# Patient Record
Sex: Male | Born: 1950
Health system: Southern US, Community
[De-identification: ages and names within clinical notes are randomized; demographics above are authoritative.]

## PROBLEM LIST (undated history)

## (undated) DIAGNOSIS — N429 Disorder of prostate, unspecified: Secondary | ICD-10-CM

## (undated) DIAGNOSIS — G9341 Metabolic encephalopathy: Secondary | ICD-10-CM

## (undated) DIAGNOSIS — R32 Unspecified urinary incontinence: Secondary | ICD-10-CM

## (undated) DIAGNOSIS — J45909 Unspecified asthma, uncomplicated: Secondary | ICD-10-CM

## (undated) DIAGNOSIS — R1310 Dysphagia, unspecified: Secondary | ICD-10-CM

## (undated) DIAGNOSIS — Z86718 Personal history of other venous thrombosis and embolism: Secondary | ICD-10-CM

## (undated) DIAGNOSIS — E785 Hyperlipidemia, unspecified: Secondary | ICD-10-CM

## (undated) DIAGNOSIS — I10 Essential (primary) hypertension: Secondary | ICD-10-CM

## (undated) DIAGNOSIS — E119 Type 2 diabetes mellitus without complications: Secondary | ICD-10-CM

## (undated) DIAGNOSIS — T7840XA Allergy, unspecified, initial encounter: Secondary | ICD-10-CM

## (undated) DIAGNOSIS — D509 Iron deficiency anemia, unspecified: Secondary | ICD-10-CM

## (undated) DIAGNOSIS — K219 Gastro-esophageal reflux disease without esophagitis: Secondary | ICD-10-CM

## (undated) DIAGNOSIS — I639 Cerebral infarction, unspecified: Secondary | ICD-10-CM

## (undated) HISTORY — PX: GASTROSTOMY TUBE PLACEMENT: SHX655

## (undated) HISTORY — DX: Disorder of prostate, unspecified: N42.9

## (undated) HISTORY — DX: Allergy, unspecified, initial encounter: T78.40XA

## (undated) HISTORY — DX: Cerebral infarction, unspecified: I63.9

## (undated) HISTORY — DX: Unspecified asthma, uncomplicated: J45.909

## (undated) HISTORY — DX: Unspecified urinary incontinence: R32

## (undated) HISTORY — DX: Type 2 diabetes mellitus without complications: E11.9

## (undated) HISTORY — DX: Personal history of other venous thrombosis and embolism: Z86.718

## (undated) HISTORY — DX: Hyperlipidemia, unspecified: E78.5

## (undated) HISTORY — DX: Essential (primary) hypertension: I10

---

## 2013-05-31 DIAGNOSIS — Z8673 Personal history of transient ischemic attack (TIA), and cerebral infarction without residual deficits: Secondary | ICD-10-CM | POA: Insufficient documentation

## 2013-05-31 DIAGNOSIS — I639 Cerebral infarction, unspecified: Secondary | ICD-10-CM

## 2013-05-31 HISTORY — DX: Cerebral infarction, unspecified: I63.9

## 2016-09-25 ENCOUNTER — Ambulatory Visit (INDEPENDENT_AMBULATORY_CARE_PROVIDER_SITE_OTHER): Payer: Medicare Other | Admitting: Nurse Practitioner

## 2016-09-25 ENCOUNTER — Encounter: Payer: Self-pay | Admitting: Nurse Practitioner

## 2016-09-25 VITALS — BP 133/82 | HR 77 | Temp 98.3°F | Resp 16 | Ht 66.0 in | Wt 158.0 lb

## 2016-09-25 DIAGNOSIS — E118 Type 2 diabetes mellitus with unspecified complications: Secondary | ICD-10-CM

## 2016-09-25 DIAGNOSIS — R351 Nocturia: Secondary | ICD-10-CM | POA: Diagnosis not present

## 2016-09-25 DIAGNOSIS — I872 Venous insufficiency (chronic) (peripheral): Secondary | ICD-10-CM | POA: Diagnosis not present

## 2016-09-25 DIAGNOSIS — Z7689 Persons encountering health services in other specified circumstances: Secondary | ICD-10-CM

## 2016-09-25 DIAGNOSIS — N401 Enlarged prostate with lower urinary tract symptoms: Secondary | ICD-10-CM | POA: Diagnosis not present

## 2016-09-25 DIAGNOSIS — I10 Essential (primary) hypertension: Secondary | ICD-10-CM | POA: Diagnosis not present

## 2016-09-25 LAB — POCT GLYCOSYLATED HEMOGLOBIN (HGB A1C): Hemoglobin A1C: 6.3

## 2016-09-25 MED ORDER — AMLODIPINE BESYLATE 10 MG PO TABS: 10.0000 mg | ORAL_TABLET | Freq: Every day | ORAL | 0 refills | Status: DC

## 2016-09-25 MED ORDER — TAMSULOSIN HCL 0.4 MG PO CAPS: 0.4000 mg | ORAL_CAPSULE | Freq: Every day | ORAL | 0 refills | Status: DC

## 2016-09-25 NOTE — Progress Notes (Deleted)
Subjective:    Patient ID: Shane Sloan, male    DOB: 05-12-1951, 66 y.o.   MRN: [id number]  Shane Sloan is a 66 y.o. male presenting on 09/25/2016 for Establish Care (Rx refill and arm and leg pain on right side onset few weeks)   HPI  Not noticed in fingers or left foot.   Diabetes   Coffee.  Only a small amount of honey.  Twice per day.  Fruit for breakfast and/or juice.    Hard boiled egg.  When working eats once per day:  Noon.    Soup in the morning  (Egg drop soup broth and eggs).  Blurred vision (esp. When driving even with eye glasses).  No polys. Fatigue.  No dizziness, diaphoresis, shaky.  Metformin is tolerated well without side effects.  No missed doses.    Past Medical History:  Diagnosis Date  . Allergy   . Asthma   . Diabetes mellitus without complication (HCC)   . H/O blood clots   . Hyperlipidemia   . Hypertension   . Prostate disease   . Stroke (HCC) 05/31/2013  . Urinary incontinence    History reviewed. No pertinent surgical history. Social History   Social History  . Marital status: Widowed    Spouse name: N/A  . Number of children: N/A  . Years of education: N/A   Occupational History  . Not on file.   Social History Main Topics  . Smoking status: Never Smoker  . Smokeless tobacco: Never Used  . Alcohol use No  . Drug use: No  . Sexual activity: Not on file   Other Topics Concern  . Not on file   Social History Narrative  . No narrative on file   History reviewed. No pertinent family history. No current outpatient prescriptions on file prior to visit.   No current facility-administered medications on file prior to visit.     Review of Systems Per HPI unless specifically indicated above  ***New Patient ***Level 3 - HPI 4, ROS 2, PFSH 1 - Low MDM - 30 min ***Level 4 - HPI 4, ROS 10***, PFSH 3, Moderate MDM - 45 min     Objective:    BP 133/82   Pulse 77   Temp 98.3 F (36.8 C) (Oral)   Resp 16   Ht 5\' 6"   (1.676 m)   Wt 158 lb (71.7 kg)   SpO2 100%   BMI 25.50 kg/m   Wt Readings from Last 3 Encounters:  09/25/16 158 lb (71.7 kg)    Physical Exam*** No results found for this or any previous visit.    Assessment & Plan:   Problem List Items Addressed This Visit    None      Meds ordered this encounter  Medications  . atorvastatin (LIPITOR) 20 MG tablet    Sig: Take 20 mg by mouth daily.  Marland Kitchen amLODipine (NORVASC) 10 MG tablet    Sig: Take 10 mg by mouth daily.  Marland Kitchen aspirin 325 MG tablet    Sig: Take 325 mg by mouth daily.  . tamsulosin (FLOMAX) 0.4 MG CAPS capsule    Sig: Take 0.4 mg by mouth daily.  . metFORMIN (GLUCOPHAGE) 500 MG tablet    Sig: Take 500 mg by mouth 2 (two) times daily with a meal.  . finasteride (PROSCAR) 5 MG tablet    Sig: Take 5 mg by mouth daily.  Marland Kitchen lubiprostone (AMITIZA) 24 MCG capsule    Sig: Take 24 mcg  by mouth 2 (two) times daily with a meal.      Follow up plan: No Follow-up on file.  Shane McardleLauren Jahari Billy, DNP, AGNP-BC Adult Gerontology Nurse Practitioner [facility] 09/25/2016, 11:07 AM

## 2016-09-25 NOTE — Patient Instructions (Signed)
[place], Thank you for coming in to clinic today.  1. For your leg pain and tingling.  I see some lower leg swelling and varicose veins and chronic venous insufficiency.  I think this is why your feet hurt and tingle.   - Please go to [facility] and take the prescription for compression stockings.  These are tight socks that help keep blood going back up to your heart and prevent swelling in your legs. - Wear these during the day.  You may take them off at night when you are in bed. 2. For your diabetes:  Your goal is a Hemoglobin A1C of less than 6.5.  Your hemoglobin A1C was 6.3 in clinic today and you are at goal. - Continue taking metformin 500 mg twice per day. - Follow up in 3 months  Please schedule a follow-up appointment with Wilhelmina Mcardle, AGNP in 1 month for blood pressure and high cholesterol and 3 months for diabetes, blood pressure, and high cholesterol.  If you have any other questions or concerns, please feel free to call the clinic or send a message through MyChart. You may also schedule an earlier appointment if necessary.  Wilhelmina Mcardle, DNP, AGNP-BC Adult Gerontology Nurse Practitioner [facility]  Hemoglobin A1c Test Some of the sugar (glucose) that circulates in your blood sticks or binds to blood proteins. Hemoglobin (Hb or Hgb) is one type of blood protein that glucose binds to. It also carries oxygen in the red blood cells (RBCs). When glucose binds to Hb, the glucose-coated Hb is called glycated Hb. Once Hb is glycated, it remains that way for the life of the RBC. This is about 120 days. Rather than testing your blood glucose level on one single day, the hemoglobin A1c (HbA1c) test measures the average amount of glycated hemoglobin and, therefore, the average amount of glucose in your blood during the 3-4 months just before the test is done. The HbA1c test is used to monitor long-term control of blood sugar in people who have diabetes mellitus.  The HbA1c test can also be used in addition to or in combination with fasting blood glucose level and oral glucose tolerance tests. What do the results mean? It is your responsibility to obtain your test results. Ask the lab or department performing the test when and how you will get your results. Contact your health care provider to discuss any questions you have about your results. Range of Normal Values  Ranges for normal values may vary among different labs and hospitals. You should always check with your health care provider after having lab work or other tests done to discuss the meaning of your test results and whether your values are considered within normal limits. The ranges for normal HbA1c test results are as follows:  Adult or child without diabetes: 4-5.9%.  Adult or child with diabetes and good blood glucose control: less than 6.5%. Several factors can affect HbA1c test results. These may include:  Diseases (hemoglobinopathies) that cause a change in the shape, size, or amount of Hb in your blood.  Longer than normal RBC life span.  Abnormally low levels of certain proteins in your blood.  Eating foods or taking supplements that are high in vitamin C (ascorbic acid). Meaning of Results Outside Normal Value Ranges  Abnormally high HbA1c values are most commonly an indication of prediabetes mellitus and diabetes mellitus:  An HbA1c result of 5.7-6.4% is considered diagnostic of prediabetes mellitus.  An HbA1c result of 6.5%  or higher on two separate occasions is considered diagnostic of diabetes mellitus. Abnormally low HbA1c values can be caused by several health conditions. These may include:  Pregnancy.  A large amount of blood loss.  Blood transfusions.  Low red blood cell count (anemia). This is caused by premature destruction of red blood cells.  Long-term kidney failure.  Some unusual forms of Hb (Hb variants), such as sickle cell trait. Discuss your test  results with your health care provider. He or she will use the results to make a diagnosis and determine a treatment plan that is right for you. Talk with your health care provider to discuss your results, treatment options, and if necessary, the need for more tests. Talk with your health care provider if you have any questions about your results. This information is not intended to replace advice given to you by your health care provider. Make sure you discuss any questions you have with your health care provider. Document Released: 07/14/2004 Document Revised: 03/18/2016 Document Reviewed: 11/06/2013 Elsevier Interactive Patient Education  2017 Elsevier Inc.   Chronic Venous Insufficiency Chronic venous insufficiency, also called venous stasis, is a condition that prevents blood from being pumped effectively through the veins in your legs. Blood may no longer be pumped effectively from the legs back to the heart. This condition can range from mild to severe. With proper treatment, you should be able to continue with an active life. What are the causes? Chronic venous insufficiency occurs when the vein walls become stretched, weakened, or damaged, or when valves within the vein are damaged. Some common causes of this include:  High blood pressure inside the veins (venous hypertension).  Increased blood pressure in the leg veins from long periods of sitting or standing.  A blood clot that blocks blood flow in a vein (deep vein thrombosis, DVT).  Inflammation of a vein (phlebitis) that causes a blood clot to form.  Tumors in the pelvis that cause blood to back up. What increases the risk? The following factors may make you more likely to develop this condition:  Having a family history of this condition.  Obesity.  Pregnancy.  Living without enough physical activity or exercise (sedentary lifestyle).  Smoking.  Having a job that requires long periods of standing or sitting in one  place.  Being a certain age. Women in their 51s and 42s and men in their 74s are more likely to develop this condition. What are the signs or symptoms? Symptoms of this condition include:  Veins that are enlarged, bulging, or twisted (varicose veins).  Skin breakdown or ulcers.  Reddened or discolored skin on the front of the leg.  Brown, smooth, tight, and painful skin just above the ankle, usually on the inside of the leg (lipodermatosclerosis).  Swelling. How is this diagnosed? This condition may be diagnosed based on:  Your medical history.  A physical exam.  Tests, such as:  A procedure that creates an image of a blood vessel and nearby organs and provides information about blood flow through the blood vessel (duplex ultrasound).  A procedure that tests blood flow (plethysmography).  A procedure to look at the veins using X-ray and dye (venogram). How is this treated? The goals of treatment are to help you return to an active life and to minimize pain or disability. Treatment depends on the severity of your condition, and it may include:  Wearing compression stockings. These can help relieve symptoms and help prevent your condition from getting worse. However,  they do not cure the condition.  Sclerotherapy. This is a procedure involving an injection of a material that "dissolves" damaged veins.  Surgery. This may involve:  Removing a diseased vein (vein stripping).  Cutting off blood flow through the vein (laser ablation surgery).  Repairing a valve. Follow these instructions at home:  Wear compression stockings as told by your health care provider. These stockings help to prevent blood clots and reduce swelling in your legs.  Take over-the-counter and prescription medicines only as told by your health care provider.  Stay active by exercising, walking, or doing different activities. Ask your health care provider what activities are safe for you and how much  exercise you need.  Drink enough fluid to keep your urine clear or pale yellow.  Do not use any products that contain nicotine or tobacco, such as cigarettes and e-cigarettes. If you need help quitting, ask your health care provider.  Keep all follow-up visits as told by your health care provider. This is important. Contact a health care provider if:  You have redness, swelling, or more pain in the affected area.  You see a red streak or line that extends up or down from the affected area.  You have skin breakdown or a loss of skin in the affected area, even if the breakdown is small.  You get an injury in the affected area. Get help right away if:  You get an injury and an open wound in the affected area.  You have severe pain that does not get better with medicine.  You have sudden numbness or weakness in the foot or ankle below the affected area, or you have trouble moving your foot or ankle.  You have a fever and you have worse or persistent symptoms.  You have chest pain.  You have shortness of breath. Summary  Chronic venous insufficiency, also called venous stasis, is a condition that prevents blood from being pumped effectively through the veins in your legs.  Chronic venous insufficiency occurs when the vein walls become stretched, weakened, or damaged, or when valves within the vein are damaged.  Treatment for this condition depends on how severe your condition is, and it may involve wearing compression stockings or having a procedure.  Make sure you stay active by exercising, walking, or doing different activities. Ask your health care provider what activities are safe for you and how much exercise you need. This information is not intended to replace advice given to you by your health care provider. Make sure you discuss any questions you have with your health care provider. Document Released: 2020/09/3006 Document Revised: 05/11/2016 Document Reviewed:  05/11/2016 Elsevier Interactive Patient Education  2017 Elsevier Inc.  Varicose Veins Varicose veins are veins that have become enlarged and twisted. They are usually seen in the legs but can occur in other parts of the body as well. What are the causes? This condition is the result of valves in the veins not working properly. Valves in the veins help to return blood from the leg to the heart. If these valves are damaged, blood flows backward and backs up into the veins in the leg near the skin. This causes the veins to become larger. What increases the risk? People who are on their feet a lot, who are pregnant, or who are overweight are more likely to develop varicose veins. What are the signs or symptoms?  Bulging, twisted-appearing, bluish veins, most commonly found on the legs.  Leg pain or a feeling  of heaviness. These symptoms may be worse at the end of the day.  Leg swelling.  Changes in skin color. How is this diagnosed? A health care provider can usually diagnose varicose veins by examining your legs. Your health care provider may also recommend an ultrasound of your leg veins. How is this treated? Most varicose veins can be treated at home.However, other treatments are available for people who have persistent symptoms or want to improve the cosmetic appearance of the varicose veins. These treatment options include:  Sclerotherapy. A solution is injected into the vein to close it off.  Laser treatment. A laser is used to heat the vein to close it off.  Radiofrequency vein ablation. An electrical current produced by radio waves is used to close off the vein.  Phlebectomy. The vein is surgically removed through small incisions made over the varicose vein.  Vein ligation and stripping. The vein is surgically removed through incisions made over the varicose vein after the vein has been tied (ligated). Follow these instructions at home:   Do not stand or sit in one position  for long periods of time. Do not sit with your legs crossed. Rest with your legs raised during the day.  Wear compression stockings as directed by your health care provider. These stockings help to prevent blood clots and reduce swelling in your legs.  Do not wear other tight, encircling garments around your legs, pelvis, or waist.  Walk as much as possible to increase blood flow.  Raise the foot of your bed at night with 2-inch blocks.  If you get a cut in the skin over the vein and the vein bleeds, lie down with your leg raised and press on it with a clean cloth until the bleeding stops. Then place a bandage (dressing) on the cut. See your health care provider if it continues to bleed. Contact a health care provider if:  The skin around your ankle starts to break down.  You have pain, redness, tenderness, or hard swelling in your leg over a vein.  You are uncomfortable because of leg pain. This information is not intended to replace advice given to you by your health care provider. Make sure you discuss any questions you have with your health care provider. Document Released: 04/01/2005 Document Revised: 11/28/2015 Document Reviewed: 12/24/2015 Elsevier Interactive Patient Education  2017 [employer].

## 2016-09-25 NOTE — Progress Notes (Signed)
Subjective:    Patient ID: Shane Sloan, male    DOB: 09/22/1950, 66 y.o.   MRN: [id number]  Shane Sloan is a 66 y.o. male presenting on 09/25/2016 for Establish Care (Rx refill and arm and leg pain on right side onset few weeks)   HPI   Right foot pain and tingling [name] notices right foot pain described as burning, tingling, and numbness that worsens through the day and worst in the afternoon and evening.  When he wakes up it is gone.  Burning/tingling, numbness.  Started in January and was at a pain level of 3/10.  Now the pain at its worst is a 4/10.  The pain does not radiate.  He has not noticed any in his fingers or left foot.  Diabetes Diet recall is difficult to ascertain, however, typical meals were reviewed.  He drinks two cups of coffee each day and uses only a small amount of honey to sweeten.  For breakfast he typically eats fruit for breakfast and/or juice and a hard boiled egg or egg drop soup.  He focused on describing that he eats lots of fruit and bananas.  He eats some vegetables and protein, but cannot tell me what eats for lunch or dinner other than lots of fruit.  He admits to some blurred vision, especially when driving even with eye glasses.  He has no polydipsia, polyphagia, or polyuria, fatigue, dizziness, diaphoresis, or shakiness.  Metformin is tolerated well without GI side effects of gas and diarrhea.  No admittance to missed doses.   Past Medical History:  Diagnosis Date  . Allergy   . Asthma   . Diabetes mellitus without complication (HCC)   . H/O blood clots   . Hyperlipidemia   . Hypertension   . Prostate disease   . Stroke (HCC) 05/31/2013  . Urinary incontinence    History reviewed. No pertinent surgical history. Social History   Social History  . Marital status: Widowed    Spouse name: N/A  . Number of children: N/A  . Years of education: N/A   Occupational History  . Not on file.   Social History Main Topics  . Smoking  status: Never Smoker  . Smokeless tobacco: Never Used  . Alcohol use No  . Drug use: No  . Sexual activity: Not on file   Other Topics Concern  . Not on file   Social History Narrative  . No narrative on file   History reviewed. No pertinent family history. No current outpatient prescriptions on file prior to visit.   No current facility-administered medications on file prior to visit.     Review of Systems Per HPI unless specifically indicated above    Objective:    BP 133/82   Pulse 77   Temp 98.3 F (36.8 C) (Oral)   Resp 16   Ht 5\' 6"  (1.676 m)   Wt 158 lb (71.7 kg)   SpO2 100%   BMI 25.50 kg/m   Wt Readings from Last 3 Encounters:  09/25/16 158 lb (71.7 kg)    Physical Exam  Constitutional: He is oriented to person, place, and time. He appears well-developed and well-nourished. No distress.  HENT:  Head: Normocephalic and atraumatic.  Right Ear: Tympanic membrane, external ear and ear canal normal.  Cerumen obstructs view in L ear canal.  Cerumen in R ear.  Eyes: Pupils are equal, round, and reactive to light.  Limited fundoscopic exam normal.  Neck: Normal range of motion. Neck supple.  No JVD present. No tracheal deviation present. No thyromegaly present.  Cardiovascular: Normal rate, regular rhythm, normal heart sounds and intact distal pulses.   Bilateral pedal edema.  Venous stasis ulcer (healing) Lateral LE 2 inches above lateral malleolus.  Numerous bilateral varicose veins. +2 nonpitting edema, prominent sock-line edema  Pulmonary/Chest: Effort normal and breath sounds normal.  Musculoskeletal: Normal range of motion.       Right ankle: He exhibits normal range of motion, no swelling, no ecchymosis, no deformity, no laceration and normal pulse. No tenderness. Achilles tendon normal.  Lymphadenopathy:    He has no cervical adenopathy.  Neurological: He is alert and oriented to person, place, and time. He has normal strength and normal reflexes. No  cranial nerve deficit or sensory deficit. He displays a negative Romberg sign.  Expressive and receptive aphasia  Skin: Skin is warm and dry.     Psychiatric: He has a normal mood and affect. His behavior is normal. Judgment and thought content normal. Cognition and memory are normal.   Results for orders placed or performed in visit on 09/25/16  POCT HgB A1C  Result Value Ref Range   Hemoglobin A1C 6.3%       Assessment & Plan:   Problem List Items Addressed This Visit    None    Visit Diagnoses    Encounter to establish care with new doctor    -  Primary   Type 2 diabetes mellitus with complication, without long-term current use of insulin (HCC)       1. Continue atorvastatin (LIPITOR) 20 MG tablet one tablet daily.   2. Continue aspirin 325 MG tablet one tablet daily - indication: s/p CVA   3. Reorder and continue metFORMIN (GLUCOPHAGE) 500 MG tablet one tablet BID   4. POCT HgB A1C (Completed) - HgB A1C at goal.  No medication changes at this time.   5. Ambulatory referral to diabetic education - Nutritional education for carbohydrates. Poor health literacy related to diet.    Venous insufficiency of both lower extremities       Relevant Medications   atorvastatin (LIPITOR) 20 MG tablet   aspirin 325 MG tablet   amLODipine (NORVASC) 10 MG tablet   Other Relevant Orders   1. Compression stockings - apply to bilateral lower extremities each morning and wear throughout the day for venous insufficiency and pain in feet.  May remove at night. 2. If pain persists after 1 month of consistent use, will evaluate treatment plan at next visit.    Essential hypertension       Relevant Medications   atorvastatin (LIPITOR) 20 MG tablet   aspirin 325 MG tablet   amLODipine (NORVASC) 10 MG tablet 1. BP stable today.  Reorder medications for continuity.   2. Follow-up for full BP evaluation in 1 month.   BPH associated with nocturia       Relevant Medications   finasteride  (PROSCAR) 5 MG tablet   tamsulosin (FLOMAX) 0.4 MG CAPS capsule - Refilled r/t patient out of supply      Meds ordered this encounter  Medications  . atorvastatin (LIPITOR) 20 MG tablet    Sig: Take 20 mg by mouth daily.  Marland Kitchen. DISCONTD: amLODipine (NORVASC) 10 MG tablet    Sig: Take 10 mg by mouth daily.  Marland Kitchen. aspirin 325 MG tablet    Sig: Take 325 mg by mouth daily.  Marland Kitchen. DISCONTD: tamsulosin (FLOMAX) 0.4 MG CAPS capsule    Sig: Take 0.4 mg by  mouth daily.  . metFORMIN (GLUCOPHAGE) 500 MG tablet    Sig: Take 500 mg by mouth 2 (two) times daily with a meal.  . finasteride (PROSCAR) 5 MG tablet    Sig: Take 5 mg by mouth daily.  Marland Kitchen lubiprostone (AMITIZA) 24 MCG capsule    Sig: Take 24 mcg by mouth 2 (two) times daily with a meal.  . amLODipine (NORVASC) 10 MG tablet    Sig: Take 1 tablet (10 mg total) by mouth daily.    Dispense:  90 tablet    Refill:  0    Order Specific Question:   Supervising Provider    Answer:   Smitty Cords [[id number]]  . tamsulosin (FLOMAX) 0.4 MG CAPS capsule    Sig: Take 1 capsule (0.4 mg total) by mouth daily. After the same meal each day.    Dispense:  90 capsule    Refill:  0    Order Specific Question:   Supervising Provider    Answer:   Smitty Cords [[id number]]      Follow up plan: Return in about 1 month (around 10/26/2016) for Blood pressure and high cholesterol.  Follow up in 3 months for DM, BP, and  HLD.  Wilhelmina Mcardle, DNP, AGNP-BC Adult Gerontology Nurse Practitioner [facility] 09/25/2016, 12:00 PM

## 2016-09-25 NOTE — Progress Notes (Signed)
I have reviewed this encounter including the documentation in this note and/or discussed this patient with the provider, Lauren Kennedy, AGPCNP-BC. I am certifying that I agree with the content of this note as supervising physician.  Alexander Karamalegos, DO [facility] 09/25/2016, 5:22 PM 

## 2016-10-16 ENCOUNTER — Other Ambulatory Visit: Payer: Self-pay | Admitting: Nurse Practitioner

## 2016-10-16 MED ORDER — METFORMIN HCL 500 MG PO TABS: 500.0000 mg | ORAL_TABLET | Freq: Two times a day (BID) | ORAL | 0 refills | Status: DC

## 2016-10-16 MED ORDER — ATORVASTATIN CALCIUM 20 MG PO TABS: 20.0000 mg | ORAL_TABLET | Freq: Every day | ORAL | 0 refills | Status: DC

## 2016-10-16 NOTE — Telephone Encounter (Signed)
Prescriptions sent to [facility].  Please verify pharmacy with patient.  Also, remind him of his appointment on 4/27.  I will not prescribe these again until he returns.  His last visit was only to establish care and we didn't go over all his medical conditions well.

## 2016-10-16 NOTE — Telephone Encounter (Signed)
Pt.  Is requesting a refill on Metformin 500 mg, Atorvastatin 20 mg   Pt. Call back # [phone].

## 2016-10-16 NOTE — Telephone Encounter (Signed)
Left detailed message.   

## 2016-10-30 ENCOUNTER — Ambulatory Visit: Payer: Medicare Other | Admitting: Nurse Practitioner

## 2016-11-04 ENCOUNTER — Encounter: Payer: Self-pay | Admitting: Nurse Practitioner

## 2016-11-04 ENCOUNTER — Ambulatory Visit (INDEPENDENT_AMBULATORY_CARE_PROVIDER_SITE_OTHER): Payer: Medicare HMO | Admitting: Nurse Practitioner

## 2016-11-04 VITALS — BP 118/73 | HR 80 | Temp 98.7°F | Resp 16 | Ht 66.0 in | Wt 159.0 lb

## 2016-11-04 DIAGNOSIS — N401 Enlarged prostate with lower urinary tract symptoms: Secondary | ICD-10-CM | POA: Diagnosis not present

## 2016-11-04 DIAGNOSIS — E1143 Type 2 diabetes mellitus with diabetic autonomic (poly)neuropathy: Secondary | ICD-10-CM | POA: Insufficient documentation

## 2016-11-04 DIAGNOSIS — J45909 Unspecified asthma, uncomplicated: Secondary | ICD-10-CM | POA: Insufficient documentation

## 2016-11-04 DIAGNOSIS — E785 Hyperlipidemia, unspecified: Secondary | ICD-10-CM | POA: Diagnosis not present

## 2016-11-04 DIAGNOSIS — E1149 Type 2 diabetes mellitus with other diabetic neurological complication: Secondary | ICD-10-CM | POA: Diagnosis not present

## 2016-11-04 DIAGNOSIS — R351 Nocturia: Secondary | ICD-10-CM | POA: Diagnosis not present

## 2016-11-04 DIAGNOSIS — I10 Essential (primary) hypertension: Secondary | ICD-10-CM

## 2016-11-04 DIAGNOSIS — Z86718 Personal history of other venous thrombosis and embolism: Secondary | ICD-10-CM | POA: Insufficient documentation

## 2016-11-04 LAB — COMPLETE METABOLIC PANEL WITH GFR
ALT: 21 U/L (ref 9–46)
AST: 21 U/L (ref 10–35)
Albumin: 4.2 g/dL (ref 3.6–5.1)
Alkaline Phosphatase: 46 U/L (ref 40–115)
BUN: 9 mg/dL (ref 7–25)
CO2: 24 mmol/L (ref 20–31)
Calcium: 9 mg/dL (ref 8.6–10.3)
Chloride: 103 mmol/L (ref 98–110)
Creat: 0.91 mg/dL (ref 0.70–1.25)
GFR, Est African American: >89 mL/min (ref >=60)
GFR, Est Non African American: 88 mL/min (ref >=60)
Glucose, Bld: 100 mg/dL — ABNORMAL HIGH (ref 65–99)
Potassium: 4.1 mmol/L (ref 3.5–5.3)
Sodium: 138 mmol/L (ref 135–146)
Total Bilirubin: 0.6 mg/dL (ref 0.2–1.2)
Total Protein: 7.1 g/dL (ref 6.1–8.1)

## 2016-11-04 LAB — LIPID PANEL
Cholesterol: 121 mg/dL (ref <200)
HDL: 57 mg/dL (ref >40)
LDL Cholesterol: 41 mg/dL (ref <100)
Total CHOL/HDL Ratio: 2.1 Ratio (ref <5.0)
Triglycerides: 115 mg/dL (ref <150)
VLDL: 23 mg/dL (ref <30)

## 2016-11-04 MED ORDER — ATORVASTATIN CALCIUM 20 MG PO TABS: 20.0000 mg | ORAL_TABLET | Freq: Every day | ORAL | 0 refills | Status: DC

## 2016-11-04 MED ORDER — BLOOD GLUCOSE MONITOR KIT: PACK | 0 refills | Status: DC

## 2016-11-04 MED ORDER — FINASTERIDE 5 MG PO TABS: 5.0000 mg | ORAL_TABLET | Freq: Every day | ORAL | 0 refills | Status: DC

## 2016-11-04 MED ORDER — AMLODIPINE BESYLATE 10 MG PO TABS: 10.0000 mg | ORAL_TABLET | Freq: Every day | ORAL | 1 refills | Status: DC

## 2016-11-04 MED ORDER — METFORMIN HCL 500 MG PO TABS: 500.0000 mg | ORAL_TABLET | Freq: Two times a day (BID) | ORAL | 0 refills | Status: DC

## 2016-11-04 NOTE — Progress Notes (Signed)
I have reviewed this encounter including the documentation in this note and/or discussed this patient with the provider, Wilhelmina Mcardle, AGPCNP-BC. I am certifying that I agree with the content of this note as supervising physician.  Saralyn Pilar, DO [facility] 11/04/2016, 6:30 PM

## 2016-11-04 NOTE — Patient Instructions (Signed)
Shane Sloan, Thank you for coming in to clinic today.  1. For your diabetes, - continue your medications with no changes. - Start checking your blood sugar at home.  Always check one in the morning before you eat anything. Then check it before any other meal of the day.  2. For your other medical conditions: we are not making any changes.  I have refilled your medicines for 90-day supplies.  Call us if you run out of anything before your next appointment.  Please schedule a follow-up appointment with Wilhelmina Mcardle, AGNP in 6 months.  If you have any other questions or concerns, please feel free to call the clinic or send a message through MyChart. You may also schedule an earlier appointment if necessary.  Wilhelmina Mcardle, DNP, AGNP-BC Adult Gerontology Nurse Practitioner [facility], [facility]     Blood Glucose Monitoring, Adult Monitoring your blood sugar (glucose) helps you manage your diabetes. It also helps you and your health care provider determine how well your diabetes management plan is working. Blood glucose monitoring involves checking your blood glucose as often as directed, and keeping a record (log) of your results over time. Why should I monitor my blood glucose? Checking your blood glucose regularly can:  Help you understand how food, exercise, illnesses, and medicines affect your blood glucose.  Let you know what your blood glucose is at any time. You can quickly tell if you are having low blood glucose (hypoglycemia) or high blood glucose (hyperglycemia).  Help you and your health care provider adjust your medicines as needed. When should I check my blood glucose? Follow instructions from your health care provider about how often to check your blood glucose. This may depend on:  The type of diabetes you have.  How well-controlled your diabetes is.  Medicines you are taking. If you have type 1 diabetes:   Check your blood glucose at least 2 times a  day.  Also check your blood glucose:  Before every insulin injection.  Before and after exercise.  Between meals.  2 hours after a meal.  Occasionally between 2:00 a.m. and 3:00 a.m., as directed.  Before potentially dangerous tasks, like driving or using heavy machinery.  At bedtime.  You may need to check your blood glucose more often, up to 6-10 times a day:  If you use an insulin pump.  If you need multiple daily injections (MDI).  If your diabetes is not well-controlled.  If you are ill.  If you have a history of severe hypoglycemia.  If you have a history of not knowing when your blood glucose is getting low (hypoglycemia unawareness). If you have type 2 diabetes:   If you take insulin or other diabetes medicines, check your blood glucose at least 2 times a day.  If you are on intensive insulin therapy, check your blood glucose at least 4 times a day. Occasionally, you may also need to check between 2:00 a.m. and 3:00 a.m., as directed.  Also check your blood glucose:  Before and after exercise.  Before potentially dangerous tasks, like driving or using heavy machinery.  You may need to check your blood glucose more often if:  Your medicine is being adjusted.  Your diabetes is not well-controlled.  You are ill. What is a blood glucose log?  A blood glucose log is a record of your blood glucose readings. It helps you and your health care provider:  Look for patterns in your blood glucose over time.  Adjust your diabetes management plan as needed.  Every time you check your blood glucose, write down your result and notes about things that may be affecting your blood glucose, such as your diet and exercise for the day.  Most glucose meters store a record of glucose readings in the meter. Some meters allow you to download your records to a computer. How do I check my blood glucose? Follow these steps to get accurate readings of your blood  glucose: Supplies needed    Blood glucose meter.  Test strips for your meter. Each meter has its own strips. You must use the strips that come with your meter.  A needle to prick your finger (lancet). Do not use lancets more than once.  A device that holds the lancet (lancing device).  A journal or log book to write down your results. Procedure   Wash your hands with soap and water.  Prick the side of your finger (not the tip) with the lancet. Use a different finger each time.  Gently rub the finger until a small drop of blood appears.  Follow instructions that come with your meter for inserting the test strip, applying blood to the strip, and using your blood glucose meter.  Write down your result and any notes. Alternative testing sites   Some meters allow you to use areas of your body other than your finger (alternative sites) to test your blood.  If you think you may have hypoglycemia, or if you have hypoglycemia unawareness, do not use alternative sites. Use your finger instead.  Alternative sites may not be as accurate as the fingers, because blood flow is slower in these areas. This means that the result you get may be delayed, and it may be different from the result that you would get from your finger.  The most common alternative sites are:  Forearm.  Thigh.  Palm of the hand. Additional tips   Always keep your supplies with you.  If you have questions or need help, all blood glucose meters have a 24-hour "hotline" number that you can call. You may also contact your health care provider.  After you use a few boxes of test strips, adjust (calibrate) your blood glucose meter by following instructions that came with your meter. This information is not intended to replace advice given to you by your health care provider. Make sure you discuss any questions you have with your health care provider. Document Released: 06/25/2003 Document Revised: 01/10/2016 Document  Reviewed: 12/02/2015 Elsevier Interactive Patient Education  2017 [employer].

## 2016-11-04 NOTE — Progress Notes (Signed)
Subjective:    Patient ID: Shane Sloan, male    DOB: 1951/02/23, 66 y.o.   MRN: [id number]  Shane Sloan is a 66 y.o. male presenting on 11/04/2016 for Hypertension   HPI He has eaten some fruit and vegetables this morning (apples and celery) so he could take his medicines.  He did not return for fasting labs after his last visit, so will continue with plans for labs nonfasting.  BLE edema He has not gotten compression socks, but states he has used a warm towel and that is helping his BLE edema.  He has also noted some improvement in foot pain and tingling.  Only a little tingling and numbness for short periods of time.  Diabetes He is not checking his blood glucose at home and requests the ability to do so.  He has been taking his metformin 500 mg one tablet twice daily.  He did run out of his metformin and atorvastatin this morning and needs a refill. He eats small amounts of food many times throughout the day, but does eat at least two larger meals with snacks.  He states he has not gone to the nutrition visit because he cannot pay for it.  Insurance does not cover it.  He has several books he has read about DM nutrition and feels comfortable with what he should eat.  Pt denies polydipsia, polyphagia, polyuria, headaches, diaphoresis, shakiness, chills, pain, numbness or tingling in extremities, and changes in vision.  He does have nocturia, but attributes that to drinking lots of water for his kidneys not r/t thirst.  Hypertension He is not taking his BP at home.  He notes that he is feeling much better now that he is on his medications and is taking them regularly. He does endorse some fatigue now that he has resumed his medications.  It is improving.  He does report occasional dizziness that occurs daily usually with moving from lying to standing.  He describes that the dizziness does resolve before he continues to move. Pt denies headache, lightheadedness, changes in vision, chest  tightness/pressure, palpitations, sudden loss of speech or loss of consiousness.   BPH He has been taking his medications regularly and has recently run out of his finasteride.  He does have nocturia at least once, sometimes twice per night.  He is not having any difficulty starting his stream of urine, urinary frequency, urinary urgency, or weak stream.  He does feel he is completely emptying his bladder after urination.   Physical activity: Mr. Cudd exercises at [employer] every week day for about 1 hour.  He states he really is trying hard to take care of himself because his siblings didn't and all died in their 10s.  Social History  Substance Use Topics  . Smoking status: Never Smoker  . Smokeless tobacco: Never Used  . Alcohol use No    Review of Systems Per HPI unless specifically indicated above     Objective:    BP 118/73 (BP Location: Left Arm, Patient Position: Sitting, Cuff Size: Large)   Pulse 80   Temp 98.7 F (37.1 C) (Oral)   Resp 16   Ht '5\' 6"'  (1.676 m)   Wt 159 lb (72.1 kg)   SpO2 100%   BMI 25.66 kg/m   Wt Readings from Last 3 Encounters:  11/04/16 159 lb (72.1 kg)  09/25/16 158 lb (71.7 kg)    Physical Exam  Constitutional: Vital signs are normal. He appears well-developed and well-nourished. No  distress.  HENT:  Head: Normocephalic and atraumatic.  Nose: Nose normal.  Mouth/Throat: Oropharynx is clear and moist. No oropharyngeal exudate.  Eyes: Conjunctivae and EOM are normal. Pupils are equal, round, and reactive to light.  Neck: Normal range of motion. Neck supple. No JVD present. No tracheal deviation present.  Negative carotid bruits  Cardiovascular: Normal rate, regular rhythm, normal heart sounds and intact distal pulses.   Pulmonary/Chest: Effort normal and breath sounds normal. No respiratory distress. He has no wheezes.  Abdominal: Soft. Bowel sounds are normal. He exhibits no distension. There is no tenderness.  Musculoskeletal:    Bilateral feet inspected.  Skin intact without lesions or callus.  DP/PT pulses intact. Trace non-pitting sock-line edema improved from last visit.  Varicose veins are less prominent.  Lymphadenopathy:    He has no cervical adenopathy.  Skin: Skin is warm and dry.  Psychiatric: He has a normal mood and affect. His behavior is normal. Judgment normal.  Delayed cognition (possible association to [place] [employer] Language)  Vitals reviewed.  Results for orders placed or performed in visit on 09/25/16  POCT HgB A1C  Result Value Ref Range   Hemoglobin A1C 6.3%         Assessment & Plan:   Problem List Items Addressed This Visit      Cardiovascular and Mediastinum   Hypertension Well controlled on medications.    Plan: 1. Continue amlodipine 10 mg tablet once daily. 2. Start checking home BP. 3. Check CMP and lipid panel.  Pt did not return for labs after last visit.  Do labs today despite non-fasting status.   Relevant Medications   atorvastatin (LIPITOR) 20 MG tablet   amLODipine (NORVASC) 10 MG tablet   Other Relevant Orders   COMPLETE METABOLIC PANEL WITH GFR   Lipid panel     Endocrine   Controlled type 2 diabetes mellitus with diabetic autonomic neuropathy, without long-term current use of insulin ([place]) - Primary Controlled at last visit with POCT HgbA1c.  No home CBG readings.  Pt was not able to attend nutrition visit ordered at last office visit.  Plan: 1. Dietary knowledge seems sufficient.  Reconsider nutrition if worsening DM control. 2. Continue metformin 500 mg one tablet twice daily. 3. Begin CBG check at home with fasting before breakfast and any other reading before lunch, dinner, or before bedtime. 4. Assess CMP and lipid panel for comorbidities.   Relevant Medications   metFORMIN (GLUCOPHAGE) 500 MG tablet   atorvastatin (LIPITOR) 20 MG tablet   blood glucose meter kit and supplies KIT   Other Relevant Orders   COMPLETE METABOLIC PANEL WITH GFR    Lipid panel     Genitourinary   BPH associated with nocturia Controlled to patient satisfaction at this time.  Plan: 1. Continue finasteride and tamulosin   Relevant Medications   finasteride (PROSCAR) 5 MG tablet     Other   Hyperlipidemia Unsure of current disease status.  Diet and HgbA1c are suggestive of probable control of lipids.  Plan: 1. Continue atorvastatin 20 mg tablet once daily. 2. Check lipid panel to assess for medication appropriateness.   Relevant Medications   atorvastatin (LIPITOR) 20 MG tablet   amLODipine (NORVASC) 10 MG tablet   Other Relevant Orders   COMPLETE METABOLIC PANEL WITH GFR   Lipid panel      Meds ordered this encounter  Medications  . metFORMIN (GLUCOPHAGE) 500 MG tablet    Sig: Take 1 tablet (500 mg total) by  mouth 2 (two) times daily with a meal.    Dispense:  180 tablet    Refill:  0    Order Specific Question:   Supervising Provider    Answer:   Olin Hauser [[id number]]  . finasteride (PROSCAR) 5 MG tablet    Sig: Take 1 tablet (5 mg total) by mouth daily.    Dispense:  90 tablet    Refill:  0    Order Specific Question:   Supervising Provider    Answer:   Olin Hauser [[id number]]  . atorvastatin (LIPITOR) 20 MG tablet    Sig: Take 1 tablet (20 mg total) by mouth daily.    Dispense:  90 tablet    Refill:  0    Order Specific Question:   Supervising Provider    Answer:   Olin Hauser [[id number]]  . amLODipine (NORVASC) 10 MG tablet    Sig: Take 1 tablet (10 mg total) by mouth daily.    Dispense:  90 tablet    Refill:  1    Order Specific Question:   Supervising Provider    Answer:   Olin Hauser [[id number]]  . blood glucose meter kit and supplies KIT    Sig: Dispense based on patient and insurance preference. Use up to four times daily as directed. (FOR ICD-9 250.00, 250.01).    Dispense:  1 each    Refill:  0    Order Specific Question:   Supervising Provider    Answer:   Olin Hauser [[id number]]    Order Specific Question:   Number of strips    Answer:   180    Order Specific Question:   Number of lancets    Answer:   200      Follow up plan: Return in about 6 months (around 05/07/2017) for DM, HTN, HLD, BPH.   Cassell Smiles, DNP, AGPCNP-BC Adult Gerontology Primary Care Nurse Practitioner [facility] 11/04/2016, 3:47 PM

## 2016-11-06 ENCOUNTER — Telehealth: Payer: Self-pay

## 2016-11-06 NOTE — Telephone Encounter (Signed)
Patient advised as below. Patient verbalizes understanding and is in agreement with treatment plan.  

## 2016-11-06 NOTE — Telephone Encounter (Signed)
-----   Message from Galen ManilaLauren Renee Kennedy, NP sent at 11/05/2016  4:58 PM EDT ----- Labs look great.   CMP is normal which means electrolytes, kidney and liver function are all normal. Lipid panel shows that his cholesterol is managed well by his medication.

## 2016-11-27 ENCOUNTER — Encounter: Payer: Self-pay | Admitting: Nurse Practitioner

## 2016-11-27 ENCOUNTER — Ambulatory Visit (INDEPENDENT_AMBULATORY_CARE_PROVIDER_SITE_OTHER): Payer: Medicare HMO | Admitting: Nurse Practitioner

## 2016-11-27 VITALS — BP 126/76 | HR 68 | Temp 98.6°F | Resp 16 | Wt 158.0 lb

## 2016-11-27 DIAGNOSIS — G589 Mononeuropathy, unspecified: Secondary | ICD-10-CM | POA: Diagnosis not present

## 2016-11-27 DIAGNOSIS — I69319 Unspecified symptoms and signs involving cognitive functions following cerebral infarction: Secondary | ICD-10-CM

## 2016-11-27 DIAGNOSIS — Z742 Need for assistance at home and no other household member able to render care: Secondary | ICD-10-CM | POA: Diagnosis not present

## 2016-11-27 DIAGNOSIS — R351 Nocturia: Secondary | ICD-10-CM

## 2016-11-27 DIAGNOSIS — N401 Enlarged prostate with lower urinary tract symptoms: Secondary | ICD-10-CM | POA: Diagnosis not present

## 2016-11-27 MED ORDER — FINASTERIDE 5 MG PO TABS: 5.0000 mg | ORAL_TABLET | Freq: Every day | ORAL | 1 refills | Status: DC

## 2016-11-27 MED ORDER — TAMSULOSIN HCL 0.4 MG PO CAPS: 0.4000 mg | ORAL_CAPSULE | Freq: Every day | ORAL | 3 refills | Status: DC

## 2016-11-27 MED ORDER — GABAPENTIN 100 MG PO CAPS: 100.0000 mg | ORAL_CAPSULE | Freq: Three times a day (TID) | ORAL | 1 refills | Status: DC

## 2016-11-27 NOTE — Patient Instructions (Addendum)
Shane Sloan, Thank you for coming in to clinic today.  1. You have nerve pain in your feet. - take gabapentin 100 mg up to three times daily for burning pain.  You can take 3 pills total during the day in separate doses or at one single dose at bedtime.   2. You are also having some possible cognitive deficits from your stroke.  I am recommending speech therapy for this.  Your neurological exam is normal, but it is obvious that you are having more trouble with remembering the right words at the right times.  Speech therapy can help you with this.   Please schedule a follow-up appointment with Wilhelmina McardleLauren Daphna Lafuente, AGNP to Return if symptoms worsen or fail to improve.  If you have any other questions or concerns, please feel free to call the clinic or send a message through MyChart. You may also schedule an earlier appointment if necessary.  Wilhelmina McardleLauren Ashanti Ratti, DNP, AGNP-BC Adult Gerontology Nurse Practitioner [facility], [facility]   Peripheral Neuropathy Peripheral neuropathy is a type of nerve damage. It affects nerves that carry signals between the spinal cord and other parts of the body. These are called peripheral nerves. With peripheral neuropathy, one nerve or a group of nerves may be damaged. What are the causes? Many things can damage peripheral nerves. For some people with peripheral neuropathy, the cause is unknown. Some causes include:  Diabetes. This is the most common cause of peripheral neuropathy.  Injury to a nerve.  Pressure or stress on a nerve that lasts a long time.  Too little vitamin B. Alcoholism can lead to this.  Infections.  Autoimmune diseases, such as multiple sclerosis and systemic lupus erythematosus.  Inherited nerve diseases.  Some medicines, such as cancer drugs.  Toxic substances, such as lead and mercury.  Too little blood flowing to the legs.  Kidney disease.  Thyroid disease. What are the signs or symptoms? Different people have  different symptoms. The symptoms you have will depend on which of your nerves is damaged. Common symptoms include:  Loss of feeling (numbness) in the feet and hands.  Tingling in the feet and hands.  Pain that burns.  Very sensitive skin.  Weakness.  Not being able to move a part of the body (paralysis).  Muscle twitching.  Clumsiness or poor coordination.  Loss of balance.  Not being able to control your bladder.  Feeling dizzy.  Sexual problems. How is this diagnosed? Peripheral neuropathy is a symptom, not a disease. Finding the cause of peripheral neuropathy can be hard. To figure that out, your health care provider will take a medical history and do a physical exam. A neurological exam will also be done. This involves checking things affected by your brain, spinal cord, and nerves (nervous system). For example, your health care provider will check your reflexes, how you move, and what you can feel. Other types of tests may also be ordered, such as:  Blood tests.  A test of the fluid in your spinal cord.  Imaging tests, such as CT scans or an MRI.  Electromyography (EMG). This test checks the nerves that control muscles.  Nerve conduction velocity tests. These tests check how fast messages pass through your nerves.  Nerve biopsy. A small piece of nerve is removed. It is then checked under a microscope. How is this treated?  Medicine is often used to treat peripheral neuropathy. Medicines may include:  Pain-relieving medicines. Prescription or over-the-counter medicine may be suggested.  Antiseizure medicine.  This may be used for pain.  Antidepressants. These also may help ease pain from neuropathy.  Lidocaine. This is a numbing medicine. You might wear a patch or be given a shot.  Mexiletine. This medicine is typically used to help control irregular heart rhythms.  Surgery. Surgery may be needed to relieve pressure on a nerve or to destroy a nerve that is  causing pain.  Physical therapy to help movement.  Assistive devices to help movement. Follow these instructions at home:  Only take over-the-counter or prescription medicines as directed by your health care provider. Follow the instructions carefully for any given medicines. Do not take any other medicines without first getting approval from your health care provider.  If you have diabetes, work closely with your health care provider to keep your blood sugar under control.  If you have numbness in your feet:  Check every day for signs of injury or infection. Watch for redness, warmth, and swelling.  Wear padded socks and comfortable shoes. These help protect your feet.  Do not do things that put pressure on your damaged nerve.  Do not smoke. Smoking keeps blood from getting to damaged nerves.  Avoid or limit alcohol. Too much alcohol can cause a lack of B vitamins. These vitamins are needed for healthy nerves.  Develop a good support system. Coping with peripheral neuropathy can be stressful. Talk to a mental health specialist or join a support group if you are struggling.  Follow up with your health care provider as directed. Contact a health care provider if:  You have new signs or symptoms of peripheral neuropathy.  You are struggling emotionally from dealing with peripheral neuropathy.  You have a fever. Get help right away if:  You have an injury or infection that is not healing.  You feel very dizzy or begin vomiting.  You have chest pain.  You have trouble breathing. This information is not intended to replace advice given to you by your health care provider. Make sure you discuss any questions you have with your health care provider. Document Released: 06/12/2002 Document Revised: 11/28/2015 Document Reviewed: 02/27/2013 Elsevier Interactive Patient Education  2017 [employer].
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
## 2016-11-30 DIAGNOSIS — G63 Polyneuropathy in diseases classified elsewhere: Secondary | ICD-10-CM | POA: Insufficient documentation

## 2016-11-30 DIAGNOSIS — I69319 Unspecified symptoms and signs involving cognitive functions following cerebral infarction: Secondary | ICD-10-CM | POA: Insufficient documentation
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
## 2016-11-30 NOTE — Assessment & Plan Note (Signed)
Pt requiring refills of medication for 90-day supply to assist in travel.

## 2016-11-30 NOTE — Assessment & Plan Note (Addendum)
Pt previously declined medication therapy.  Pt without prominent varicose veins or edema today.    Plan: 1. START gabapentin 100 mg tablet up to 3 times daily or at bedtime as single dose of 300 mg once. 2. Home Health referral for physical therapy r/t numbness in feet and slow timed up and go.

## 2016-12-01 ENCOUNTER — Telehealth: Payer: Self-pay | Admitting: Family Medicine

## 2016-12-01 NOTE — Progress Notes (Signed)
I have reviewed this encounter including the documentation in this note and/or discussed this patient with the provider, Wilhelmina McardleLauren Kennedy, AGPCNP-BC. I am certifying that I agree with the content of this note as supervising physician.  Saralyn PilarAlexander Karamalegos, DO [facility] 12/01/2016, 12:33 PM

## 2016-12-01 NOTE — Telephone Encounter (Signed)
Shane Sloan with [facility] said he tried to set up homehealth physical therapy services with pt but he was told he pt was too busy now and he would be out of the country until first week in August.  He will need a new referral when pt returns.  His call back number is [phone]

## 2016-12-01 NOTE — Telephone Encounter (Signed)
Spoke with Lem at [facility].  Confirmed that pt and daughter are aware of decision to delay PT until patient returns from the [place] in August.    Will place new referral at that time.

## 2017-02-18 ENCOUNTER — Other Ambulatory Visit: Payer: Self-pay

## 2017-02-18 ENCOUNTER — Telehealth: Payer: Self-pay | Admitting: Nurse Practitioner

## 2017-02-18 DIAGNOSIS — E1143 Type 2 diabetes mellitus with diabetic autonomic (poly)neuropathy: Secondary | ICD-10-CM

## 2017-02-18 DIAGNOSIS — I10 Essential (primary) hypertension: Secondary | ICD-10-CM

## 2017-02-18 DIAGNOSIS — E785 Hyperlipidemia, unspecified: Secondary | ICD-10-CM

## 2017-02-18 MED ORDER — ATORVASTATIN CALCIUM 20 MG PO TABS: 20.0000 mg | ORAL_TABLET | Freq: Every day | ORAL | 0 refills | Status: DC

## 2017-02-18 MED ORDER — METFORMIN HCL 500 MG PO TABS: 500.0000 mg | ORAL_TABLET | Freq: Two times a day (BID) | ORAL | 0 refills | Status: DC

## 2017-02-18 MED ORDER — GABAPENTIN 100 MG PO CAPS: 100.0000 mg | ORAL_CAPSULE | Freq: Three times a day (TID) | ORAL | 1 refills | Status: DC

## 2017-02-18 MED ORDER — AMLODIPINE BESYLATE 10 MG PO TABS: 10.0000 mg | ORAL_TABLET | Freq: Every day | ORAL | 1 refills | Status: DC

## 2017-02-18 MED ORDER — TAMSULOSIN HCL 0.4 MG PO CAPS: 0.4000 mg | ORAL_CAPSULE | Freq: Every day | ORAL | 3 refills | Status: DC

## 2017-02-18 NOTE — Telephone Encounter (Signed)
The prescriptions was sent to the pt pharmacy.

## 2017-02-18 NOTE — Telephone Encounter (Signed)
Pt called states that he need a refill on his metformin called in to [facility] in  [place]

## 2017-02-25 ENCOUNTER — Ambulatory Visit (INDEPENDENT_AMBULATORY_CARE_PROVIDER_SITE_OTHER): Payer: Medicare HMO | Admitting: Nurse Practitioner

## 2017-02-25 ENCOUNTER — Encounter: Payer: Self-pay | Admitting: Nurse Practitioner

## 2017-02-25 VITALS — BP 112/83 | HR 69 | Temp 97.8°F | Ht 66.0 in | Wt 158.6 lb

## 2017-02-25 DIAGNOSIS — E1143 Type 2 diabetes mellitus with diabetic autonomic (poly)neuropathy: Secondary | ICD-10-CM

## 2017-02-25 DIAGNOSIS — N401 Enlarged prostate with lower urinary tract symptoms: Secondary | ICD-10-CM

## 2017-02-25 DIAGNOSIS — R351 Nocturia: Secondary | ICD-10-CM

## 2017-02-25 DIAGNOSIS — G63 Polyneuropathy in diseases classified elsewhere: Secondary | ICD-10-CM | POA: Diagnosis not present

## 2017-02-25 DIAGNOSIS — I69319 Unspecified symptoms and signs involving cognitive functions following cerebral infarction: Secondary | ICD-10-CM | POA: Diagnosis not present

## 2017-02-25 MED ORDER — GABAPENTIN 300 MG PO CAPS: 300.0000 mg | ORAL_CAPSULE | Freq: Two times a day (BID) | ORAL | 1 refills | Status: DC

## 2017-02-25 MED ORDER — FINASTERIDE 5 MG PO TABS: 5.0000 mg | ORAL_TABLET | Freq: Every day | ORAL | 1 refills | Status: DC
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
## 2017-02-25 NOTE — Patient Instructions (Addendum)
Shane Sloan, Thank you for coming in to clinic today.  1. FOr your diabetes: - GREAT WORK!! Your a1c is 6.1% which means your blood sugar is averaging 140 with after dinner and fasting blood sugars.  This is at your goal for diabetes.    2. For your pain in your feet: Take gabapentin 300 mg twice per day.   - For your current bottle, take 3 tablets with each dose.   - When you get your new bottle of gabapentin, only take 1 tablet with each dose.  The new pills will be 300 mg tablets.   3. I will reorder your speech therapy and physical therapy evaluation.   Please schedule a follow-up appointment with Wilhelmina Mcardle, AGNP. Return in about 6 months (around 08/28/2017) for diabetes.  If you have any other questions or concerns, please feel free to call the clinic or send a message through MyChart. You may also schedule an earlier appointment if necessary.  You will receive a survey after today's visit either digitally by e-mail or paper by [employer]. Your experiences and feedback matter to [place].  Please respond so we know how we are doing as we provide care for you.   Wilhelmina Mcardle, DNP, AGNP-BC Adult Gerontology Nurse Practitioner [facility]

## 2017-02-26 ENCOUNTER — Ambulatory Visit: Payer: Medicare Other | Admitting: Nurse Practitioner

## 2017-02-26 DIAGNOSIS — I10 Essential (primary) hypertension: Secondary | ICD-10-CM | POA: Diagnosis not present

## 2017-02-26 DIAGNOSIS — J45909 Unspecified asthma, uncomplicated: Secondary | ICD-10-CM | POA: Diagnosis not present

## 2017-02-26 DIAGNOSIS — I69320 Aphasia following cerebral infarction: Secondary | ICD-10-CM | POA: Diagnosis not present

## 2017-02-26 DIAGNOSIS — E1142 Type 2 diabetes mellitus with diabetic polyneuropathy: Secondary | ICD-10-CM | POA: Diagnosis not present

## 2017-02-26 DIAGNOSIS — I69390 Apraxia following cerebral infarction: Secondary | ICD-10-CM | POA: Diagnosis not present
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
## 2017-03-01 NOTE — Assessment & Plan Note (Signed)
Controlled at last visit with POCT HgbA1c and A1c continues to improve.  No home CBG readings today.    Plan: 1. Dietary knowledge sufficient.  Encouraged healthy diet. 2. Continue metformin 500 mg one tablet twice daily. 3. Begin CBG check at home with fasting before breakfast and any other reading before lunch, dinner, or before bedtime and bring log to clinic. 4. Labs stable at last visit.  Pt on statin.  Needs urine microalbumin or ace/arb. 5. Follow up 6 months.

## 2017-03-01 NOTE — Assessment & Plan Note (Signed)
Pt requests refill of finasteride.  Has no ongoing symptoms of urinary retention, dysuria.  Plan: 1. Refill finasteride. 2. Discuss BPH symptoms in detail at next visit 6 months.

## 2017-03-01 NOTE — Assessment & Plan Note (Signed)
Pt notes is impropving on gabapentin after 2 months.  Takes 300 mg at bedtime.  Notes return of pain by midday.  Pt without prominent varicose veins or edema today.  Foot care continues to remain excellent.    Plan: 1. Increase gabapentin 100 mg tablet  - Take 3 tablets at bedtime, and one tablet twice daily prn neuropathy. 2. Home Health referral for physical therapy reinitiated. 3. Follow up 6 months or sooner if needed.

## 2017-03-01 NOTE — Progress Notes (Signed)
I have reviewed this encounter including the documentation in this note and/or discussed this patient with the provider, Wilhelmina Mcardle, AGPCNP-BC. I am certifying that I agree with the content of this note as supervising physician.  Saralyn Pilar, DO [facility] 03/01/2017, 3:08 PM

## 2017-03-02 DIAGNOSIS — I69390 Apraxia following cerebral infarction: Secondary | ICD-10-CM | POA: Diagnosis not present

## 2017-03-02 DIAGNOSIS — I69320 Aphasia following cerebral infarction: Secondary | ICD-10-CM | POA: Diagnosis not present

## 2017-03-02 DIAGNOSIS — J45909 Unspecified asthma, uncomplicated: Secondary | ICD-10-CM | POA: Diagnosis not present

## 2017-03-02 DIAGNOSIS — E1142 Type 2 diabetes mellitus with diabetic polyneuropathy: Secondary | ICD-10-CM | POA: Diagnosis not present

## 2017-03-02 DIAGNOSIS — I10 Essential (primary) hypertension: Secondary | ICD-10-CM | POA: Diagnosis not present

## 2017-03-04 DIAGNOSIS — I10 Essential (primary) hypertension: Secondary | ICD-10-CM | POA: Diagnosis not present

## 2017-03-04 DIAGNOSIS — I69320 Aphasia following cerebral infarction: Secondary | ICD-10-CM | POA: Diagnosis not present

## 2017-03-04 DIAGNOSIS — E1142 Type 2 diabetes mellitus with diabetic polyneuropathy: Secondary | ICD-10-CM | POA: Diagnosis not present

## 2017-03-04 DIAGNOSIS — J45909 Unspecified asthma, uncomplicated: Secondary | ICD-10-CM | POA: Diagnosis not present

## 2017-03-04 DIAGNOSIS — I69390 Apraxia following cerebral infarction: Secondary | ICD-10-CM | POA: Diagnosis not present

## 2017-03-08 DIAGNOSIS — E1142 Type 2 diabetes mellitus with diabetic polyneuropathy: Secondary | ICD-10-CM | POA: Diagnosis not present

## 2017-03-08 DIAGNOSIS — J45909 Unspecified asthma, uncomplicated: Secondary | ICD-10-CM | POA: Diagnosis not present

## 2017-03-08 DIAGNOSIS — I69320 Aphasia following cerebral infarction: Secondary | ICD-10-CM | POA: Diagnosis not present

## 2017-03-08 DIAGNOSIS — I10 Essential (primary) hypertension: Secondary | ICD-10-CM | POA: Diagnosis not present

## 2017-03-08 DIAGNOSIS — I69390 Apraxia following cerebral infarction: Secondary | ICD-10-CM | POA: Diagnosis not present

## 2017-03-09 DIAGNOSIS — E1142 Type 2 diabetes mellitus with diabetic polyneuropathy: Secondary | ICD-10-CM | POA: Diagnosis not present

## 2017-03-09 DIAGNOSIS — I10 Essential (primary) hypertension: Secondary | ICD-10-CM | POA: Diagnosis not present

## 2017-03-09 DIAGNOSIS — J45909 Unspecified asthma, uncomplicated: Secondary | ICD-10-CM | POA: Diagnosis not present

## 2017-03-09 DIAGNOSIS — I69320 Aphasia following cerebral infarction: Secondary | ICD-10-CM | POA: Diagnosis not present

## 2017-03-09 DIAGNOSIS — I69390 Apraxia following cerebral infarction: Secondary | ICD-10-CM | POA: Diagnosis not present

## 2017-03-11 DIAGNOSIS — I69390 Apraxia following cerebral infarction: Secondary | ICD-10-CM | POA: Diagnosis not present

## 2017-03-11 DIAGNOSIS — I10 Essential (primary) hypertension: Secondary | ICD-10-CM | POA: Diagnosis not present

## 2017-03-11 DIAGNOSIS — E1142 Type 2 diabetes mellitus with diabetic polyneuropathy: Secondary | ICD-10-CM | POA: Diagnosis not present

## 2017-03-11 DIAGNOSIS — I69320 Aphasia following cerebral infarction: Secondary | ICD-10-CM | POA: Diagnosis not present

## 2017-03-11 DIAGNOSIS — J45909 Unspecified asthma, uncomplicated: Secondary | ICD-10-CM | POA: Diagnosis not present

## 2017-03-15 DIAGNOSIS — E1142 Type 2 diabetes mellitus with diabetic polyneuropathy: Secondary | ICD-10-CM | POA: Diagnosis not present

## 2017-03-15 DIAGNOSIS — I10 Essential (primary) hypertension: Secondary | ICD-10-CM | POA: Diagnosis not present

## 2017-03-15 DIAGNOSIS — J45909 Unspecified asthma, uncomplicated: Secondary | ICD-10-CM | POA: Diagnosis not present

## 2017-03-15 DIAGNOSIS — I69320 Aphasia following cerebral infarction: Secondary | ICD-10-CM | POA: Diagnosis not present

## 2017-03-15 DIAGNOSIS — I69390 Apraxia following cerebral infarction: Secondary | ICD-10-CM | POA: Diagnosis not present

## 2017-03-17 DIAGNOSIS — E1142 Type 2 diabetes mellitus with diabetic polyneuropathy: Secondary | ICD-10-CM | POA: Diagnosis not present

## 2017-03-17 DIAGNOSIS — I10 Essential (primary) hypertension: Secondary | ICD-10-CM | POA: Diagnosis not present

## 2017-03-17 DIAGNOSIS — I69320 Aphasia following cerebral infarction: Secondary | ICD-10-CM | POA: Diagnosis not present

## 2017-03-17 DIAGNOSIS — I69390 Apraxia following cerebral infarction: Secondary | ICD-10-CM | POA: Diagnosis not present

## 2017-03-17 DIAGNOSIS — J45909 Unspecified asthma, uncomplicated: Secondary | ICD-10-CM | POA: Diagnosis not present

## 2017-03-18 DIAGNOSIS — E1142 Type 2 diabetes mellitus with diabetic polyneuropathy: Secondary | ICD-10-CM | POA: Diagnosis not present

## 2017-03-18 DIAGNOSIS — I10 Essential (primary) hypertension: Secondary | ICD-10-CM | POA: Diagnosis not present

## 2017-03-18 DIAGNOSIS — I69320 Aphasia following cerebral infarction: Secondary | ICD-10-CM | POA: Diagnosis not present

## 2017-03-18 DIAGNOSIS — I69390 Apraxia following cerebral infarction: Secondary | ICD-10-CM | POA: Diagnosis not present

## 2017-03-18 DIAGNOSIS — J45909 Unspecified asthma, uncomplicated: Secondary | ICD-10-CM | POA: Diagnosis not present

## 2017-03-22 DIAGNOSIS — E1142 Type 2 diabetes mellitus with diabetic polyneuropathy: Secondary | ICD-10-CM | POA: Diagnosis not present

## 2017-03-22 DIAGNOSIS — I10 Essential (primary) hypertension: Secondary | ICD-10-CM | POA: Diagnosis not present

## 2017-03-22 DIAGNOSIS — J45909 Unspecified asthma, uncomplicated: Secondary | ICD-10-CM | POA: Diagnosis not present

## 2017-03-22 DIAGNOSIS — I69390 Apraxia following cerebral infarction: Secondary | ICD-10-CM | POA: Diagnosis not present

## 2017-03-22 DIAGNOSIS — I69320 Aphasia following cerebral infarction: Secondary | ICD-10-CM | POA: Diagnosis not present

## 2017-03-27 DIAGNOSIS — I69320 Aphasia following cerebral infarction: Secondary | ICD-10-CM | POA: Diagnosis not present

## 2017-03-27 DIAGNOSIS — E1142 Type 2 diabetes mellitus with diabetic polyneuropathy: Secondary | ICD-10-CM | POA: Diagnosis not present

## 2017-03-27 DIAGNOSIS — I69390 Apraxia following cerebral infarction: Secondary | ICD-10-CM | POA: Diagnosis not present

## 2017-03-27 DIAGNOSIS — J45909 Unspecified asthma, uncomplicated: Secondary | ICD-10-CM | POA: Diagnosis not present

## 2017-03-27 DIAGNOSIS — I10 Essential (primary) hypertension: Secondary | ICD-10-CM | POA: Diagnosis not present

## 2017-03-30 DIAGNOSIS — I69390 Apraxia following cerebral infarction: Secondary | ICD-10-CM | POA: Diagnosis not present

## 2017-03-30 DIAGNOSIS — J45909 Unspecified asthma, uncomplicated: Secondary | ICD-10-CM | POA: Diagnosis not present

## 2017-03-30 DIAGNOSIS — I10 Essential (primary) hypertension: Secondary | ICD-10-CM | POA: Diagnosis not present

## 2017-03-30 DIAGNOSIS — E1142 Type 2 diabetes mellitus with diabetic polyneuropathy: Secondary | ICD-10-CM | POA: Diagnosis not present

## 2017-03-30 DIAGNOSIS — I69320 Aphasia following cerebral infarction: Secondary | ICD-10-CM | POA: Diagnosis not present

## 2017-05-17 ENCOUNTER — Other Ambulatory Visit: Payer: Self-pay | Admitting: Nurse Practitioner

## 2017-05-17 DIAGNOSIS — E1143 Type 2 diabetes mellitus with diabetic autonomic (poly)neuropathy: Secondary | ICD-10-CM

## 2017-05-18 ENCOUNTER — Ambulatory Visit: Payer: Medicare Other | Admitting: Nurse Practitioner

## 2017-07-07 ENCOUNTER — Telehealth: Payer: Self-pay | Admitting: Nurse Practitioner

## 2017-07-07 NOTE — Telephone Encounter (Signed)
Pt called wanting appt for Friday with right shoulder pain, hard to swallow, spoke with  Lauren she request that we have pt to go to the ER

## 2017-07-13 ENCOUNTER — Other Ambulatory Visit: Payer: Self-pay

## 2017-07-13 ENCOUNTER — Ambulatory Visit
Admission: RE | Admit: 2017-07-13 | Discharge: 2017-07-13 | Disposition: A | Discharge status: Home / Self Care | Payer: Medicare HMO | Source: Ambulatory Visit | Attending: Nurse Practitioner | Admitting: Nurse Practitioner

## 2017-07-13 ENCOUNTER — Ambulatory Visit (INDEPENDENT_AMBULATORY_CARE_PROVIDER_SITE_OTHER): Payer: Medicare HMO | Admitting: Nurse Practitioner

## 2017-07-13 ENCOUNTER — Encounter: Payer: Self-pay | Admitting: Nurse Practitioner

## 2017-07-13 VITALS — BP 113/71 | HR 85 | Temp 98.5°F | Ht 66.0 in | Wt 157.4 lb

## 2017-07-13 DIAGNOSIS — G63 Polyneuropathy in diseases classified elsewhere: Secondary | ICD-10-CM | POA: Diagnosis not present

## 2017-07-13 DIAGNOSIS — N401 Enlarged prostate with lower urinary tract symptoms: Secondary | ICD-10-CM | POA: Diagnosis not present

## 2017-07-13 DIAGNOSIS — Z79899 Other long term (current) drug therapy: Secondary | ICD-10-CM

## 2017-07-13 DIAGNOSIS — M25511 Pain in right shoulder: Secondary | ICD-10-CM | POA: Diagnosis not present

## 2017-07-13 DIAGNOSIS — Z23 Encounter for immunization: Secondary | ICD-10-CM

## 2017-07-13 DIAGNOSIS — Z1211 Encounter for screening for malignant neoplasm of colon: Secondary | ICD-10-CM | POA: Diagnosis not present

## 2017-07-13 DIAGNOSIS — G8929 Other chronic pain: Secondary | ICD-10-CM

## 2017-07-13 DIAGNOSIS — E785 Hyperlipidemia, unspecified: Secondary | ICD-10-CM | POA: Diagnosis not present

## 2017-07-13 DIAGNOSIS — M25551 Pain in right hip: Secondary | ICD-10-CM | POA: Insufficient documentation

## 2017-07-13 DIAGNOSIS — R351 Nocturia: Secondary | ICD-10-CM | POA: Diagnosis not present

## 2017-07-13 DIAGNOSIS — I10 Essential (primary) hypertension: Secondary | ICD-10-CM

## 2017-07-13 DIAGNOSIS — E1143 Type 2 diabetes mellitus with diabetic autonomic (poly)neuropathy: Secondary | ICD-10-CM

## 2017-07-13 LAB — POCT GLYCOSYLATED HEMOGLOBIN (HGB A1C): Hemoglobin A1C: 6.1

## 2017-07-13 MED ORDER — AMLODIPINE BESYLATE 5 MG PO TABS: 5.0000 mg | ORAL_TABLET | Freq: Every day | ORAL | 3 refills | Status: DC

## 2017-07-13 MED ORDER — TAMSULOSIN HCL 0.4 MG PO CAPS: 0.4000 mg | ORAL_CAPSULE | Freq: Every day | ORAL | 3 refills | Status: DC

## 2017-07-13 MED ORDER — METFORMIN HCL 500 MG PO TABS: ORAL_TABLET | ORAL | 3 refills | Status: DC

## 2017-07-13 MED ORDER — ATORVASTATIN CALCIUM 20 MG PO TABS: 20.0000 mg | ORAL_TABLET | Freq: Every day | ORAL | 3 refills | Status: DC

## 2017-07-13 MED ORDER — GABAPENTIN 300 MG PO CAPS
300.0000 mg | ORAL_CAPSULE | Freq: Two times a day (BID) | ORAL | 3 refills | Status: DC
Start: 2017-07-13 — End: 2017-07-13

## 2017-07-13 MED ORDER — FINASTERIDE 5 MG PO TABS: 5.0000 mg | ORAL_TABLET | Freq: Every day | ORAL | 3 refills | Status: DC

## 2017-07-13 MED ORDER — GABAPENTIN 300 MG PO CAPS: 600.0000 mg | ORAL_CAPSULE | Freq: Three times a day (TID) | ORAL | 1 refills | Status: DC

## 2017-07-13 NOTE — Patient Instructions (Addendum)
Shane Sloan, Thank you for coming in to clinic today.  1. For your constipation: - Use dulcolax only as needed because it is a very strong medicine. - Use colace or docusate sodium generic daily or 3-4 times per week to keep your stool soft.  2. For diabetes: - GREAT WORK!! Your A1c is still at goal. - Continue metformin 1 tablet twice daily. - INCREASE your gabapentin to two tablets three times daily as needed.  You will be taking a total of 600 mg each dose.  - Your provider would like to you have your annual eye exam. Please contact your current eye doctor or here are some good options for you to contact.   [facility]         Address: [address], [place], [place] [address]     Phone: [phone]        Website: [email or web address]    [facility] Address: [address], [place], [place] [address]    Phone: [phone] Website: [email or web address]   3. Colon Cancer Screening: - For all adults age 66 and older, routine colon cancer screening is highly recommended. - Early detection of colon cancer is important, because often there are no warning signs or symptoms.  If colon cancer is found early, usually it can be cured. Advanced cancer is hard to treat.  - If you are not interested in Colonoscopy screening (if done and normal you could be cleared for 5 to 10 years until next due), then Cologuard is an excellent alternative for screening test for Colon Cancer. It is highly sensitive for detecting DNA of colon cancer from even the earliest stages. Also, there is NO bowel prep required. - If Cologuard is NEGATIVE, then it is good for 3 years before next due - If Cologuard is POSITIVE, then it is strongly advised to get a Colonoscopy, which allows the GI doctor to locate the source of the cancer or polyp (even very early stage) and treat it by removing it.  4. Arm Pain:  - You may have a rotator cuff tear or impingement.  I am getting an X ray of your shoulder  today.  We are also sending a referral to [facility]opedics at [facility]. Address: [address], [place], [place] [address] Hours:  9AM-5PM Phone: [phone]  5. For your urology followup: - You will get a call to schedule your appointment.  Please schedule a follow-up appointment with Wilhelmina McardleLauren Chastin Riesgo, AGNP. Return in about 3 months (around 10/11/2017) for hypertension and diabetes.  If you have any other questions or concerns, please feel free to call the clinic or send a message through MyChart. You may also schedule an earlier appointment if necessary.  You will receive a survey after today's visit either digitally by e-mail or paper by [employer] mail. Your experiences and feedback matter to us.  Please respond so we know how we are doing as we provide care for you.   Wilhelmina McardleLauren Eryk Beavers, DNP, AGNP-BC Adult Gerontology Nurse Practitioner [facility] [place] [facility]

## 2017-07-13 NOTE — Progress Notes (Signed)
Subjective:    Patient ID: Shane Sloan, male    DOB: 12-15-1950, 67 y.o.   MRN: [id number]  Shane Sloan is a 67 y.o. male presenting on 07/13/2017 for Diabetes   HPI R arm pain No known injury.  Gabapentin for feet regularyly with tingling, numbness, and burning.  Now has sharp and aching pain in right arm.  No numbness, but only pain with movement of right arm.  Started February 2018, but has not had significant problem, so didn't mention this.  Now is very painful at night before bed.  Now is using lotion and vicks vaporub to help with sleep.  Is limiting his mobility of right arm to prevent pain.  Occasionally radiates toward neck.  Pain occasionally in wrist and fingers, but more in right shoulder. - Pt requests neurology or rehab medicine doctor referral for shoulder pain. - Pt did have a fall about 2 weeks ago, but does not remember the fall or injury.  Diabetes Pt presents today for follow up of Type 2 diabetes mellitus. He is not checking CBG at home - Current diabetic medications include: metformin - He is currently symptomatic with paresthesias in feet.  - He denies polydipsia, polyphagia, polyuria, headaches, diaphoresis, shakiness, chills and changes in vision.   - Clinical course has been stable. - He  reports an exercise routine that includes is stretching and walking at home, 4-5 days per week. - His diet is high in salt, moderate in fat, and moderate in carbohydrates. - Weight trend: stable  PREVENTION: Eye exam current (within one year): no Foot exam current (within one year): yes Lipid/ASCVD risk reduction - on statin: yes Kidney protection - on ace or arb: no - needs microalbumin Recent Labs    09/25/16 1120 07/13/17 1035  HGBA1C 6.3% 6.1    Urology referral request: followup BPH and prostate specific meds per pt request. Low LUTS symptoms today without any incontinence, weak urine stream, dribbling, incomplete bladder emptying.  Pt does have nocturia x  1-2 per night, but is not bothered by this at this time.  Pt has been taking tamsulosin and finasteride without side effects.   Social History   Tobacco Use  . Smoking status: Never Smoker  . Smokeless tobacco: Never Used  Substance Use Topics  . Alcohol use: No  . Drug use: No    Review of Systems Per HPI unless specifically indicated above     Objective:    BP 113/71 (BP Location: Left Arm, Patient Position: Sitting, Cuff Size: Normal)   Pulse 85   Temp 98.5 F (36.9 C) (Oral)   Ht 5' 6" (1.676 m)   Wt 157 lb 6.4 oz (71.4 kg)   BMI 25.41 kg/m   Wt Readings from Last 3 Encounters:  07/13/17 157 lb 6.4 oz (71.4 kg)  02/25/17 158 lb 9.6 oz (71.9 kg)  11/27/16 158 lb (71.7 kg)    Physical Exam  Constitutional: He is oriented to person, place, and time. He appears well-developed and well-nourished. No distress.  HENT:  Head: Normocephalic and atraumatic.  Right Ear: Tympanic membrane, external ear and ear canal normal.  Cerumen obstructs view in L ear canal.  Cerumen in R ear.  Eyes: Pupils are equal, round, and reactive to light.  Arcus senilis present bilaterally  Neck: Normal range of motion. Neck supple. No JVD present. No tracheal deviation present. No thyromegaly present.  Cardiovascular: Normal rate, regular rhythm, normal heart sounds and intact distal pulses.  Bilateral pedal edema.  Venous stasis ulcer (healing) Lateral LE 2 inches above lateral malleolus.  Numerous bilateral varicose veins. +2 nonpitting edema, prominent sock-line edema  Pulmonary/Chest: Effort normal and breath sounds normal. No respiratory distress.  Abdominal: Soft. Bowel sounds are normal. He exhibits no distension and no mass. There is no tenderness.  Musculoskeletal: He exhibits no edema.       Right ankle: He exhibits normal range of motion, no swelling, no ecchymosis, no deformity, no laceration and normal pulse. No tenderness. Achilles tendon normal.  Right Shoulder Inspection: Normal  appearance bilateral symmetrical Palpation: Non-tender to palpation over anterior, lateral, or posterior shoulder  ROM: Limited AROM and PROM for forward flexion, abduction, external rotation.  Normal internal rotation. Special Testing: Rotator cuff testing positive for weakness and pain with supraspinatus full can and empty can test, O'brien's negative for positive pain Strength: Reduced strength 4/5 flex/ext, ext rot / int rot, grip, rotator cuff str testing. Neurovascular: Distally intact pulses, sensation to light touch  Lymphadenopathy:    He has no cervical adenopathy.  Neurological: He is alert and oriented to person, place, and time. He has normal strength and normal reflexes. No sensory deficit. He displays a negative Romberg sign.  Mild expressive and receptive aphasia remains  Skin: Skin is warm and dry.     Psychiatric: He has a normal mood and affect. His behavior is normal. Judgment and thought content normal. Cognition and memory are normal.      Results for orders placed or performed in visit on 07/13/17  Comprehensive metabolic panel  Result Value Ref Range   Glucose, Bld 114 65 - 139 mg/dL   BUN 10 7 - 25 mg/dL   Creat 0.84 0.70 - 1.25 mg/dL   BUN/Creatinine Ratio NOT APPLICABLE 6 - 22 (calc)   Sodium 138 135 - 146 mmol/L   Potassium 4.2 3.5 - 5.3 mmol/L   Chloride 103 98 - 110 mmol/L   CO2 27 20 - 32 mmol/L   Calcium 9.4 8.6 - 10.3 mg/dL   Total Protein 7.4 6.1 - 8.1 g/dL   Albumin 4.6 3.6 - 5.1 g/dL   Globulin 2.8 1.9 - 3.7 g/dL (calc)   AG Ratio 1.6 1.0 - 2.5 (calc)   Total Bilirubin 0.6 0.2 - 1.2 mg/dL   Alkaline phosphatase (APISO) 52 40 - 115 U/L   AST 19 10 - 35 U/L   ALT 24 9 - 46 U/L  PSA  Result Value Ref Range   PSA 0.3 < OR = 4.0 ng/mL  Lipid panel  Result Value Ref Range   Cholesterol 111 <200 mg/dL   HDL 56 >40 mg/dL   Triglycerides 149 <150 mg/dL   LDL Cholesterol (Calc) 32 mg/dL (calc)   Total CHOL/HDL Ratio 2.0 <5.0 (calc)   Non-HDL  Cholesterol (Calc) 55 <130 mg/dL (calc)  POCT glycosylated hemoglobin (Hb A1C)  Result Value Ref Range   Hemoglobin A1C 6.1       Assessment & Plan:   Problem List Items Addressed This Visit      Cardiovascular and Mediastinum   Hypertension    Pt requests med refills today.  Stable HTN.  Refills provided.      Relevant Medications   amLODipine (NORVASC) 5 MG tablet   atorvastatin (LIPITOR) 20 MG tablet     Endocrine   Controlled type 2 diabetes mellitus with diabetic autonomic neuropathy, without long-term current use of insulin (HCC) - Primary    Controlled today with stable POCT HgbA1c=6.1% and no home CBG  readings..     Plan: 1. Dietary knowledge sufficient.  Encouraged healthy diet. 2. Continue metformin 500 mg one tablet twice daily. 3. Continue AM fasting CBG checks. 4. Labs due today.  Obtain urine microalbumin. 5. Follow up 6 months.      Relevant Medications   metFORMIN (GLUCOPHAGE) 500 MG tablet   atorvastatin (LIPITOR) 20 MG tablet   Other Relevant Orders   POCT glycosylated hemoglobin (Hb A1C) (Completed)   Comprehensive metabolic panel (Completed)   Lipid panel (Completed)   POCT UA - Microalbumin     Nervous and Auditory   Polyneuropathy due to medical condition ([place])    Pt notes is controlled on gabapentin 300 mg at bedtime with 100 mg twice daily.  Pt has no complaints about his foot pain today, stating it is tolerable.  Plan: 1. Continue gabapentin 100 mg tablet  - Take 3 tablets at bedtime, and one tablet twice daily prn neuropathy. 2. Follow up 3 months or sooner if needed.      Relevant Medications   gabapentin (NEURONTIN) 300 MG capsule     Genitourinary   BPH associated with nocturia    Pt requests refill of finasteride and referral to urology.  Pt has had no worsening of LUTS symptoms on finasteride and tamsulosin daily.  Plan: 1. Refill finasteride and tamsulosin. 2. Referral urology for continuing care. 3. Followup as needed.        Relevant Medications   finasteride (PROSCAR) 5 MG tablet   tamsulosin (FLOMAX) 0.4 MG CAPS capsule   Other Relevant Orders   PSA (Completed)   Ambulatory referral to Urology     Other   Hyperlipidemia    Stable at last labs.  Pt requests refill of medication.  Refill provided.      Relevant Medications   amLODipine (NORVASC) 5 MG tablet   atorvastatin (LIPITOR) 20 MG tablet    Other Visit Diagnoses    High risk medication use       Relevant Orders   Comprehensive metabolic panel (Completed)   PSA (Completed)   Lipid panel (Completed)   Need for vaccination against Streptococcus pneumoniae using pneumococcal conjugate vaccine 13       Relevant Orders   Pneumococcal conjugate vaccine 13-valent IM (Completed)   Colon cancer screening     Pt requests colon ca screening with cologuard.  Order placed.  Instructions provided to patient about collection and return of kit via UPS.   Relevant Orders   Cologuard   Chronic right shoulder pain     Pain acute vs chronic with pt as poor historian.  Pt with significant limitation of ROM and pain with movement (abduction, external rotation worst).  Consider rotator cuff pathology with pain on supraspinatus testing.  Complicated by fall 2 weeks ago.  Plan:  1. Treat with acetaminophen and ibuprofen.  Discussed alternate dosing and max dosing. 2. Apply heat and/or ice to affected area. 3. May also apply a muscle rub with lidocaine after heat or ice. 4. Continue gabapentin for neuropathy.  Cautioned drowsiness. 5. Referral orthopedics for consult for rotator cuff. 6. Follow up as needed.   Relevant Medications   gabapentin (NEURONTIN) 300 MG capsule   Other Relevant Orders   Ambulatory referral to Orthopedic Surgery   DG Shoulder Right (Completed)      Meds ordered this encounter  Medications  . amLODipine (NORVASC) 5 MG tablet    Sig: Take 1 tablet (5 mg total) by mouth daily.    Dispense:  90 tablet    Refill:  3    Order  Specific Question:   Supervising Provider    Answer:   Olin Hauser [[id number]]  . finasteride (PROSCAR) 5 MG tablet    Sig: Take 1 tablet (5 mg total) by mouth daily.    Dispense:  90 tablet    Refill:  3    Order Specific Question:   Supervising Provider    Answer:   Olin Hauser [[id number]]  . DISCONTD: gabapentin (NEURONTIN) 300 MG capsule    Sig: Take 1 capsule (300 mg total) by mouth 2 (two) times daily.    Dispense:  180 capsule    Refill:  3    Order Specific Question:   Supervising Provider    Answer:   Olin Hauser [[id number]]  . metFORMIN (GLUCOPHAGE) 500 MG tablet    Sig: TAKE 1 TABLET(500 MG) BY MOUTH TWICE DAILY WITH MEALS    Dispense:  180 tablet    Refill:  3    Order Specific Question:   Supervising Provider    Answer:   Olin Hauser [[id number]]  . tamsulosin (FLOMAX) 0.4 MG CAPS capsule    Sig: Take 1 capsule (0.4 mg total) by mouth daily. At the same time each day    Dispense:  90 capsule    Refill:  3    Order Specific Question:   Supervising Provider    Answer:   Olin Hauser [[id number]]  . atorvastatin (LIPITOR) 20 MG tablet    Sig: Take 1 tablet (20 mg total) by mouth daily.    Dispense:  90 tablet    Refill:  3    Order Specific Question:   Supervising Provider    Answer:   Olin Hauser [[id number]]  . gabapentin (NEURONTIN) 300 MG capsule    Sig: Take 2 capsules (600 mg total) by mouth 3 (three) times daily.    Dispense:  180 capsule    Refill:  1    Use this prescription instead of 300 mg bid (LK 07/13/2017).    Order Specific Question:   Supervising Provider    Answer:   Olin Hauser [[id number]]      Follow up plan: Return in about 3 months (around 10/11/2017) for hypertension and diabetes.  Cassell Smiles, DNP, AGPCNP-BC Adult Gerontology Primary Care Nurse Practitioner [facility] 07/16/2017, 4:37 PM

## 2017-07-14 ENCOUNTER — Telehealth: Payer: Self-pay

## 2017-07-14 LAB — LIPID PANEL
Cholesterol: 111 mg/dL (ref <200)
HDL: 56 mg/dL (ref >40)
LDL Cholesterol (Calc): 32 mg/dL (calc)
Non-HDL Cholesterol (Calc): 55 mg/dL (calc) (ref <130)
Total CHOL/HDL Ratio: 2 (calc) (ref <5.0)
Triglycerides: 149 mg/dL (ref <150)

## 2017-07-14 LAB — COMPREHENSIVE METABOLIC PANEL
AG Ratio: 1.6 (calc) (ref 1.0–2.5)
ALT: 24 U/L (ref 9–46)
AST: 19 U/L (ref 10–35)
Albumin: 4.6 g/dL (ref 3.6–5.1)
Alkaline phosphatase (APISO): 52 U/L (ref 40–115)
BUN: 10 mg/dL (ref 7–25)
CO2: 27 mmol/L (ref 20–32)
Calcium: 9.4 mg/dL (ref 8.6–10.3)
Chloride: 103 mmol/L (ref 98–110)
Creat: 0.84 mg/dL (ref 0.70–1.25)
Globulin: 2.8 g/dL (calc) (ref 1.9–3.7)
Glucose, Bld: 114 mg/dL (ref 65–139)
Potassium: 4.2 mmol/L (ref 3.5–5.3)
Sodium: 138 mmol/L (ref 135–146)
Total Bilirubin: 0.6 mg/dL (ref 0.2–1.2)
Total Protein: 7.4 g/dL (ref 6.1–8.1)

## 2017-07-14 LAB — PSA: PSA: 0.3 ng/mL (ref <=4.0)

## 2017-07-14 NOTE — Telephone Encounter (Signed)
-----   Message from Galen ManilaLauren Renee Kennedy, NP sent at 07/14/2017  1:16 PM EST ----- CMP: Your CMP is normal.  Normal fasting glucose and normal electrolytes, kidney function, and liver function.  PSA is normal and consistent with your last reading.  We will not need to proceed with any future testing for screening for prostate cancer. It is still safe to continue both medications for BPH and urinary symptoms.  LIPID PANEL:  All values for cholesterol, triglycerides, LDL, HDL are normal.  Continue atorvastatin without changes.  Hgb A1C is at goal.  No treatment modification is required. Continue Metformin 500 mg twice daily.  Result and plan shared at office visit.

## 2017-07-14 NOTE — Telephone Encounter (Signed)
Attempted to contact the pt, no answer. LMOM to return my call.  

## 2017-07-16 ENCOUNTER — Encounter: Payer: Self-pay | Admitting: Nurse Practitioner

## 2017-07-16 NOTE — Assessment & Plan Note (Signed)
Pt requests med refills today.  Stable HTN.  Refills provided.

## 2017-07-16 NOTE — Assessment & Plan Note (Signed)
Pt notes is controlled on gabapentin 300 mg at bedtime with 100 mg twice daily.  Pt has no complaints about his foot pain today, stating it is tolerable.  Plan: 1. Continue gabapentin 100 mg tablet  - Take 3 tablets at bedtime, and one tablet twice daily prn neuropathy. 2. Follow up 3 months or sooner if needed.

## 2017-07-16 NOTE — Assessment & Plan Note (Signed)
Pt requests refill of finasteride and referral to urology.  Pt has had no worsening of LUTS symptoms on finasteride and tamsulosin daily.  Plan: 1. Refill finasteride and tamsulosin. 2. Referral urology for continuing care. 3. Followup as needed.

## 2017-07-16 NOTE — Assessment & Plan Note (Signed)
Stable at last labs.  Pt requests refill of medication.  Refill provided.

## 2017-07-16 NOTE — Assessment & Plan Note (Addendum)
Controlled today with stable POCT HgbA1c=6.1% and no home CBG readings..     Plan: 1. Dietary knowledge sufficient.  Encouraged healthy diet. 2. Continue metformin 500 mg one tablet twice daily. 3. Continue AM fasting CBG checks. 4. Labs due today.  Obtain urine microalbumin. 5. Follow up 6 months.

## 2017-07-26 DIAGNOSIS — M7541 Impingement syndrome of right shoulder: Secondary | ICD-10-CM | POA: Diagnosis not present

## 2017-07-26 DIAGNOSIS — M5412 Radiculopathy, cervical region: Secondary | ICD-10-CM | POA: Diagnosis not present

## 2017-07-26 DIAGNOSIS — M545 Low back pain: Secondary | ICD-10-CM | POA: Diagnosis not present

## 2017-07-26 DIAGNOSIS — M542 Cervicalgia: Secondary | ICD-10-CM | POA: Diagnosis not present

## 2017-07-26 DIAGNOSIS — M5416 Radiculopathy, lumbar region: Secondary | ICD-10-CM | POA: Diagnosis not present

## 2017-07-30 ENCOUNTER — Ambulatory Visit: Payer: Medicare HMO

## 2017-08-16 ENCOUNTER — Telehealth: Payer: Self-pay | Admitting: Nurse Practitioner

## 2017-08-16 NOTE — Telephone Encounter (Signed)
Called to schedule AWV with Nurse Health Advisor. °Shane Sloan °[phone]  °Skype [email or web address]  ° °

## 2017-09-02 ENCOUNTER — Ambulatory Visit: Payer: Medicare Other | Admitting: Nurse Practitioner

## 2017-09-06 DIAGNOSIS — M25519 Pain in unspecified shoulder: Secondary | ICD-10-CM | POA: Diagnosis not present

## 2017-10-04 ENCOUNTER — Other Ambulatory Visit: Payer: Self-pay

## 2017-10-04 ENCOUNTER — Encounter: Payer: Self-pay | Admitting: Nurse Practitioner

## 2017-10-04 ENCOUNTER — Ambulatory Visit (INDEPENDENT_AMBULATORY_CARE_PROVIDER_SITE_OTHER): Payer: Medicare HMO | Admitting: Nurse Practitioner

## 2017-10-04 VITALS — BP 119/85 | HR 97 | Temp 98.1°F | Ht 66.0 in | Wt 160.2 lb

## 2017-10-04 DIAGNOSIS — M150 Primary generalized (osteo)arthritis: Secondary | ICD-10-CM

## 2017-10-04 DIAGNOSIS — M8949 Other hypertrophic osteoarthropathy, multiple sites: Secondary | ICD-10-CM

## 2017-10-04 DIAGNOSIS — M159 Polyosteoarthritis, unspecified: Secondary | ICD-10-CM

## 2017-10-04 MED ORDER — DICLOFENAC SODIUM 75 MG PO TBEC: 75.0000 mg | DELAYED_RELEASE_TABLET | Freq: Two times a day (BID) | ORAL | 0 refills | Status: DC

## 2017-10-04 NOTE — Patient Instructions (Addendum)
Shane Sloan,   Thank you for coming in to clinic today.  1. START taking diclofenac 75 mg tablet twice daily. 2. START physical therapy for bilateral shoulder ROM and strengthening.   Please schedule a follow-up appointment with Wilhelmina Mcardle, AGNP. Return in about 1 month (around 11/01/2017) for diabetes, hypertension.  If you have any other questions or concerns, please feel free to call the clinic or send a message through MyChart. You may also schedule an earlier appointment if necessary.  You will receive a survey after today's visit either digitally by e-mail or paper by [employer]. Your experiences and feedback matter to [place].  Please respond so we know how we are doing as we provide care for you.   Wilhelmina Mcardle, DNP, AGNP-BC Adult Gerontology Nurse Practitioner [facility]    Arthritis Arthritis is a term that is commonly used to refer to joint pain or joint disease. There are more than 100 types of arthritis. What are the causes? The most common cause of this condition is wear and tear of a joint. Other causes include:  Gout.  Inflammation of a joint.  An infection of a joint.  Sprains and other injuries near the joint.  A drug reaction or allergic reaction.  In some cases, the cause may not be known. What are the signs or symptoms? The main symptom of this condition is pain in the joint with movement. Other symptoms include:  Redness, swelling, or stiffness at a joint.  Warmth coming from the joint.  Fever.  Overall feeling of illness.  How is this diagnosed? This condition may be diagnosed with a physical exam and tests, including:  Blood tests.  Urine tests.  Imaging tests, such as MRI, X-rays, or a CT scan.  Sometimes, fluid is removed from a joint for testing. How is this treated? Treatment for this condition may involve:  Treatment of the cause, if it is known.  Rest.  Raising (elevating) the joint.  Applying  cold or hot packs to the joint.  Medicines to improve symptoms and reduce inflammation.  Injections of a steroid such as cortisone into the joint to help reduce pain and inflammation.  Depending on the cause of your arthritis, you may need to make lifestyle changes to reduce stress on your joint. These changes may include exercising more and losing weight. Follow these instructions at home: Medicines  Take over-the-counter and prescription medicines only as told by your health care provider.  Do not take aspirin to relieve pain if gout is suspected. Activity  Rest your joint if told by your health care provider. Rest is important when your disease is active and your joint feels painful, swollen, or stiff.  Avoid activities that make the pain worse. It is important to balance activity with rest.  Exercise your joint regularly with range-of-motion exercises as told by your health care provider. Try doing low-impact exercise, such as: ? Swimming. ? Water aerobics. ? Biking. ? Walking. Joint Care   If your joint is swollen, keep it elevated if told by your health care provider.  If your joint feels stiff in the morning, try taking a warm shower.  If directed, apply heat to the joint. If you have diabetes, do not apply heat without permission from your health care provider. ? Put a towel between the joint and the hot pack or heating pad. ? Leave the heat on the area for 20-30 minutes.  If directed, apply ice to the joint: ?  Put ice in a plastic bag. ? Place a towel between your skin and the bag. ? Leave the ice on for 20 minutes, 2-3 times per day.  Keep all follow-up visits as told by your health care provider. This is important. Contact a health care provider if:  The pain gets worse.  You have a fever. Get help right away if:  You develop severe joint pain, swelling, or redness.  Many joints become painful and swollen.  You develop severe back pain.  You develop  severe weakness in your leg.  You cannot control your bladder or bowels. This information is not intended to replace advice given to you by your health care provider. Make sure you discuss any questions you have with your health care provider. Document Released: 07/30/2004 Document Revised: 11/28/2015 Document Reviewed: 09/17/2014 Elsevier Interactive Patient Education  [employer]2018 [employer]

## 2017-10-04 NOTE — Progress Notes (Signed)
Subjective:    Patient ID: Shane Sloan, male    DOB: Mar 19, 1951, 67 y.o.   MRN: [id number]  Shane Flemingsduardo Lorentz is a 67 y.o. male presenting on 10/04/2017 for Shoulder Pain (bilateral shoulder pain, pt requesting a referral to Rheumatologist for the pain. He went to Ortho, no improvement after treatment. )   HPI Shoulder Pain Pt presents stating he would like rheumatology referral for pain.  Now both shoulders and arms are hurting now rather than only R shoulder. - Has more pain in shoulders at night - He has had injections at [facility] without relief and had more pain in left arm after injections.  Prescribed cyclobenzaprine - this is not helping.  He continues to have pain. -He has not completed physical therapy as previously requested secondary to financial concerns to pay co-pays for physical therapy.  He is also unable to get transportation to physical therapy 3 days a week.  Social History   Tobacco Use  . Smoking status: Never Smoker  . Smokeless tobacco: Never Used  Substance Use Topics  . Alcohol use: No  . Drug use: No    Review of Systems Per HPI unless specifically indicated above     Objective:    BP 119/85 (BP Location: Left Arm, Patient Position: Sitting, Cuff Size: Normal)   Pulse 97   Temp 98.1 F (36.7 C) (Oral)   Ht 5\' 6"  (1.676 m)   Wt 160 lb 3.2 oz (72.7 kg)   BMI 25.86 kg/m   Wt Readings from Last 3 Encounters:  10/04/17 160 lb 3.2 oz (72.7 kg)  07/13/17 157 lb 6.4 oz (71.4 kg)  02/25/17 158 lb 9.6 oz (71.9 kg)    Physical Exam  Constitutional: He is oriented to person, place, and time. He appears well-developed and well-nourished. No distress.  HENT:  Head: Normocephalic and atraumatic.  Neck: Normal range of motion. Neck supple. Carotid bruit is not present.  Cardiovascular: Normal rate, regular rhythm, S1 normal, S2 normal, normal heart sounds and intact distal pulses.  Pulmonary/Chest: Effort normal and breath sounds normal. No respiratory  distress.  Musculoskeletal: He exhibits no edema (pedal).  Bilateral Shoulders Inspection: Forward rotation / hunched appearance bilaterally, symmetrical Palpation: Non-tender to palpation over anterior, lateral, or posterior shoulder  ROM: Significantly reduced AROM with additional 20-30% ROM achieved with PROM for all directions of forward flexion, abduction, internal / external rotation, symmetrical Special Testing: Rotator cuff testing positive for weakness and pain with supraspinatus full can and empty can test,  Strength: Reduced strength 4/5 flex/ext, ext rot / int rot, grip, rotator cuff str testing. Neurovascular: Distally intact pulses, sensation to light touch  Neurological: He is alert and oriented to person, place, and time.  Skin: Skin is warm and dry.  Psychiatric: He has a normal mood and affect. His behavior is normal.  Vitals reviewed.   Results for orders placed or performed in visit on 07/13/17  Comprehensive metabolic panel  Result Value Ref Range   Glucose, Bld 114 65 - 139 mg/dL   BUN 10 7 - 25 mg/dL   Creat 4.090.84 8.110.70 - 9.141.25 mg/dL   BUN/Creatinine Ratio NOT APPLICABLE 6 - 22 (calc)   Sodium 138 135 - 146 mmol/L   Potassium 4.2 3.5 - 5.3 mmol/L   Chloride 103 98 - 110 mmol/L   CO2 27 20 - 32 mmol/L   Calcium 9.4 8.6 - 10.3 mg/dL   Total Protein 7.4 6.1 - 8.1 g/dL   Albumin 4.6 3.6 -  5.1 g/dL   Globulin 2.8 1.9 - 3.7 g/dL (calc)   AG Ratio 1.6 1.0 - 2.5 (calc)   Total Bilirubin 0.6 0.2 - 1.2 mg/dL   Alkaline phosphatase (APISO) 52 40 - 115 U/L   AST 19 10 - 35 U/L   ALT 24 9 - 46 U/L  PSA  Result Value Ref Range   PSA 0.3 < OR = 4.0 ng/mL  Lipid panel  Result Value Ref Range   Cholesterol 111 <200 mg/dL   HDL 56 >16 mg/dL   Triglycerides 109 <604 mg/dL   LDL Cholesterol (Calc) 32 mg/dL (calc)   Total CHOL/HDL Ratio 2.0 <5.0 (calc)   Non-HDL Cholesterol (Calc) 55 <540 mg/dL (calc)  POCT glycosylated hemoglobin (Hb A1C)  Result Value Ref Range    Hemoglobin A1C 6.1       Assessment & Plan:   Problem List Items Addressed This Visit    None    Visit Diagnoses    Primary osteoarthritis involving multiple joints    -  Primary Patient with chronic right shoulder pain and now reported new left shoulder pain and immobility.  Patient has had corticosteroid injections of bilateral shoulders at orthopedics.  Patient reports being unhappy with the care he received because it has not helped his pain.  Mobility seems to be worsening on physical exam.  Plan: 1.  Repeat physical therapy for shoulder mobility.  Home health referral placed. 2.  Referral to orthopedic surgery at [facility] clinic per patient request 3.  Start diclofenac 75 mg tablet twice daily 4.  Continue cyclobenzaprine 5.  Encourage patient to be active and continue range of motion exercises.  Encouraged patient to continue with proper posture and regular physical activity. 6.  Follow-up as needed.   Relevant Medications   cyclobenzaprine (FLEXERIL) 10 MG tablet   diclofenac (VOLTAREN) 75 MG EC tablet   Other Relevant Orders   Ambulatory referral to Orthopedic Surgery   Ambulatory referral to Home Health      Meds ordered this encounter  Medications  . diclofenac (VOLTAREN) 75 MG EC tablet    Sig: Take 1 tablet (75 mg total) by mouth 2 (two) times daily.    Dispense:  30 tablet    Refill:  0    Order Specific Question:   Supervising Provider    Answer:   Smitty Cords [[id number]]    Follow up plan: Return in about 1 month (around 11/01/2017) for diabetes, hypertension.  Wilhelmina Mcardle, DNP, AGPCNP-BC Adult Gerontology Primary Care Nurse Practitioner [facility] 10/04/2017, 11:23 AM

## 2017-10-11 ENCOUNTER — Telehealth: Payer: Self-pay | Admitting: Nurse Practitioner

## 2017-10-11 DIAGNOSIS — N401 Enlarged prostate with lower urinary tract symptoms: Secondary | ICD-10-CM | POA: Diagnosis not present

## 2017-10-11 DIAGNOSIS — R351 Nocturia: Secondary | ICD-10-CM | POA: Diagnosis not present

## 2017-10-11 DIAGNOSIS — I10 Essential (primary) hypertension: Secondary | ICD-10-CM | POA: Diagnosis not present

## 2017-10-11 DIAGNOSIS — J45909 Unspecified asthma, uncomplicated: Secondary | ICD-10-CM | POA: Diagnosis not present

## 2017-10-11 DIAGNOSIS — G63 Polyneuropathy in diseases classified elsewhere: Secondary | ICD-10-CM | POA: Diagnosis not present

## 2017-10-11 DIAGNOSIS — I69319 Unspecified symptoms and signs involving cognitive functions following cerebral infarction: Secondary | ICD-10-CM | POA: Diagnosis not present

## 2017-10-11 DIAGNOSIS — E1143 Type 2 diabetes mellitus with diabetic autonomic (poly)neuropathy: Secondary | ICD-10-CM | POA: Diagnosis not present

## 2017-10-11 DIAGNOSIS — M6281 Muscle weakness (generalized): Secondary | ICD-10-CM | POA: Diagnosis not present

## 2017-10-11 DIAGNOSIS — M150 Primary generalized (osteo)arthritis: Secondary | ICD-10-CM | POA: Diagnosis not present

## 2017-10-11 NOTE — Telephone Encounter (Signed)
Fredric MareGreg Scott said he did PT eval today and will fax an order for PT twice a week for 8 weeks.  His calll back number is [phone]

## 2017-10-15 ENCOUNTER — Telehealth: Payer: Self-pay | Admitting: Nurse Practitioner

## 2017-10-15 DIAGNOSIS — E1143 Type 2 diabetes mellitus with diabetic autonomic (poly)neuropathy: Secondary | ICD-10-CM | POA: Diagnosis not present

## 2017-10-15 DIAGNOSIS — I10 Essential (primary) hypertension: Secondary | ICD-10-CM | POA: Diagnosis not present

## 2017-10-15 DIAGNOSIS — M150 Primary generalized (osteo)arthritis: Secondary | ICD-10-CM | POA: Diagnosis not present

## 2017-10-15 DIAGNOSIS — I69319 Unspecified symptoms and signs involving cognitive functions following cerebral infarction: Secondary | ICD-10-CM | POA: Diagnosis not present

## 2017-10-15 DIAGNOSIS — J45909 Unspecified asthma, uncomplicated: Secondary | ICD-10-CM | POA: Diagnosis not present

## 2017-10-15 DIAGNOSIS — R351 Nocturia: Secondary | ICD-10-CM | POA: Diagnosis not present

## 2017-10-15 DIAGNOSIS — N401 Enlarged prostate with lower urinary tract symptoms: Secondary | ICD-10-CM | POA: Diagnosis not present

## 2017-10-15 DIAGNOSIS — M6281 Muscle weakness (generalized): Secondary | ICD-10-CM | POA: Diagnosis not present

## 2017-10-15 DIAGNOSIS — G63 Polyneuropathy in diseases classified elsewhere: Secondary | ICD-10-CM | POA: Diagnosis not present

## 2017-10-15 NOTE — Telephone Encounter (Signed)
error 

## 2017-10-18 ENCOUNTER — Other Ambulatory Visit: Payer: Self-pay | Admitting: Nurse Practitioner

## 2017-10-18 DIAGNOSIS — M150 Primary generalized (osteo)arthritis: Principal | ICD-10-CM

## 2017-10-18 DIAGNOSIS — M159 Polyosteoarthritis, unspecified: Secondary | ICD-10-CM

## 2017-10-19 DIAGNOSIS — M6281 Muscle weakness (generalized): Secondary | ICD-10-CM | POA: Diagnosis not present

## 2017-10-19 DIAGNOSIS — M150 Primary generalized (osteo)arthritis: Secondary | ICD-10-CM | POA: Diagnosis not present

## 2017-10-19 DIAGNOSIS — E1143 Type 2 diabetes mellitus with diabetic autonomic (poly)neuropathy: Secondary | ICD-10-CM | POA: Diagnosis not present

## 2017-10-19 DIAGNOSIS — I10 Essential (primary) hypertension: Secondary | ICD-10-CM | POA: Diagnosis not present

## 2017-10-19 DIAGNOSIS — N401 Enlarged prostate with lower urinary tract symptoms: Secondary | ICD-10-CM | POA: Diagnosis not present

## 2017-10-19 DIAGNOSIS — I69319 Unspecified symptoms and signs involving cognitive functions following cerebral infarction: Secondary | ICD-10-CM | POA: Diagnosis not present

## 2017-10-19 DIAGNOSIS — J45909 Unspecified asthma, uncomplicated: Secondary | ICD-10-CM | POA: Diagnosis not present

## 2017-10-19 DIAGNOSIS — R351 Nocturia: Secondary | ICD-10-CM | POA: Diagnosis not present

## 2017-10-19 DIAGNOSIS — G63 Polyneuropathy in diseases classified elsewhere: Secondary | ICD-10-CM | POA: Diagnosis not present

## 2017-10-20 ENCOUNTER — Other Ambulatory Visit: Payer: Self-pay | Admitting: Nurse Practitioner

## 2017-10-20 DIAGNOSIS — M150 Primary generalized (osteo)arthritis: Principal | ICD-10-CM

## 2017-10-20 DIAGNOSIS — M159 Polyosteoarthritis, unspecified: Secondary | ICD-10-CM

## 2017-10-21 ENCOUNTER — Other Ambulatory Visit: Payer: Self-pay

## 2017-10-21 DIAGNOSIS — N401 Enlarged prostate with lower urinary tract symptoms: Secondary | ICD-10-CM | POA: Diagnosis not present

## 2017-10-21 DIAGNOSIS — R351 Nocturia: Secondary | ICD-10-CM

## 2017-10-21 DIAGNOSIS — I69319 Unspecified symptoms and signs involving cognitive functions following cerebral infarction: Secondary | ICD-10-CM | POA: Diagnosis not present

## 2017-10-21 DIAGNOSIS — E785 Hyperlipidemia, unspecified: Secondary | ICD-10-CM

## 2017-10-21 DIAGNOSIS — M150 Primary generalized (osteo)arthritis: Secondary | ICD-10-CM | POA: Diagnosis not present

## 2017-10-21 DIAGNOSIS — E1143 Type 2 diabetes mellitus with diabetic autonomic (poly)neuropathy: Secondary | ICD-10-CM

## 2017-10-21 DIAGNOSIS — I10 Essential (primary) hypertension: Secondary | ICD-10-CM

## 2017-10-21 DIAGNOSIS — G63 Polyneuropathy in diseases classified elsewhere: Secondary | ICD-10-CM | POA: Diagnosis not present

## 2017-10-21 DIAGNOSIS — J45909 Unspecified asthma, uncomplicated: Secondary | ICD-10-CM | POA: Diagnosis not present

## 2017-10-21 DIAGNOSIS — M6281 Muscle weakness (generalized): Secondary | ICD-10-CM | POA: Diagnosis not present

## 2017-10-21 NOTE — Telephone Encounter (Signed)
This will continue to be declined.  This was short term medication.  Pt needs to be seen by orthopedics.

## 2017-10-25 DIAGNOSIS — M5030 Other cervical disc degeneration, unspecified cervical region: Secondary | ICD-10-CM | POA: Diagnosis not present

## 2017-10-25 DIAGNOSIS — E119 Type 2 diabetes mellitus without complications: Secondary | ICD-10-CM | POA: Diagnosis not present

## 2017-10-25 DIAGNOSIS — M25512 Pain in left shoulder: Secondary | ICD-10-CM | POA: Diagnosis not present

## 2017-10-25 DIAGNOSIS — M5412 Radiculopathy, cervical region: Secondary | ICD-10-CM | POA: Diagnosis not present

## 2017-10-25 DIAGNOSIS — M25511 Pain in right shoulder: Secondary | ICD-10-CM | POA: Diagnosis not present

## 2017-10-26 DIAGNOSIS — M150 Primary generalized (osteo)arthritis: Secondary | ICD-10-CM | POA: Diagnosis not present

## 2017-10-26 DIAGNOSIS — N401 Enlarged prostate with lower urinary tract symptoms: Secondary | ICD-10-CM | POA: Diagnosis not present

## 2017-10-26 DIAGNOSIS — R351 Nocturia: Secondary | ICD-10-CM | POA: Diagnosis not present

## 2017-10-26 DIAGNOSIS — I69319 Unspecified symptoms and signs involving cognitive functions following cerebral infarction: Secondary | ICD-10-CM | POA: Diagnosis not present

## 2017-10-26 DIAGNOSIS — M6281 Muscle weakness (generalized): Secondary | ICD-10-CM | POA: Diagnosis not present

## 2017-10-26 DIAGNOSIS — J45909 Unspecified asthma, uncomplicated: Secondary | ICD-10-CM | POA: Diagnosis not present

## 2017-10-26 DIAGNOSIS — E1143 Type 2 diabetes mellitus with diabetic autonomic (poly)neuropathy: Secondary | ICD-10-CM | POA: Diagnosis not present

## 2017-10-26 DIAGNOSIS — I10 Essential (primary) hypertension: Secondary | ICD-10-CM | POA: Diagnosis not present

## 2017-10-26 DIAGNOSIS — G63 Polyneuropathy in diseases classified elsewhere: Secondary | ICD-10-CM | POA: Diagnosis not present

## 2017-10-28 DIAGNOSIS — I69319 Unspecified symptoms and signs involving cognitive functions following cerebral infarction: Secondary | ICD-10-CM | POA: Diagnosis not present

## 2017-10-28 DIAGNOSIS — N401 Enlarged prostate with lower urinary tract symptoms: Secondary | ICD-10-CM | POA: Diagnosis not present

## 2017-10-28 DIAGNOSIS — M150 Primary generalized (osteo)arthritis: Secondary | ICD-10-CM | POA: Diagnosis not present

## 2017-10-28 DIAGNOSIS — J45909 Unspecified asthma, uncomplicated: Secondary | ICD-10-CM | POA: Diagnosis not present

## 2017-10-28 DIAGNOSIS — G63 Polyneuropathy in diseases classified elsewhere: Secondary | ICD-10-CM | POA: Diagnosis not present

## 2017-10-28 DIAGNOSIS — E1143 Type 2 diabetes mellitus with diabetic autonomic (poly)neuropathy: Secondary | ICD-10-CM | POA: Diagnosis not present

## 2017-10-28 DIAGNOSIS — R351 Nocturia: Secondary | ICD-10-CM | POA: Diagnosis not present

## 2017-10-28 DIAGNOSIS — M6281 Muscle weakness (generalized): Secondary | ICD-10-CM | POA: Diagnosis not present

## 2017-10-28 DIAGNOSIS — I10 Essential (primary) hypertension: Secondary | ICD-10-CM | POA: Diagnosis not present

## 2017-11-01 DIAGNOSIS — I10 Essential (primary) hypertension: Secondary | ICD-10-CM | POA: Diagnosis not present

## 2017-11-01 DIAGNOSIS — G63 Polyneuropathy in diseases classified elsewhere: Secondary | ICD-10-CM | POA: Diagnosis not present

## 2017-11-01 DIAGNOSIS — I69319 Unspecified symptoms and signs involving cognitive functions following cerebral infarction: Secondary | ICD-10-CM | POA: Diagnosis not present

## 2017-11-01 DIAGNOSIS — M6281 Muscle weakness (generalized): Secondary | ICD-10-CM | POA: Diagnosis not present

## 2017-11-01 DIAGNOSIS — R351 Nocturia: Secondary | ICD-10-CM | POA: Diagnosis not present

## 2017-11-01 DIAGNOSIS — N401 Enlarged prostate with lower urinary tract symptoms: Secondary | ICD-10-CM | POA: Diagnosis not present

## 2017-11-01 DIAGNOSIS — J45909 Unspecified asthma, uncomplicated: Secondary | ICD-10-CM | POA: Diagnosis not present

## 2017-11-01 DIAGNOSIS — M150 Primary generalized (osteo)arthritis: Secondary | ICD-10-CM | POA: Diagnosis not present

## 2017-11-01 DIAGNOSIS — E1143 Type 2 diabetes mellitus with diabetic autonomic (poly)neuropathy: Secondary | ICD-10-CM | POA: Diagnosis not present

## 2017-11-03 DIAGNOSIS — M6281 Muscle weakness (generalized): Secondary | ICD-10-CM | POA: Diagnosis not present

## 2017-11-03 DIAGNOSIS — I69319 Unspecified symptoms and signs involving cognitive functions following cerebral infarction: Secondary | ICD-10-CM | POA: Diagnosis not present

## 2017-11-03 DIAGNOSIS — M150 Primary generalized (osteo)arthritis: Secondary | ICD-10-CM | POA: Diagnosis not present

## 2017-11-03 DIAGNOSIS — I10 Essential (primary) hypertension: Secondary | ICD-10-CM | POA: Diagnosis not present

## 2017-11-03 DIAGNOSIS — G63 Polyneuropathy in diseases classified elsewhere: Secondary | ICD-10-CM | POA: Diagnosis not present

## 2017-11-03 DIAGNOSIS — J45909 Unspecified asthma, uncomplicated: Secondary | ICD-10-CM | POA: Diagnosis not present

## 2017-11-03 DIAGNOSIS — N401 Enlarged prostate with lower urinary tract symptoms: Secondary | ICD-10-CM | POA: Diagnosis not present

## 2017-11-03 DIAGNOSIS — R351 Nocturia: Secondary | ICD-10-CM | POA: Diagnosis not present

## 2017-11-03 DIAGNOSIS — E1143 Type 2 diabetes mellitus with diabetic autonomic (poly)neuropathy: Secondary | ICD-10-CM | POA: Diagnosis not present

## 2017-11-09 DIAGNOSIS — G63 Polyneuropathy in diseases classified elsewhere: Secondary | ICD-10-CM | POA: Diagnosis not present

## 2017-11-09 DIAGNOSIS — I69319 Unspecified symptoms and signs involving cognitive functions following cerebral infarction: Secondary | ICD-10-CM | POA: Diagnosis not present

## 2017-11-09 DIAGNOSIS — J45909 Unspecified asthma, uncomplicated: Secondary | ICD-10-CM | POA: Diagnosis not present

## 2017-11-09 DIAGNOSIS — R351 Nocturia: Secondary | ICD-10-CM | POA: Diagnosis not present

## 2017-11-09 DIAGNOSIS — N401 Enlarged prostate with lower urinary tract symptoms: Secondary | ICD-10-CM | POA: Diagnosis not present

## 2017-11-09 DIAGNOSIS — E1143 Type 2 diabetes mellitus with diabetic autonomic (poly)neuropathy: Secondary | ICD-10-CM | POA: Diagnosis not present

## 2017-11-09 DIAGNOSIS — M6281 Muscle weakness (generalized): Secondary | ICD-10-CM | POA: Diagnosis not present

## 2017-11-09 DIAGNOSIS — I10 Essential (primary) hypertension: Secondary | ICD-10-CM | POA: Diagnosis not present

## 2017-11-09 DIAGNOSIS — M150 Primary generalized (osteo)arthritis: Secondary | ICD-10-CM | POA: Diagnosis not present

## 2017-11-11 DIAGNOSIS — J45909 Unspecified asthma, uncomplicated: Secondary | ICD-10-CM | POA: Diagnosis not present

## 2017-11-11 DIAGNOSIS — I10 Essential (primary) hypertension: Secondary | ICD-10-CM | POA: Diagnosis not present

## 2017-11-11 DIAGNOSIS — M6281 Muscle weakness (generalized): Secondary | ICD-10-CM | POA: Diagnosis not present

## 2017-11-11 DIAGNOSIS — M150 Primary generalized (osteo)arthritis: Secondary | ICD-10-CM | POA: Diagnosis not present

## 2017-11-11 DIAGNOSIS — I69319 Unspecified symptoms and signs involving cognitive functions following cerebral infarction: Secondary | ICD-10-CM | POA: Diagnosis not present

## 2017-11-11 DIAGNOSIS — G63 Polyneuropathy in diseases classified elsewhere: Secondary | ICD-10-CM | POA: Diagnosis not present

## 2017-11-11 DIAGNOSIS — R351 Nocturia: Secondary | ICD-10-CM | POA: Diagnosis not present

## 2017-11-11 DIAGNOSIS — N401 Enlarged prostate with lower urinary tract symptoms: Secondary | ICD-10-CM | POA: Diagnosis not present

## 2017-11-11 DIAGNOSIS — E1143 Type 2 diabetes mellitus with diabetic autonomic (poly)neuropathy: Secondary | ICD-10-CM | POA: Diagnosis not present

## 2017-11-15 ENCOUNTER — Ambulatory Visit (INDEPENDENT_AMBULATORY_CARE_PROVIDER_SITE_OTHER): Payer: Medicare HMO | Admitting: Nurse Practitioner

## 2017-11-15 ENCOUNTER — Other Ambulatory Visit: Payer: Self-pay

## 2017-11-15 ENCOUNTER — Encounter: Payer: Self-pay | Admitting: Nurse Practitioner

## 2017-11-15 VITALS — BP 104/75 | HR 80 | Temp 98.1°F | Ht 66.0 in | Wt 159.8 lb

## 2017-11-15 DIAGNOSIS — R351 Nocturia: Secondary | ICD-10-CM

## 2017-11-15 DIAGNOSIS — M159 Polyosteoarthritis, unspecified: Secondary | ICD-10-CM

## 2017-11-15 DIAGNOSIS — N401 Enlarged prostate with lower urinary tract symptoms: Secondary | ICD-10-CM

## 2017-11-15 DIAGNOSIS — M150 Primary generalized (osteo)arthritis: Secondary | ICD-10-CM

## 2017-11-15 DIAGNOSIS — E1143 Type 2 diabetes mellitus with diabetic autonomic (poly)neuropathy: Secondary | ICD-10-CM

## 2017-11-15 DIAGNOSIS — M8949 Other hypertrophic osteoarthropathy, multiple sites: Secondary | ICD-10-CM

## 2017-11-15 DIAGNOSIS — E785 Hyperlipidemia, unspecified: Secondary | ICD-10-CM | POA: Diagnosis not present

## 2017-11-15 LAB — POCT UA - MICROALBUMIN: Microalbumin Ur, POC: 0 mg/L

## 2017-11-15 LAB — POCT GLYCOSYLATED HEMOGLOBIN (HGB A1C): Hemoglobin A1C: 6.1

## 2017-11-15 MED ORDER — DICLOFENAC SODIUM 75 MG PO TBEC: 75.0000 mg | DELAYED_RELEASE_TABLET | Freq: Two times a day (BID) | ORAL | 2 refills | Status: DC | PRN

## 2017-11-15 NOTE — Patient Instructions (Addendum)
Shane Sloan,   Thank you for coming in to clinic today.  1. Restart diclofenac 75 mg twice daily, but ONLY as needed.  2. No other changes to your medications.  3. Your provider would like to you have your annual eye exam. Please contact your current eye doctor or here are some good options for you to contact.   [facility]   Address: [address] [place], [place] [address] Phone: [phone]  Website: [email or web address]   [facility] [address] [place], [place] [address] Phone: [phone] [email or web address]  [facility]  Address: [address] [place], [place], [place] [address] Phone: [phone]   [facility] [address] [place], [place] [address] Phone: [phone]  [facility] Address: [address] [place], [place], [place] [address]  Phone: [phone]   4. You will be due for FASTING BLOOD WORK.  This means you should eat no food or drink after midnight.  Drink only water or coffee without cream/sugar on the morning of your lab visit.  - Please go ahead and schedule a "Lab Only" visit in the morning at the clinic for lab draw in the next 7 days. - Your results will be available about 2-3 days after blood draw.  If you have set up a MyChart account, you can can log in to MyChart online to view your results and a brief explanation. Also, we can discuss your results together at your next office visit if you would like.   Please schedule a follow-up appointment with Shane Sloan, AGNP. Return in about 3 months (around 02/15/2018) for diabetes.  If you have any other questions or concerns, please feel free to call the clinic or send a message through MyChart. You may also schedule an earlier appointment if necessary.  You will receive a survey after today's visit either digitally by e-mail or paper by [employer]. Your experiences and feedback matter to [place].  Please respond so we know how we are doing as we provide care for  you.   Shane Mcardle, DNP, AGNP-BC Adult Gerontology Nurse Practitioner [facility], [facility]

## 2017-11-15 NOTE — Progress Notes (Signed)
Subjective:    Patient ID: Shane Sloan, male    DOB: [birth year], 67 y.o.   MRN: [id number]  Shane Sloan is a 67 y.o. male presenting on 11/15/2017 for Diabetes   HPI Diabetes Pt presents today for follow up of Type 2 diabetes mellitus. He is not checking CBG at home - Current diabetic medications include: metformin - He is not currently symptomatic.  - He denies polydipsia, polyphagia, polyuria, headaches, diaphoresis, shakiness, chills and changes in vision.   - Clinical course has been unchanged. - He  reports an exercise routine that includes walking, 2 days per week. - His diet is moderate in salt, moderate in fat, and low in carbohydrates. - Weight trend: stable  PREVENTION: Eye exam current (within one year): no Foot exam current (within one year): yes  Lipid/ASCVD risk reduction - on statin: yes Kidney protection - on ace or arb: yes Recent Labs    07/13/17 1035 11/15/17 1151  HGBA1C 6.1 6.1     Polyarthralgia Pt continues to complain of multiple aching joints, especially knees bilaterally.  Pt is not taking medication regularly OTC as it did not provide relief.  Pt has had benefit with daily use of meloxicam, but has had worsening kidney function.  BPH Feels good with LUTS.  Not having any hesitancy.  Is sleeping well overnight with few episodes of nocturia per week.  No nightly nocturia.  Sleeps between 12-7am  IPSS Questionnaire (AUA-7): Over the past month.   1)  How often have you had a sensation of not emptying your bladder completely after you finish urinating?  1 - Less than 1 time in 5  2)  How often have you had to urinate again less than two hours after you finished urinating? 1 - Less than 1 time in 5  3)  How often have you found you stopped and started again several times when you urinated?  0 - Not at all  4) How difficult have you found it to postpone urination?  3 - About half the time  5) How often have you had a weak urinary stream?  0 - Not  at all  6) How often have you had to push or strain to begin urination?  0 - Not at all  7) How many times did you most typically get up to urinate from the time you went to bed until the time you got up in the morning?  0 - None  Total score:  0-7 mildly symptomatic   8-19 moderately symptomatic   20-35 severely symptomatic     Social History   Tobacco Use  . Smoking status: Never Smoker  . Smokeless tobacco: Never Used  Substance Use Topics  . Alcohol use: No  . Drug use: No    Review of Systems Per HPI unless specifically indicated above     Objective:    BP 104/75 (BP Location: Right Arm, Patient Position: Sitting, Cuff Size: Normal)   Pulse 80   Temp 98.1 F (36.7 C) (Oral)   Ht  (1.676 m)   Wt 159 lb 12.8 oz (72.5 kg)   BMI 25.79 kg/m   Wt Readings from Last 3 Encounters:  11/15/17 159 lb 12.8 oz (72.5 kg)  10/04/17 160 lb 3.2 oz (72.7 kg)  07/13/17 157 lb 6.4 oz (71.4 kg)    Physical Exam  Constitutional: He is oriented to person, place, and time. He appears well-developed and well-nourished. No distress.  Neck: Normal range of  motion. Neck supple. Carotid bruit is not present.  Cardiovascular: Normal rate, regular rhythm, S1 normal, S2 normal, normal heart sounds and intact distal pulses.  Pulmonary/Chest: Effort normal and breath sounds normal. No respiratory distress.  Musculoskeletal: He exhibits no edema (pedal).  Neurological: He is alert and oriented to person, place, and time.  Skin: Skin is warm and dry. Capillary refill takes less than 2 seconds.  Psychiatric: He has a normal mood and affect. His behavior is normal.  Vitals reviewed.     Results for orders placed or performed in visit on 11/15/17  PSA  Result Value Ref Range   PSA 0.4 < OR = 4.0 ng/mL  Lipid panel  Result Value Ref Range   Cholesterol 111 <200 mg/dL   HDL 55 >16 mg/dL   Triglycerides 84 <109 mg/dL   LDL Cholesterol (Calc) 40 mg/dL (calc)   Total CHOL/HDL Ratio 2.0 <5.0  (calc)   Non-HDL Cholesterol (Calc) 56 <604 mg/dL (calc)  COMPLETE METABOLIC PANEL WITH GFR  Result Value Ref Range   Glucose, Bld 104 (H) 65 - 99 mg/dL   BUN 11 7 - 25 mg/dL   Creat 5.40 9.81 - 1.91 mg/dL   GFR, Est Non African American 90 > OR = 60 mL/min/1.9m2   GFR, Est African American 104 > OR = 60 mL/min/1.53m2   BUN/Creatinine Ratio NOT APPLICABLE 6 - 22 (calc)   Sodium 142 135 - 146 mmol/L   Potassium 4.0 3.5 - 5.3 mmol/L   Chloride 106 98 - 110 mmol/L   CO2 29 20 - 32 mmol/L   Calcium 9.1 8.6 - 10.3 mg/dL   Total Protein 6.7 6.1 - 8.1 g/dL   Albumin 4.3 3.6 - 5.1 g/dL   Globulin 2.4 1.9 - 3.7 g/dL (calc)   AG Ratio 1.8 1.0 - 2.5 (calc)   Total Bilirubin 0.6 0.2 - 1.2 mg/dL   Alkaline phosphatase (APISO) 60 40 - 115 U/L   AST 17 10 - 35 U/L   ALT 27 9 - 46 U/L  POCT glycosylated hemoglobin (Hb A1C)  Result Value Ref Range   Hemoglobin A1C 6.1   POCT UA - Microalbumin  Result Value Ref Range   Microalbumin Ur, POC 0 mg/L   Creatinine, POC  mg/dL   Albumin/Creatinine Ratio, Urine, POC        Assessment & Plan:   Problem List Items Addressed This Visit      Endocrine   Controlled type 2 diabetes mellitus with diabetic autonomic neuropathy, without long-term current use of insulin (HCC) - Primary    Controlled today with stable POCT HgbA1c=6.1% and no home CBG readings..     Plan: 1. Dietary knowledge sufficient.  Encouraged healthy diet. 2. Continue metformin 500 mg one tablet twice daily. 3. Continue AM fasting CBG checks. 4. Labs due today.   5. Follow up 3 months.      Relevant Orders   POCT glycosylated hemoglobin (Hb A1C) (Completed)   Lipid panel (Completed)   COMPLETE METABOLIC PANEL WITH GFR (Completed)   POCT UA - Microalbumin (Completed)     Musculoskeletal and Integument   Primary osteoarthritis involving multiple joints    Pt with chronic arthritis pain of bilateral knees and Right shoulder.  Previously taking meloxicam with modest relief.   Previously had slight decrease in kidney function.  Would like to limit NSAID use if possible.  Plan:  1. Treat with OTC pain meds (acetaminophen) and Rx diclofenac 25 mg bid prn.  Discussed alternate  dosing and max dosing. 2. Apply heat and/or ice to affected area. 3. May also apply a muscle rub with lidocaine or lidocaine patch after heat or ice. 4. Check labs today for kidney function. 5. Follow up 3 months.        Relevant Medications   diclofenac (VOLTAREN) 75 MG EC tablet     Genitourinary   BPH associated with nocturia    Pt requests refill of finasteride and tamsulosin.  Pt has had no worsening of LUTS symptoms on finasteride and tamsulosin daily.  Plan: 1. Refill finasteride and tamsulosin. 2. Check labs today. 3. Followup as needed.      Relevant Orders   PSA (Completed)     Other   Hyperlipidemia    Stable at last labs.  No recent check  Pt requests refill of medication.  Refill provided.  Labs today.  NO new ASCVD events. Follow up with lipid check every 6 months.      Relevant Orders   Lipid panel (Completed)   COMPLETE METABOLIC PANEL WITH GFR (Completed)      Meds ordered this encounter  Medications  . diclofenac (VOLTAREN) 75 MG EC tablet    Sig: Take 1 tablet (75 mg total) by mouth 2 (two) times daily as needed for moderate pain.    Dispense:  45 tablet    Refill:  2    Order Specific Question:   Supervising Provider    Answer:   Smitty Cords [[id number]]    Follow up plan: Return in about 3 months (around 02/15/2018) for diabetes.  Wilhelmina Mcardle, DNP, AGPCNP-BC Adult Gerontology Primary Care Nurse Practitioner [facility] 12/03/2017, 4:55 PM

## 2017-11-16 DIAGNOSIS — M150 Primary generalized (osteo)arthritis: Secondary | ICD-10-CM | POA: Diagnosis not present

## 2017-11-16 DIAGNOSIS — G63 Polyneuropathy in diseases classified elsewhere: Secondary | ICD-10-CM | POA: Diagnosis not present

## 2017-11-16 DIAGNOSIS — M6281 Muscle weakness (generalized): Secondary | ICD-10-CM | POA: Diagnosis not present

## 2017-11-16 DIAGNOSIS — J45909 Unspecified asthma, uncomplicated: Secondary | ICD-10-CM | POA: Diagnosis not present

## 2017-11-16 DIAGNOSIS — E1143 Type 2 diabetes mellitus with diabetic autonomic (poly)neuropathy: Secondary | ICD-10-CM | POA: Diagnosis not present

## 2017-11-16 DIAGNOSIS — R351 Nocturia: Secondary | ICD-10-CM | POA: Diagnosis not present

## 2017-11-16 DIAGNOSIS — I10 Essential (primary) hypertension: Secondary | ICD-10-CM | POA: Diagnosis not present

## 2017-11-16 DIAGNOSIS — I69319 Unspecified symptoms and signs involving cognitive functions following cerebral infarction: Secondary | ICD-10-CM | POA: Diagnosis not present

## 2017-11-16 DIAGNOSIS — N401 Enlarged prostate with lower urinary tract symptoms: Secondary | ICD-10-CM | POA: Diagnosis not present

## 2017-11-19 DIAGNOSIS — J45909 Unspecified asthma, uncomplicated: Secondary | ICD-10-CM | POA: Diagnosis not present

## 2017-11-19 DIAGNOSIS — I69319 Unspecified symptoms and signs involving cognitive functions following cerebral infarction: Secondary | ICD-10-CM | POA: Diagnosis not present

## 2017-11-19 DIAGNOSIS — G63 Polyneuropathy in diseases classified elsewhere: Secondary | ICD-10-CM | POA: Diagnosis not present

## 2017-11-19 DIAGNOSIS — M150 Primary generalized (osteo)arthritis: Secondary | ICD-10-CM | POA: Diagnosis not present

## 2017-11-19 DIAGNOSIS — E1143 Type 2 diabetes mellitus with diabetic autonomic (poly)neuropathy: Secondary | ICD-10-CM | POA: Diagnosis not present

## 2017-11-19 DIAGNOSIS — M6281 Muscle weakness (generalized): Secondary | ICD-10-CM | POA: Diagnosis not present

## 2017-11-19 DIAGNOSIS — R351 Nocturia: Secondary | ICD-10-CM | POA: Diagnosis not present

## 2017-11-19 DIAGNOSIS — I10 Essential (primary) hypertension: Secondary | ICD-10-CM | POA: Diagnosis not present

## 2017-11-19 DIAGNOSIS — N401 Enlarged prostate with lower urinary tract symptoms: Secondary | ICD-10-CM | POA: Diagnosis not present

## 2017-11-22 ENCOUNTER — Other Ambulatory Visit: Payer: Medicare HMO

## 2017-11-22 DIAGNOSIS — E1143 Type 2 diabetes mellitus with diabetic autonomic (poly)neuropathy: Secondary | ICD-10-CM | POA: Diagnosis not present

## 2017-11-22 DIAGNOSIS — E785 Hyperlipidemia, unspecified: Secondary | ICD-10-CM | POA: Diagnosis not present

## 2017-11-22 DIAGNOSIS — N401 Enlarged prostate with lower urinary tract symptoms: Secondary | ICD-10-CM | POA: Diagnosis not present

## 2017-11-22 DIAGNOSIS — R351 Nocturia: Secondary | ICD-10-CM | POA: Diagnosis not present

## 2017-11-23 DIAGNOSIS — I10 Essential (primary) hypertension: Secondary | ICD-10-CM | POA: Diagnosis not present

## 2017-11-23 DIAGNOSIS — N401 Enlarged prostate with lower urinary tract symptoms: Secondary | ICD-10-CM | POA: Diagnosis not present

## 2017-11-23 DIAGNOSIS — M150 Primary generalized (osteo)arthritis: Secondary | ICD-10-CM | POA: Diagnosis not present

## 2017-11-23 DIAGNOSIS — E1143 Type 2 diabetes mellitus with diabetic autonomic (poly)neuropathy: Secondary | ICD-10-CM | POA: Diagnosis not present

## 2017-11-23 DIAGNOSIS — I69319 Unspecified symptoms and signs involving cognitive functions following cerebral infarction: Secondary | ICD-10-CM | POA: Diagnosis not present

## 2017-11-23 DIAGNOSIS — J45909 Unspecified asthma, uncomplicated: Secondary | ICD-10-CM | POA: Diagnosis not present

## 2017-11-23 DIAGNOSIS — R351 Nocturia: Secondary | ICD-10-CM | POA: Diagnosis not present

## 2017-11-23 DIAGNOSIS — G63 Polyneuropathy in diseases classified elsewhere: Secondary | ICD-10-CM | POA: Diagnosis not present

## 2017-11-23 DIAGNOSIS — M6281 Muscle weakness (generalized): Secondary | ICD-10-CM | POA: Diagnosis not present

## 2017-11-23 LAB — COMPLETE METABOLIC PANEL WITH GFR
AG Ratio: 1.8 (calc) (ref 1.0–2.5)
ALT: 27 U/L (ref 9–46)
AST: 17 U/L (ref 10–35)
Albumin: 4.3 g/dL (ref 3.6–5.1)
Alkaline phosphatase (APISO): 60 U/L (ref 40–115)
BUN: 11 mg/dL (ref 7–25)
CO2: 29 mmol/L (ref 20–32)
Calcium: 9.1 mg/dL (ref 8.6–10.3)
Chloride: 106 mmol/L (ref 98–110)
Creat: 0.87 mg/dL (ref 0.70–1.25)
GFR, Est African American: 104 mL/min/{1.73_m2} (ref >=60)
GFR, Est Non African American: 90 mL/min/{1.73_m2} (ref >=60)
Globulin: 2.4 g/dL (calc) (ref 1.9–3.7)
Glucose, Bld: 104 mg/dL — ABNORMAL HIGH (ref 65–99)
Potassium: 4 mmol/L (ref 3.5–5.3)
Sodium: 142 mmol/L (ref 135–146)
Total Bilirubin: 0.6 mg/dL (ref 0.2–1.2)
Total Protein: 6.7 g/dL (ref 6.1–8.1)

## 2017-11-23 LAB — LIPID PANEL
Cholesterol: 111 mg/dL (ref <200)
HDL: 55 mg/dL (ref >40)
LDL Cholesterol (Calc): 40 mg/dL (calc)
Non-HDL Cholesterol (Calc): 56 mg/dL (calc) (ref <130)
Total CHOL/HDL Ratio: 2 (calc) (ref <5.0)
Triglycerides: 84 mg/dL (ref <150)

## 2017-11-23 LAB — PSA: PSA: 0.4 ng/mL (ref <=4.0)

## 2017-11-26 DIAGNOSIS — J45909 Unspecified asthma, uncomplicated: Secondary | ICD-10-CM | POA: Diagnosis not present

## 2017-11-26 DIAGNOSIS — G63 Polyneuropathy in diseases classified elsewhere: Secondary | ICD-10-CM | POA: Diagnosis not present

## 2017-11-26 DIAGNOSIS — M6281 Muscle weakness (generalized): Secondary | ICD-10-CM | POA: Diagnosis not present

## 2017-11-26 DIAGNOSIS — I69319 Unspecified symptoms and signs involving cognitive functions following cerebral infarction: Secondary | ICD-10-CM | POA: Diagnosis not present

## 2017-11-26 DIAGNOSIS — N401 Enlarged prostate with lower urinary tract symptoms: Secondary | ICD-10-CM | POA: Diagnosis not present

## 2017-11-26 DIAGNOSIS — E1143 Type 2 diabetes mellitus with diabetic autonomic (poly)neuropathy: Secondary | ICD-10-CM | POA: Diagnosis not present

## 2017-11-26 DIAGNOSIS — R351 Nocturia: Secondary | ICD-10-CM | POA: Diagnosis not present

## 2017-11-26 DIAGNOSIS — I10 Essential (primary) hypertension: Secondary | ICD-10-CM | POA: Diagnosis not present

## 2017-11-26 DIAGNOSIS — M150 Primary generalized (osteo)arthritis: Secondary | ICD-10-CM | POA: Diagnosis not present

## 2017-11-29 DIAGNOSIS — G63 Polyneuropathy in diseases classified elsewhere: Secondary | ICD-10-CM | POA: Diagnosis not present

## 2017-11-29 DIAGNOSIS — N401 Enlarged prostate with lower urinary tract symptoms: Secondary | ICD-10-CM | POA: Diagnosis not present

## 2017-11-29 DIAGNOSIS — I69319 Unspecified symptoms and signs involving cognitive functions following cerebral infarction: Secondary | ICD-10-CM | POA: Diagnosis not present

## 2017-11-29 DIAGNOSIS — J45909 Unspecified asthma, uncomplicated: Secondary | ICD-10-CM | POA: Diagnosis not present

## 2017-11-29 DIAGNOSIS — M6281 Muscle weakness (generalized): Secondary | ICD-10-CM | POA: Diagnosis not present

## 2017-11-29 DIAGNOSIS — I10 Essential (primary) hypertension: Secondary | ICD-10-CM | POA: Diagnosis not present

## 2017-11-29 DIAGNOSIS — E1143 Type 2 diabetes mellitus with diabetic autonomic (poly)neuropathy: Secondary | ICD-10-CM | POA: Diagnosis not present

## 2017-11-29 DIAGNOSIS — R351 Nocturia: Secondary | ICD-10-CM | POA: Diagnosis not present

## 2017-11-29 DIAGNOSIS — M150 Primary generalized (osteo)arthritis: Secondary | ICD-10-CM | POA: Diagnosis not present

## 2017-12-03 ENCOUNTER — Encounter: Payer: Self-pay | Admitting: Nurse Practitioner

## 2017-12-03 DIAGNOSIS — M8949 Other hypertrophic osteoarthropathy, multiple sites: Secondary | ICD-10-CM | POA: Insufficient documentation

## 2017-12-03 DIAGNOSIS — M159 Polyosteoarthritis, unspecified: Secondary | ICD-10-CM | POA: Insufficient documentation

## 2017-12-03 DIAGNOSIS — M150 Primary generalized (osteo)arthritis: Secondary | ICD-10-CM

## 2017-12-03 NOTE — Assessment & Plan Note (Signed)
Pt with chronic arthritis pain of bilateral knees and Right shoulder.  Previously taking meloxicam with modest relief.  Previously had slight decrease in kidney function.  Would like to limit NSAID use if possible.  Plan:  1. Treat with OTC pain meds (acetaminophen) and Rx diclofenac 25 mg bid prn.  Discussed alternate dosing and max dosing. 2. Apply heat and/or ice to affected area. 3. May also apply a muscle rub with lidocaine or lidocaine patch after heat or ice. 4. Check labs today for kidney function. 5. Follow up 3 months.

## 2017-12-03 NOTE — Assessment & Plan Note (Addendum)
Controlled today with stable POCT HgbA1c=6.1% and no home CBG readings..     Plan: 1. Dietary knowledge sufficient.  Encouraged healthy diet. 2. Continue metformin 500 mg one tablet twice daily. 3. Continue AM fasting CBG checks. 4. Labs due today.   5. Follow up 3 months.

## 2017-12-03 NOTE — Assessment & Plan Note (Signed)
Stable at last labs.  No recent check  Pt requests refill of medication.  Refill provided.  Labs today.  NO new ASCVD events. Follow up with lipid check every 6 months.

## 2017-12-03 NOTE — Assessment & Plan Note (Signed)
Pt requests refill of finasteride and tamsulosin.  Pt has had no worsening of LUTS symptoms on finasteride and tamsulosin daily.  Plan: 1. Refill finasteride and tamsulosin. 2. Check labs today. 3. Followup as needed.

## 2017-12-16 DIAGNOSIS — M5412 Radiculopathy, cervical region: Secondary | ICD-10-CM | POA: Diagnosis not present

## 2017-12-16 DIAGNOSIS — E119 Type 2 diabetes mellitus without complications: Secondary | ICD-10-CM | POA: Diagnosis not present

## 2017-12-16 DIAGNOSIS — M5030 Other cervical disc degeneration, unspecified cervical region: Secondary | ICD-10-CM | POA: Diagnosis not present

## 2017-12-20 DIAGNOSIS — J45909 Unspecified asthma, uncomplicated: Secondary | ICD-10-CM | POA: Diagnosis not present

## 2017-12-20 DIAGNOSIS — G8929 Other chronic pain: Secondary | ICD-10-CM | POA: Diagnosis not present

## 2017-12-20 DIAGNOSIS — M25512 Pain in left shoulder: Secondary | ICD-10-CM | POA: Diagnosis not present

## 2017-12-20 DIAGNOSIS — I10 Essential (primary) hypertension: Secondary | ICD-10-CM | POA: Diagnosis not present

## 2017-12-20 DIAGNOSIS — M150 Primary generalized (osteo)arthritis: Secondary | ICD-10-CM | POA: Diagnosis not present

## 2017-12-20 DIAGNOSIS — M25511 Pain in right shoulder: Secondary | ICD-10-CM | POA: Diagnosis not present

## 2017-12-20 DIAGNOSIS — M5030 Other cervical disc degeneration, unspecified cervical region: Secondary | ICD-10-CM | POA: Diagnosis not present

## 2017-12-20 DIAGNOSIS — M5412 Radiculopathy, cervical region: Secondary | ICD-10-CM | POA: Diagnosis not present

## 2017-12-20 DIAGNOSIS — E119 Type 2 diabetes mellitus without complications: Secondary | ICD-10-CM | POA: Diagnosis not present

## 2017-12-22 DIAGNOSIS — M25512 Pain in left shoulder: Secondary | ICD-10-CM | POA: Diagnosis not present

## 2017-12-22 DIAGNOSIS — M5412 Radiculopathy, cervical region: Secondary | ICD-10-CM | POA: Diagnosis not present

## 2017-12-22 DIAGNOSIS — E119 Type 2 diabetes mellitus without complications: Secondary | ICD-10-CM | POA: Diagnosis not present

## 2017-12-22 DIAGNOSIS — M5030 Other cervical disc degeneration, unspecified cervical region: Secondary | ICD-10-CM | POA: Diagnosis not present

## 2017-12-22 DIAGNOSIS — M150 Primary generalized (osteo)arthritis: Secondary | ICD-10-CM | POA: Diagnosis not present

## 2017-12-22 DIAGNOSIS — G8929 Other chronic pain: Secondary | ICD-10-CM | POA: Diagnosis not present

## 2017-12-22 DIAGNOSIS — M25511 Pain in right shoulder: Secondary | ICD-10-CM | POA: Diagnosis not present

## 2017-12-22 DIAGNOSIS — J45909 Unspecified asthma, uncomplicated: Secondary | ICD-10-CM | POA: Diagnosis not present

## 2017-12-22 DIAGNOSIS — I10 Essential (primary) hypertension: Secondary | ICD-10-CM | POA: Diagnosis not present

## 2017-12-24 DIAGNOSIS — M5412 Radiculopathy, cervical region: Secondary | ICD-10-CM | POA: Diagnosis not present

## 2017-12-24 DIAGNOSIS — M25511 Pain in right shoulder: Secondary | ICD-10-CM | POA: Diagnosis not present

## 2017-12-24 DIAGNOSIS — I10 Essential (primary) hypertension: Secondary | ICD-10-CM | POA: Diagnosis not present

## 2017-12-24 DIAGNOSIS — M5030 Other cervical disc degeneration, unspecified cervical region: Secondary | ICD-10-CM | POA: Diagnosis not present

## 2017-12-24 DIAGNOSIS — M25512 Pain in left shoulder: Secondary | ICD-10-CM | POA: Diagnosis not present

## 2017-12-24 DIAGNOSIS — J45909 Unspecified asthma, uncomplicated: Secondary | ICD-10-CM | POA: Diagnosis not present

## 2017-12-24 DIAGNOSIS — E119 Type 2 diabetes mellitus without complications: Secondary | ICD-10-CM | POA: Diagnosis not present

## 2017-12-24 DIAGNOSIS — G8929 Other chronic pain: Secondary | ICD-10-CM | POA: Diagnosis not present

## 2017-12-24 DIAGNOSIS — M150 Primary generalized (osteo)arthritis: Secondary | ICD-10-CM | POA: Diagnosis not present

## 2017-12-27 DIAGNOSIS — I10 Essential (primary) hypertension: Secondary | ICD-10-CM | POA: Diagnosis not present

## 2017-12-27 DIAGNOSIS — J45909 Unspecified asthma, uncomplicated: Secondary | ICD-10-CM | POA: Diagnosis not present

## 2017-12-27 DIAGNOSIS — M5412 Radiculopathy, cervical region: Secondary | ICD-10-CM | POA: Diagnosis not present

## 2017-12-27 DIAGNOSIS — M25512 Pain in left shoulder: Secondary | ICD-10-CM | POA: Diagnosis not present

## 2017-12-27 DIAGNOSIS — E119 Type 2 diabetes mellitus without complications: Secondary | ICD-10-CM | POA: Diagnosis not present

## 2017-12-27 DIAGNOSIS — G8929 Other chronic pain: Secondary | ICD-10-CM | POA: Diagnosis not present

## 2017-12-27 DIAGNOSIS — M25511 Pain in right shoulder: Secondary | ICD-10-CM | POA: Diagnosis not present

## 2017-12-27 DIAGNOSIS — M150 Primary generalized (osteo)arthritis: Secondary | ICD-10-CM | POA: Diagnosis not present

## 2017-12-27 DIAGNOSIS — M5030 Other cervical disc degeneration, unspecified cervical region: Secondary | ICD-10-CM | POA: Diagnosis not present

## 2017-12-29 DIAGNOSIS — M150 Primary generalized (osteo)arthritis: Secondary | ICD-10-CM | POA: Diagnosis not present

## 2017-12-29 DIAGNOSIS — J45909 Unspecified asthma, uncomplicated: Secondary | ICD-10-CM | POA: Diagnosis not present

## 2017-12-29 DIAGNOSIS — M25512 Pain in left shoulder: Secondary | ICD-10-CM | POA: Diagnosis not present

## 2017-12-29 DIAGNOSIS — M5030 Other cervical disc degeneration, unspecified cervical region: Secondary | ICD-10-CM | POA: Diagnosis not present

## 2017-12-29 DIAGNOSIS — G8929 Other chronic pain: Secondary | ICD-10-CM | POA: Diagnosis not present

## 2017-12-29 DIAGNOSIS — M25511 Pain in right shoulder: Secondary | ICD-10-CM | POA: Diagnosis not present

## 2017-12-29 DIAGNOSIS — I10 Essential (primary) hypertension: Secondary | ICD-10-CM | POA: Diagnosis not present

## 2017-12-29 DIAGNOSIS — E119 Type 2 diabetes mellitus without complications: Secondary | ICD-10-CM | POA: Diagnosis not present

## 2017-12-29 DIAGNOSIS — M5412 Radiculopathy, cervical region: Secondary | ICD-10-CM | POA: Diagnosis not present

## 2018-01-05 DIAGNOSIS — M150 Primary generalized (osteo)arthritis: Secondary | ICD-10-CM | POA: Diagnosis not present

## 2018-01-05 DIAGNOSIS — M5030 Other cervical disc degeneration, unspecified cervical region: Secondary | ICD-10-CM | POA: Diagnosis not present

## 2018-01-05 DIAGNOSIS — M25512 Pain in left shoulder: Secondary | ICD-10-CM | POA: Diagnosis not present

## 2018-01-05 DIAGNOSIS — E119 Type 2 diabetes mellitus without complications: Secondary | ICD-10-CM | POA: Diagnosis not present

## 2018-01-05 DIAGNOSIS — J45909 Unspecified asthma, uncomplicated: Secondary | ICD-10-CM | POA: Diagnosis not present

## 2018-01-05 DIAGNOSIS — M5412 Radiculopathy, cervical region: Secondary | ICD-10-CM | POA: Diagnosis not present

## 2018-01-05 DIAGNOSIS — I10 Essential (primary) hypertension: Secondary | ICD-10-CM | POA: Diagnosis not present

## 2018-01-05 DIAGNOSIS — M25511 Pain in right shoulder: Secondary | ICD-10-CM | POA: Diagnosis not present

## 2018-01-05 DIAGNOSIS — G8929 Other chronic pain: Secondary | ICD-10-CM | POA: Diagnosis not present

## 2018-01-07 DIAGNOSIS — M25511 Pain in right shoulder: Secondary | ICD-10-CM | POA: Diagnosis not present

## 2018-01-07 DIAGNOSIS — G8929 Other chronic pain: Secondary | ICD-10-CM | POA: Diagnosis not present

## 2018-01-07 DIAGNOSIS — M5412 Radiculopathy, cervical region: Secondary | ICD-10-CM | POA: Diagnosis not present

## 2018-01-07 DIAGNOSIS — E119 Type 2 diabetes mellitus without complications: Secondary | ICD-10-CM | POA: Diagnosis not present

## 2018-01-07 DIAGNOSIS — M150 Primary generalized (osteo)arthritis: Secondary | ICD-10-CM | POA: Diagnosis not present

## 2018-01-07 DIAGNOSIS — J45909 Unspecified asthma, uncomplicated: Secondary | ICD-10-CM | POA: Diagnosis not present

## 2018-01-07 DIAGNOSIS — M5030 Other cervical disc degeneration, unspecified cervical region: Secondary | ICD-10-CM | POA: Diagnosis not present

## 2018-01-07 DIAGNOSIS — I10 Essential (primary) hypertension: Secondary | ICD-10-CM | POA: Diagnosis not present

## 2018-01-07 DIAGNOSIS — M25512 Pain in left shoulder: Secondary | ICD-10-CM | POA: Diagnosis not present

## 2018-01-12 DIAGNOSIS — M25511 Pain in right shoulder: Secondary | ICD-10-CM | POA: Diagnosis not present

## 2018-01-12 DIAGNOSIS — E119 Type 2 diabetes mellitus without complications: Secondary | ICD-10-CM | POA: Diagnosis not present

## 2018-01-12 DIAGNOSIS — M5030 Other cervical disc degeneration, unspecified cervical region: Secondary | ICD-10-CM | POA: Diagnosis not present

## 2018-01-12 DIAGNOSIS — G8929 Other chronic pain: Secondary | ICD-10-CM | POA: Diagnosis not present

## 2018-01-12 DIAGNOSIS — M25512 Pain in left shoulder: Secondary | ICD-10-CM | POA: Diagnosis not present

## 2018-01-12 DIAGNOSIS — M5412 Radiculopathy, cervical region: Secondary | ICD-10-CM | POA: Diagnosis not present

## 2018-01-12 DIAGNOSIS — I10 Essential (primary) hypertension: Secondary | ICD-10-CM | POA: Diagnosis not present

## 2018-01-12 DIAGNOSIS — J45909 Unspecified asthma, uncomplicated: Secondary | ICD-10-CM | POA: Diagnosis not present

## 2018-01-12 DIAGNOSIS — M150 Primary generalized (osteo)arthritis: Secondary | ICD-10-CM | POA: Diagnosis not present

## 2018-01-14 DIAGNOSIS — G8929 Other chronic pain: Secondary | ICD-10-CM | POA: Diagnosis not present

## 2018-01-14 DIAGNOSIS — I10 Essential (primary) hypertension: Secondary | ICD-10-CM | POA: Diagnosis not present

## 2018-01-14 DIAGNOSIS — M5412 Radiculopathy, cervical region: Secondary | ICD-10-CM | POA: Diagnosis not present

## 2018-01-14 DIAGNOSIS — M25511 Pain in right shoulder: Secondary | ICD-10-CM | POA: Diagnosis not present

## 2018-01-14 DIAGNOSIS — J45909 Unspecified asthma, uncomplicated: Secondary | ICD-10-CM | POA: Diagnosis not present

## 2018-01-14 DIAGNOSIS — M150 Primary generalized (osteo)arthritis: Secondary | ICD-10-CM | POA: Diagnosis not present

## 2018-01-14 DIAGNOSIS — E119 Type 2 diabetes mellitus without complications: Secondary | ICD-10-CM | POA: Diagnosis not present

## 2018-01-14 DIAGNOSIS — M5030 Other cervical disc degeneration, unspecified cervical region: Secondary | ICD-10-CM | POA: Diagnosis not present

## 2018-01-14 DIAGNOSIS — M25512 Pain in left shoulder: Secondary | ICD-10-CM | POA: Diagnosis not present

## 2018-01-21 ENCOUNTER — Ambulatory Visit (INDEPENDENT_AMBULATORY_CARE_PROVIDER_SITE_OTHER): Payer: Medicare HMO | Admitting: Family Medicine

## 2018-01-21 ENCOUNTER — Encounter: Payer: Self-pay | Admitting: Family Medicine

## 2018-01-21 ENCOUNTER — Other Ambulatory Visit: Payer: Self-pay

## 2018-01-21 VITALS — BP 110/75 | HR 86 | Temp 98.3°F | Ht 66.0 in | Wt 158.6 lb

## 2018-01-21 DIAGNOSIS — E1143 Type 2 diabetes mellitus with diabetic autonomic (poly)neuropathy: Secondary | ICD-10-CM | POA: Diagnosis not present

## 2018-01-21 DIAGNOSIS — M5412 Radiculopathy, cervical region: Secondary | ICD-10-CM | POA: Diagnosis not present

## 2018-01-21 DIAGNOSIS — G63 Polyneuropathy in diseases classified elsewhere: Secondary | ICD-10-CM | POA: Diagnosis not present

## 2018-01-21 DIAGNOSIS — M150 Primary generalized (osteo)arthritis: Secondary | ICD-10-CM

## 2018-01-21 DIAGNOSIS — M159 Polyosteoarthritis, unspecified: Secondary | ICD-10-CM

## 2018-01-21 MED ORDER — PREDNISONE 20 MG PO TABS: ORAL_TABLET | ORAL | 0 refills | Status: DC

## 2018-01-21 MED ORDER — CYCLOBENZAPRINE HCL 10 MG PO TABS: 10.0000 mg | ORAL_TABLET | Freq: Three times a day (TID) | ORAL | 0 refills | Status: DC | PRN

## 2018-01-21 NOTE — Progress Notes (Signed)
Subjective:    Patient ID: Shane Sloan, male    DOB: 1951/01/04, 67 y.o.   MRN: [id number]  Roshad Hack is a 67 y.o. male presenting on 01/21/2018 for Peripheral Neuropathy (bilateral feet and hands. x 51yr. Pt describe it as a burning pain in the feet. Pain in the hands and weakness, unable to grip thing. )  Patient's PCP is Wilhelmina Mcardle, AGPCNP-BC. I am covering for her while she is out of office. This is my first meeting with this patient.  HPI   Follow-up Osteoarthritis Multiple Joints / Cervical DDD with Radiculopathy Last seen by PCP on 11/15/17 for similar issues with chronic joint pain, see prior notes and medical record for his past history on this issue, he has tried and failed multiple medications including various NSAIDs Diclofenac, Meloxicam. He has been on muscle relaxants as well including Flexeril. He has received Prednisone taper with temporary relief. Gabapentin trial for neuropathy as well was helping but less now. He has chronic problems >1 year. He was sent to [facility] back in 10/2017 and has seen them twice, last visit 12/2017, he had C-spine x-rays, he was advised of OA/DDD C-spine affecting his neck and shoulder pain and limiting him, he has prior stroke as well affecting his weakness he was given Meloxicam 7.5mg  at that time and continued on Flexeril and referred to Physical Therapy for C-spine, they advised him that if not improving he is to follow-up and proceed with an MRI of C-spine. - Patient has not followed up since with Ortho, and he was unaware of possible MRI in future. He has not completed PT. - He complains of worsening pain and difficulty functioning with shoulders and neuropathy - He is asking about change of medicines and new medicine for these problems - he did get relief from prednisone temporarily with the flexeril - Of note he did have problem with NSAID due to kidney insufficiency in past. - Denies any new fall or injury or other joint  pain, persistent numbness tingling weakness in arms or hands  Peripheral Neuropathy Additional related concern, chronic problem >1 year by his report, this is not new or worsening now. He presents for re-evaluation of same issue that has been evaluated by PCP in past. He has been taking Gabapentin long term and it helped some initially now less. He is asking about Neurology or Lyrica rx at this time. - He sees Carmon Ginsberg, he is interested to see [facility] PMH - Type 2 diabetes - likely affecting his neuropathy  Depression screen [facility] 2/9 11/15/2017 09/25/2016  Decreased Interest 0 1  Down, Depressed, Hopeless 0 0  PHQ - 2 Score 0 1    Social History   Tobacco Use  . Smoking status: Never Smoker  . Smokeless tobacco: Never Used  Substance Use Topics  . Alcohol use: No  . Drug use: No    Review of Systems Per HPI unless specifically indicated above     Objective:    BP 110/75 (BP Location: Right Arm, Patient Position: Sitting, Cuff Size: Normal)   Pulse 86   Temp 98.3 F (36.8 C) (Oral)   Ht 5\' 6"  (1.676 m)   Wt 158 lb 9.6 oz (71.9 kg)   BMI 25.60 kg/m   Wt Readings from Last 3 Encounters:  01/21/18 158 lb 9.6 oz (71.9 kg)  11/15/17 159 lb 12.8 oz (72.5 kg)  10/04/17 160 lb 3.2 oz (72.7 kg)    Physical Exam  Constitutional: He is  oriented to person, place, and time. He appears well-developed and well-nourished. No distress.  Chronically ill appearing, uncomfortable with some shoulder arm weakness and discomfort, cooperative, ambulates with cane cautiously  HENT:  Head: Normocephalic and atraumatic.  Mouth/Throat: Oropharynx is clear and moist.  Eyes: Conjunctivae are normal. Right eye exhibits no discharge. Left eye exhibits no discharge.  Neck:  Neck Inspection / palpation: increased muscle hypertonicity bilateral upper cervical paraspinal muscles R>L, non tender to palpation ROM: full active range of motion with neck Special Testing: Spurling's maneuver  negative for radiculopathy today Strength: distal grip is mostly preserved Neurovascular: distal intact currently without loss of sensation   Cardiovascular: Normal rate.  Pulmonary/Chest: Effort normal.  Musculoskeletal: He exhibits no edema.  Bilateral Shoulders Reduced range of motion above shoulder length, some weakness on rotator cuff testing limited by discomfort.  Neurological: He is alert and oriented to person, place, and time.  Skin: Skin is warm and dry. No rash noted. He is not diaphoretic. No erythema.  Psychiatric: He has a normal mood and affect. His behavior is normal.  Well groomed, good eye contact, normal speech and thoughts  Nursing note and vitals reviewed.  Results for orders placed or performed in visit on 11/15/17  PSA  Result Value Ref Range   PSA 0.4 < OR = 4.0 ng/mL  Lipid panel  Result Value Ref Range   Cholesterol 111 <200 mg/dL   HDL 55 >54>40 mg/dL   Triglycerides 84 <098<150 mg/dL   LDL Cholesterol (Calc) 40 mg/dL (calc)   Total CHOL/HDL Ratio 2.0 <5.0 (calc)   Non-HDL Cholesterol (Calc) 56 <119<130 mg/dL (calc)  COMPLETE METABOLIC PANEL WITH GFR  Result Value Ref Range   Glucose, Bld 104 (H) 65 - 99 mg/dL   BUN 11 7 - 25 mg/dL   Creat 1.470.87 8.290.70 - 5.621.25 mg/dL   GFR, Est Non African American 90 > OR = 60 mL/min/1.5173m2   GFR, Est African American 104 > OR = 60 mL/min/1.7573m2   BUN/Creatinine Ratio NOT APPLICABLE 6 - 22 (calc)   Sodium 142 135 - 146 mmol/L   Potassium 4.0 3.5 - 5.3 mmol/L   Chloride 106 98 - 110 mmol/L   CO2 29 20 - 32 mmol/L   Calcium 9.1 8.6 - 10.3 mg/dL   Total Protein 6.7 6.1 - 8.1 g/dL   Albumin 4.3 3.6 - 5.1 g/dL   Globulin 2.4 1.9 - 3.7 g/dL (calc)   AG Ratio 1.8 1.0 - 2.5 (calc)   Total Bilirubin 0.6 0.2 - 1.2 mg/dL   Alkaline phosphatase (APISO) 60 40 - 115 U/L   AST 17 10 - 35 U/L   ALT 27 9 - 46 U/L  POCT glycosylated hemoglobin (Hb A1C)  Result Value Ref Range   Hemoglobin A1C 6.1   POCT UA - Microalbumin  Result Value  Ref Range   Microalbumin Ur, POC 0 mg/L   Creatinine, POC  mg/dL   Albumin/Creatinine Ratio, Urine, POC        Assessment & Plan:   Problem List Items Addressed This Visit    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  [facility] can coordinate to determine which if any MRI testing would be beneficial for him, primarily C-spine at this time.     Relevant Medications   predniSONE (DELTASONE) 20 MG tablet   cyclobenzaprine (FLEXERIL) 10 MG tablet   Primary osteoarthritis involving multiple joints See below A&P   Relevant Medications   meloxicam (MOBIC) 7.5 MG tablet   predniSONE (DELTASONE) 20 MG tablet   cyclobenzaprine (FLEXERIL) 10 MG tablet    Other Visit Diagnoses    Cervical radiculopathy     Likely primary etiology for chronic neck and shoulder pain with radiating radiculopathy causing some weakness in upper extremities - Already followed by [facility], and has been on max med therapy in past - I have limited else to offer him for this chronic problem today, we discussed importance of returning to his [facility] to review limited progress with meds and PT and next step is MRI of C-spine - he needs to contact them - I did agree to provide temporary coverage again with a Prednisone burst taper over 7 days, and repeat 1-2 weeks of Flexeril since these offered temporary relief in past, he was cautioned on sedation    Relevant Medications   predniSONE (DELTASONE) 20 MG tablet     cyclobenzaprine (FLEXERIL) 10 MG tablet      Meds ordered this encounter  Medications  . predniSONE (DELTASONE) 20 MG tablet    Sig: Take daily with food. Start with 60mg  (3 pills) x 2 days, then reduce to 40mg  (2 pills) x 2 days, then 20mg  (1 pill) x 3 days    Dispense:  13 tablet    Refill:  0  . cyclobenzaprine (FLEXERIL) 10 MG tablet    Sig: Take 1 tablet (10 mg total) by mouth 3 (three) times daily as needed for muscle spasms.    Dispense:  30 tablet    Refill:  0    Orders Placed This Encounter  Procedures  . Ambulatory referral to Neurology    Referral Priority:   Routine    Referral Type:   Consultation    Referral Reason:   Specialty Services Required    Requested Specialty:   Neurology    Number of Visits Requested:   1    Follow up plan: Return in about 6 weeks (around 03/04/2018), or if symptoms worsen or fail to improve, for F/u Neuro / Ortho / Neuropathy / Cervical Radiculopathy.   Saralyn Pilar, DO [facility] 01/21/2018, 1:10 PM

## 2018-01-21 NOTE — Patient Instructions (Addendum)
Thank you for coming to the office today.  [facility] - they can discuss nerve testing and possibly other neuropathy treatments such as Pregabalin or Lyrica.  [facility] [address] [place], [place] [address] Phone: [phone]  --------------------------------------------------  May also contact [facility] to let them know treatment was not helpful and you may need the MRI - either they can or the Neurologist can order the MRI  Start Prednisone taper over 7 days - do not take with other anti inflammatories (advil, ibuprofen, meloxicam)  Start Cyclobenzapine (Flexeril) 10mg  tablets (muscle relaxant) - start with half (cut) to one whole pill at night for muscle relaxant - may make you sedated or sleepy (be careful driving or working on this) if tolerated you can take half to whole tab 2 to 3 times daily or every 8 hours as needed   Please schedule a Follow-up Appointment to: Return in about 6 weeks (around 03/04/2018), or if symptoms worsen or fail to improve, for F/u Neuro / Ortho / Neuropathy / Cervical Radiculopathy.  If you have any other questions or concerns, please feel free to call the office or send a message through MyChart. You may also schedule an earlier appointment if necessary.  Additionally, you may be receiving a survey about your experience at our office within a few days to 1 week by e-mail or mail. We value your feedback.  Saralyn PilarAlexander Diani Jillson, DO [facility]

## 2018-02-15 ENCOUNTER — Telehealth: Payer: Self-pay | Admitting: Nurse Practitioner

## 2018-02-15 ENCOUNTER — Other Ambulatory Visit: Payer: Self-pay

## 2018-02-15 DIAGNOSIS — G63 Polyneuropathy in diseases classified elsewhere: Secondary | ICD-10-CM

## 2018-02-15 MED ORDER — GABAPENTIN 300 MG PO CAPS: 600.0000 mg | ORAL_CAPSULE | Freq: Three times a day (TID) | ORAL | 5 refills | Status: DC

## 2018-02-15 NOTE — Telephone Encounter (Signed)
The pt called requesting a refill of his gabapentin.

## 2018-02-15 NOTE — Telephone Encounter (Signed)
[facility] [place]   gabapentin (NEURONTIN) 300 MG capsule [[id number]][[id number]]

## 2018-02-21 ENCOUNTER — Ambulatory Visit: Payer: Medicare HMO | Admitting: Nurse Practitioner

## 2018-02-25 ENCOUNTER — Ambulatory Visit: Payer: Medicare HMO | Admitting: Nurse Practitioner

## 2018-02-25 DIAGNOSIS — G629 Polyneuropathy, unspecified: Secondary | ICD-10-CM | POA: Diagnosis not present

## 2018-02-25 DIAGNOSIS — M5412 Radiculopathy, cervical region: Secondary | ICD-10-CM | POA: Diagnosis not present

## 2018-03-10 ENCOUNTER — Other Ambulatory Visit: Payer: Self-pay | Admitting: Nurse Practitioner

## 2018-03-10 DIAGNOSIS — G63 Polyneuropathy in diseases classified elsewhere: Secondary | ICD-10-CM

## 2018-04-01 ENCOUNTER — Encounter: Payer: Self-pay | Admitting: Nurse Practitioner

## 2018-04-01 ENCOUNTER — Other Ambulatory Visit: Payer: Self-pay

## 2018-04-01 ENCOUNTER — Ambulatory Visit (INDEPENDENT_AMBULATORY_CARE_PROVIDER_SITE_OTHER): Payer: Medicare HMO | Admitting: Nurse Practitioner

## 2018-04-01 VITALS — BP 113/76 | HR 88 | Temp 98.1°F | Ht 66.0 in | Wt 146.8 lb

## 2018-04-01 DIAGNOSIS — L298 Other pruritus: Secondary | ICD-10-CM

## 2018-04-01 DIAGNOSIS — B356 Tinea cruris: Secondary | ICD-10-CM

## 2018-04-01 DIAGNOSIS — J301 Allergic rhinitis due to pollen: Secondary | ICD-10-CM | POA: Diagnosis not present

## 2018-04-01 MED ORDER — LORATADINE 10 MG PO TABS: 10.0000 mg | ORAL_TABLET | Freq: Every day | ORAL | Status: DC

## 2018-04-01 MED ORDER — CLOTRIMAZOLE 1 % EX CREA: 1.0000 "application " | TOPICAL_CREAM | Freq: Two times a day (BID) | CUTANEOUS | 0 refills | Status: DC

## 2018-04-01 NOTE — Progress Notes (Signed)
Subjective:    Patient ID: Shane Sloan, male    DOB: [birth year], 67 y.o.   MRN: [id number]  Shane Sloan is a 67 y.o. male presenting on 04/01/2018 for Dysphagia (difficulty swallowing food and water, sore throat x 1 mth ) and Jock itch (x )   HPI Dysphagia Throat is slightly sore, phlegm, difficult to swallow food/water/pills - this is worse at night.   Occasionally has cough at night.  This wakes him from sleep.  Worse with lying down.  Denies heartburn, but is always worse at night. Symptoms have been present for about 1 month.     Jock Itch Patient has had symptoms x 1 month.  Skin is red, sore, itching, No drainage.   - Has used warm water cleansing without soap 2x per day. - Has not had this problem before. - No significant pain.   Social History   Tobacco Use  . Smoking status: Never Smoker  . Smokeless tobacco: Never Used  Substance Use Topics  . Alcohol use: No  . Drug use: No    Review of Systems Per HPI unless specifically indicated above     Objective:    BP 113/76 (BP Location: Right Arm, Patient Position: Sitting, Cuff Size: Normal)   Pulse 88   Temp 98.1 F (36.7 C) (Oral)   Ht 5\' 6"  (1.676 m)   Wt 146 lb 12.8 oz (66.6 kg)   BMI 23.69 kg/m   Wt Readings from Last 3 Encounters:  04/01/18 146 lb 12.8 oz (66.6 kg)  01/21/18 158 lb 9.6 oz (71.9 kg)  11/15/17 159 lb 12.8 oz (72.5 kg)    Physical Exam  Constitutional: He is oriented to person, place, and time. He appears well-developed and well-nourished. No distress.  HENT:  Head: Normocephalic and atraumatic.  Right Ear: Tympanic membrane, external ear and ear canal normal. Decreased hearing is noted.  Left Ear: Tympanic membrane, external ear and ear canal normal. Decreased hearing is noted.  Nose: Nose normal.  Mouth/Throat: Uvula is midline, oropharynx is clear and moist and mucous membranes are normal. Tonsils are 0 on the right. Tonsils are 0 on the left.  Cardiovascular: Normal rate,  regular rhythm, S1 normal, S2 normal, normal heart sounds and intact distal pulses.  Pulmonary/Chest: Effort normal and breath sounds normal. No respiratory distress.  Genitourinary:  Genitourinary Comments: Genital and Rectal Exam chaperoned by Laurel Dimmer, CMA Genital: penis normal shape without lesions or urethral discharge, scrotum intact without masses, spermatic cords palpated without edema or tenderness,epididymis normal without swelling or tenderness bilateral testicles descended equal in size and non-tender to palpation.  No inguinal hernia or lymphadenopathy. SKIN erythematous with confluent macules.  No flakiness or breakdown of skin integrity.  Neurological: He is alert and oriented to person, place, and time.  Skin: Skin is warm and dry.  Psychiatric: He has a normal mood and affect. His behavior is normal.  Vitals reviewed.    Results for orders placed or performed in visit on 11/15/17  PSA  Result Value Ref Range   PSA 0.4 < OR = 4.0 ng/mL  Lipid panel  Result Value Ref Range   Cholesterol 111 <200 mg/dL   HDL 55 >40 mg/dL   Triglycerides 84 <981 mg/dL   LDL Cholesterol (Calc) 40 mg/dL (calc)   Total CHOL/HDL Ratio 2.0 <5.0 (calc)   Non-HDL Cholesterol (Calc) 56 <191 mg/dL (calc)  COMPLETE METABOLIC PANEL WITH GFR  Result Value Ref Range   Glucose, Bld 104 (H) 65 -  99 mg/dL   BUN 11 7 - 25 mg/dL   Creat 7.42 5.95 - 6.38 mg/dL   GFR, Est Non African American 90 > OR = 60 mL/min/1.70m2   GFR, Est African American 104 > OR = 60 mL/min/1.75m2   BUN/Creatinine Ratio NOT APPLICABLE 6 - 22 (calc)   Sodium 142 135 - 146 mmol/L   Potassium 4.0 3.5 - 5.3 mmol/L   Chloride 106 98 - 110 mmol/L   CO2 29 20 - 32 mmol/L   Calcium 9.1 8.6 - 10.3 mg/dL   Total Protein 6.7 6.1 - 8.1 g/dL   Albumin 4.3 3.6 - 5.1 g/dL   Globulin 2.4 1.9 - 3.7 g/dL (calc)   AG Ratio 1.8 1.0 - 2.5 (calc)   Total Bilirubin 0.6 0.2 - 1.2 mg/dL   Alkaline phosphatase (APISO) 60 40 - 115 U/L   AST  17 10 - 35 U/L   ALT 27 9 - 46 U/L  POCT glycosylated hemoglobin (Hb A1C)  Result Value Ref Range   Hemoglobin A1C 6.1   POCT UA - Microalbumin  Result Value Ref Range   Microalbumin Ur, POC 0 mg/L   Creatinine, POC  mg/dL   Albumin/Creatinine Ratio, Urine, POC        Assessment & Plan:   Problem List Items Addressed This Visit             Other Visit Diagnoses    Jock itch    -  Primary Candidal infection of groin.  Inadequate conservative therapy.  Plan: 1. Start lotrisone ointment bid x 7 days. 2. Keep supportive underwear 3. Keep clean and dry skin. 4. Follow-up prn.    Seasonal allergic rhinitis due to pollen     Patient states he has difficulty swallowing, but has phlegm at opening of throat.  Likely is allergic rhinitis and is not adequately treated, seasonal. - Today no evidence of acute sinusitis or complication.  - Has not recently used loratadine, cetirizine, flonase  Plan: 1. Consider starting nasal fluticasone 2 sprays each nostril once daily for 4 weeks. 2. START antihistamine loratadine 10 mg once daily. 3. Follow-up in future, as needed, consider 2nd opinion from ENT/allergy.   Relevant Medications   loratadine (CLARITIN) 10 MG tablet      Meds ordered this encounter  Medications  .  clotrimazole (LOTRIMIN) 1 % cream    Sig: Apply 1 application topically 2 (two) times daily.    Dispense:  30 g    Refill:  0    Order Specific Question:   Supervising Provider    Answer:   Smitty Cords [[id number]]  . loratadine (CLARITIN) 10 MG tablet    Sig: Take 1 tablet (10 mg total) by mouth daily.    Order Specific Question:   Supervising Provider    Answer:   Smitty Cords [[id number]]    Follow up plan: Return in about 5 weeks (around 05/06/2018) for diabetes, cholesterol.  Wilhelmina Mcardle, DNP, AGPCNP-BC Adult Gerontology Primary Care Nurse Practitioner [facility] 04/01/2018, 12:00 PM

## 2018-04-01 NOTE — Patient Instructions (Addendum)
Shane Sloan,   Thank you for coming in to clinic today.  1. You have allergic rhinitis, causing mucus to drain into throat at bedtime.   - START Claritin again.  2. Groin has moisture irritation and Jock Itch - START Lotrisone ointment twice daily for 7 days, then STOP.  Please schedule a follow-up appointment with Wilhelmina Mcardle, AGNP. Return in about 5 weeks (around 05/06/2018) for diabetes, cholesterol.  If you have any other questions or concerns, please feel free to call the clinic or send a message through MyChart. You may also schedule an earlier appointment if necessary.  You will receive a survey after today's visit either digitally by e-mail or paper by [employer]. Your experiences and feedback matter to [place].  Please respond so we know how we are doing as we provide care for you.   Wilhelmina Mcardle, DNP, AGNP-BC Adult Gerontology Nurse Practitioner [facility], [facility]

## 2018-04-04 ENCOUNTER — Telehealth: Payer: Self-pay | Admitting: Nurse Practitioner

## 2018-04-04 DIAGNOSIS — R351 Nocturia: Secondary | ICD-10-CM

## 2018-04-04 DIAGNOSIS — I10 Essential (primary) hypertension: Secondary | ICD-10-CM

## 2018-04-04 DIAGNOSIS — E1143 Type 2 diabetes mellitus with diabetic autonomic (poly)neuropathy: Secondary | ICD-10-CM

## 2018-04-04 DIAGNOSIS — N401 Enlarged prostate with lower urinary tract symptoms: Secondary | ICD-10-CM

## 2018-04-04 DIAGNOSIS — E785 Hyperlipidemia, unspecified: Secondary | ICD-10-CM

## 2018-04-04 MED ORDER — FINASTERIDE 5 MG PO TABS: 5.0000 mg | ORAL_TABLET | Freq: Every day | ORAL | 0 refills | Status: DC

## 2018-04-04 MED ORDER — ATORVASTATIN CALCIUM 20 MG PO TABS: 20.0000 mg | ORAL_TABLET | Freq: Every day | ORAL | 0 refills | Status: DC

## 2018-04-04 MED ORDER — AMLODIPINE BESYLATE 5 MG PO TABS: 5.0000 mg | ORAL_TABLET | Freq: Every day | ORAL | 0 refills | Status: DC

## 2018-04-04 MED ORDER — TAMSULOSIN HCL 0.4 MG PO CAPS: 0.4000 mg | ORAL_CAPSULE | Freq: Every day | ORAL | 0 refills | Status: DC

## 2018-04-04 MED ORDER — METFORMIN HCL 500 MG PO TABS: ORAL_TABLET | ORAL | 0 refills | Status: DC

## 2018-04-04 NOTE — Telephone Encounter (Signed)
Pt needs refills on amlodipine, pregabalin, metformin, tamsulosin, finasteride, atorvastatin, clotrimazole.  [facility] [place]  Thanks

## 2018-04-06 ENCOUNTER — Other Ambulatory Visit: Payer: Self-pay | Admitting: Nurse Practitioner

## 2018-04-06 DIAGNOSIS — M159 Polyosteoarthritis, unspecified: Secondary | ICD-10-CM

## 2018-04-06 DIAGNOSIS — M150 Primary generalized (osteo)arthritis: Principal | ICD-10-CM

## 2018-04-06 MED ORDER — DICLOFENAC SODIUM 75 MG PO TBEC: 75.0000 mg | DELAYED_RELEASE_TABLET | Freq: Two times a day (BID) | ORAL | 2 refills | Status: DC | PRN

## 2018-04-06 NOTE — Addendum Note (Signed)
Addended by: Elvina Mattes D on: 04/06/2018 12:03 PM   Modules accepted: Orders

## 2018-04-06 NOTE — Telephone Encounter (Signed)
Pt needs a refill on diclofenac °

## 2018-04-25 DIAGNOSIS — R200 Anesthesia of skin: Secondary | ICD-10-CM | POA: Diagnosis not present

## 2018-04-25 DIAGNOSIS — M79642 Pain in left hand: Secondary | ICD-10-CM | POA: Diagnosis not present

## 2018-04-25 DIAGNOSIS — R29898 Other symptoms and signs involving the musculoskeletal system: Secondary | ICD-10-CM | POA: Diagnosis not present

## 2018-04-25 DIAGNOSIS — M79641 Pain in right hand: Secondary | ICD-10-CM | POA: Diagnosis not present

## 2018-05-09 ENCOUNTER — Encounter: Payer: Self-pay | Admitting: Nurse Practitioner

## 2018-05-09 ENCOUNTER — Ambulatory Visit (INDEPENDENT_AMBULATORY_CARE_PROVIDER_SITE_OTHER): Payer: Medicare HMO | Admitting: Nurse Practitioner

## 2018-05-09 VITALS — BP 116/83 | HR 96 | Temp 98.3°F | Ht 66.0 in | Wt 154.0 lb

## 2018-05-09 DIAGNOSIS — E782 Mixed hyperlipidemia: Secondary | ICD-10-CM

## 2018-05-09 DIAGNOSIS — R351 Nocturia: Secondary | ICD-10-CM

## 2018-05-09 DIAGNOSIS — E1143 Type 2 diabetes mellitus with diabetic autonomic (poly)neuropathy: Secondary | ICD-10-CM

## 2018-05-09 DIAGNOSIS — E118 Type 2 diabetes mellitus with unspecified complications: Secondary | ICD-10-CM | POA: Diagnosis not present

## 2018-05-09 DIAGNOSIS — I10 Essential (primary) hypertension: Secondary | ICD-10-CM | POA: Diagnosis not present

## 2018-05-09 DIAGNOSIS — B353 Tinea pedis: Secondary | ICD-10-CM | POA: Diagnosis not present

## 2018-05-09 DIAGNOSIS — R1310 Dysphagia, unspecified: Secondary | ICD-10-CM

## 2018-05-09 DIAGNOSIS — Z23 Encounter for immunization: Secondary | ICD-10-CM | POA: Diagnosis not present

## 2018-05-09 DIAGNOSIS — N401 Enlarged prostate with lower urinary tract symptoms: Secondary | ICD-10-CM | POA: Diagnosis not present

## 2018-05-09 LAB — POCT GLYCOSYLATED HEMOGLOBIN (HGB A1C): Hemoglobin A1C: 5.7 % — AB (ref 4.0–5.6)

## 2018-05-09 MED ORDER — METFORMIN HCL 500 MG PO TABS: 500.0000 mg | ORAL_TABLET | Freq: Every day | ORAL | 1 refills | Status: DC

## 2018-05-09 MED ORDER — ATORVASTATIN CALCIUM 20 MG PO TABS: 20.0000 mg | ORAL_TABLET | Freq: Every day | ORAL | 1 refills | Status: AC

## 2018-05-09 MED ORDER — AMLODIPINE BESYLATE 5 MG PO TABS: 5.0000 mg | ORAL_TABLET | Freq: Every day | ORAL | 1 refills | Status: DC

## 2018-05-09 MED ORDER — FINASTERIDE 5 MG PO TABS: 5.0000 mg | ORAL_TABLET | Freq: Every day | ORAL | 1 refills | Status: DC

## 2018-05-09 MED ORDER — GABAPENTIN 400 MG PO CAPS: 400.0000 mg | ORAL_CAPSULE | Freq: Three times a day (TID) | ORAL | 1 refills | Status: DC

## 2018-05-09 MED ORDER — CLOTRIMAZOLE 1 % EX CREA: 1.0000 "application " | TOPICAL_CREAM | Freq: Two times a day (BID) | CUTANEOUS | 0 refills | Status: DC

## 2018-05-09 MED ORDER — TAMSULOSIN HCL 0.4 MG PO CAPS: 0.4000 mg | ORAL_CAPSULE | Freq: Every day | ORAL | 0 refills | Status: DC

## 2018-05-09 NOTE — Progress Notes (Signed)
Subjective:    Patient ID: Shane Sloan, male    DOB: 09-25-1950, 67 y.o.   MRN: [id number]  Shane Sloan is a 67 y.o. male presenting on 05/09/2018 for Diabetes   HPI Diabetes Pt presents today for follow up of Type 2 diabetes mellitus. He is not checking CBG at home - Current diabetic medications include: metformin - He is not currently symptomatic, but does endorse polyneuropathy with numbness and burning in feet and toes.  - He denies polydipsia, polyphagia, polyuria, headaches, diaphoresis, shakiness, chills and changes in vision.   - Clinical course has been stable. - Weight trend: stable, fluctuating a bit  PREVENTION: Eye exam current (within one year): no - has been 2 years ago. Foot exam current (within one year): no - due today Lipid/ASCVD risk reduction - on statin: Yes Kidney protection - on ace or arb: No Recent Labs    07/13/17 1035 11/15/17 1151 05/09/18 1127  HGBA1C 6.1 6.1 5.7*   Hyperlipidemia Patient is currently taking atorvastatin 20 mg once daily without myalgias or arthralgias.  He also has no cognitive deficits not attributed to residual stroke effects. - Pt denies changes in vision, chest tightness/pressure, palpitations, shortness of breath, leg pain while walking, leg or arm weakness, and sudden loss of speech or loss of consciousness.   Polyneuropathy Patient has stopped Lyrica and returned to gabapentin 400 mg tid as he states his Lyrica dose was not correct.  Gabapentin has started working for him again, however.  He does still have nerve pain regularly.    Hypertension - He is not checking BP at home or outside of clinic.    - Current medications: amlodipine 5 mg once daily, tolerating well without side effects - He is not currently symptomatic. - Pt denies headache, lightheadedness, dizziness, changes in vision, chest tightness/pressure, palpitations, leg swelling, sudden loss of speech or loss of consciousness. - He  reports no regular  exercise routine.  Does walk some and uses cane for balance only when he is outside.  Does not use in his home. - His diet is high in salt, high in fat, and high in carbohydrates.   Dysphagia Patient states is difficult to swallow food.  Denies aspiration.  He states it is hard to swallow anything including liquids, solids, pills.  He does not elaborate on whether food feels like it gets stuck in his throat when asked.  BPH Patient reports no difficulties with starting urine stream, urinary frequency, or urinary urgency.  Social History   Tobacco Use  . Smoking status: Never Smoker  . Smokeless tobacco: Never Used  Substance Use Topics  . Alcohol use: No  . Drug use: No    Review of Systems Per HPI unless specifically indicated above     Objective:    BP 116/83   Pulse 96   Temp 98.3 F (36.8 C) (Oral)   Ht 5\' 6"  (1.676 m)   Wt 154 lb (69.9 kg)   BMI 24.86 kg/m   Wt Readings from Last 3 Encounters:  05/09/18 154 lb (69.9 kg)  04/01/18 146 lb 12.8 oz (66.6 kg)  01/21/18 158 lb 9.6 oz (71.9 kg)    Physical Exam  Constitutional: He is oriented to person, place, and time. He appears well-developed and well-nourished. No distress.  HENT:  Head: Normocephalic and atraumatic.  Neck: Normal range of motion. Neck supple. Carotid bruit is not present.  Cardiovascular: Normal rate, regular rhythm, S1 normal, S2 normal, normal heart sounds and intact  distal pulses.  Pulmonary/Chest: Effort normal and breath sounds normal. No respiratory distress.  Musculoskeletal: He exhibits no edema (pedal).  Neurological: He is alert and oriented to person, place, and time.  Skin: Skin is warm and dry. Capillary refill takes less than 2 seconds.  Psychiatric: He has a normal mood and affect. His behavior is normal. Judgment and thought content normal.  Vitals reviewed.  Diabetic Foot Exam - Simple   Simple Foot Form Diabetic Foot exam was performed with the following findings:  Yes  05/09/2018 11:30 AM  Visual Inspection See comments:  Yes Sensation Testing Intact to touch and monofilament testing bilaterally:  Yes Pulse Check Posterior Tibialis and Dorsalis pulse intact bilaterally:  Yes Comments Patient with flaky, white appearance to interdigital spaces.  Nails elongated without proper trimming.     Results for orders placed or performed in visit on 05/09/18  PSA  Result Value Ref Range   PSA 0.3 < OR = 4.0 ng/mL  Lipid panel  Result Value Ref Range   Cholesterol 119 <200 mg/dL   HDL 51 >16 mg/dL   Triglycerides 79 <109 mg/dL   LDL Cholesterol (Calc) 52 mg/dL (calc)   Total CHOL/HDL Ratio 2.3 <5.0 (calc)   Non-HDL Cholesterol (Calc) 68 <604 mg/dL (calc)  COMPLETE METABOLIC PANEL WITH GFR  Result Value Ref Range   Glucose, Bld 107 (H) 65 - 99 mg/dL   BUN 11 7 - 25 mg/dL   Creat 5.40 9.81 - 1.91 mg/dL   GFR, Est Non African American 90 > OR = 60 mL/min/1.76m2   GFR, Est African American 104 > OR = 60 mL/min/1.18m2   BUN/Creatinine Ratio NOT APPLICABLE 6 - 22 (calc)   Sodium 139 135 - 146 mmol/L   Potassium 3.8 3.5 - 5.3 mmol/L   Chloride 103 98 - 110 mmol/L   CO2 29 20 - 32 mmol/L   Calcium 9.4 8.6 - 10.3 mg/dL   Total Protein 6.8 6.1 - 8.1 g/dL   Albumin 4.5 3.6 - 5.1 g/dL   Globulin 2.3 1.9 - 3.7 g/dL (calc)   AG Ratio 2.0 1.0 - 2.5 (calc)   Total Bilirubin 0.8 0.2 - 1.2 mg/dL   Alkaline phosphatase (APISO) 46 40 - 115 U/L   AST 16 10 - 35 U/L   ALT 15 9 - 46 U/L  POCT glycosylated hemoglobin (Hb A1C)  Result Value Ref Range   Hemoglobin A1C 5.7 (A) 4.0 - 5.6 %   HbA1c POC (<> result, manual entry)     HbA1c, POC (prediabetic range)     HbA1c, POC (controlled diabetic range)        Assessment & Plan:   Problem List Items Addressed This Visit      Cardiovascular and Mediastinum   Hypertension Controlled hypertension.  BP goal < 130/80.  Pt is working on lifestyle modifications.  Taking medications tolerating well without side effects.     Plan: 1. Continue taking amlodipine 5 mg once daily 2. Obtain labs today  3. Encouraged heart healthy diet and increasing exercise to 30 minutes most days of the week. 4. Check BP 1-2 x per week at home, keep log, and bring to clinic at next appointment. 5. Follow up 3 months.     Relevant Medications   atorvastatin (LIPITOR) 20 MG tablet   amLODipine (NORVASC) 5 MG tablet   Other Relevant Orders   Lipid panel (Completed)   COMPLETE METABOLIC PANEL WITH GFR (Completed)     Endocrine   Controlled  type 2 diabetes mellitus with diabetic autonomic neuropathy, without long-term current use of insulin (HCC) - Primary ControlledDM with A1c 5.7 improved from 6.1 and goal A1c < 7.0%. - Complications - peripheral neuropathy.  Plan:  1. Change therapy:  REDUCE metformin to 500 mg daily with breakfast. 2. Encourage improved lifestyle: - low carb/low glycemic diet reinforced prior education - Increase physical activity to 30 minutes most days of the week.  Explained that increased physical activity increases body's use of sugar for energy. 3. Check fasting am CBG and bring log to next visit for review 4. Continue ASA and Statin 5. DM Foot exam done today with abnormal findings.   and Advised to schedule DM ophtho exam, send record.  Patient with tinea pedis. 6. Follow-up 3 months    Relevant Medications   metFORMIN (GLUCOPHAGE) 500 MG tablet   atorvastatin (LIPITOR) 20 MG tablet   Other Relevant Orders   POCT glycosylated hemoglobin (Hb A1C) (Completed)   Lipid panel (Completed)   COMPLETE METABOLIC PANEL WITH GFR (Completed)     Other   Hyperlipidemia Previously stable and at goals on labs. Patient with ongoing transportation difficulties, so will get lipid panel non-fasting today.  Patient tolerates atorvastatin without side effects. - Past CVA, no new ASCVD events or signs and symptoms   Plan: 1. Continue atorvastatin 20 mg once daily 2. Labs today. 3. Follow-up 3 mos   Relevant  Medications   atorvastatin (LIPITOR) 20 MG tablet   amLODipine (NORVASC) 5 MG tablet   Other Relevant Orders   Lipid panel (Completed)   BPH associated with nocturia Patient with currently stable BPH.  Requests refills and needs lab monitoring of PSA with proscar.  Plan: 1. Refill medications and continue Flomax and Proscar. 2. PSA today. 3. Continue followup with urology prn.   Relevant Medications   tamsulosin (FLOMAX) 0.4 MG CAPS capsule   finasteride (PROSCAR) 5 MG tablet   Other Relevant Orders   PSA (Completed)    Other Visit Diagnoses    Dysphagia, unspecified type     Likely oral or oropharyngeal phase dysphagia.  Unknown and could be related to esophageal stricture. - Barium swallow ordered - Can consider GI referral in future if needed. - Follow-up 3 months or sooner if needed.   Relevant Orders   DG Esophagus   Tinea pedis of both feet     Patient with acute tinea pedis.  Complicated by T2DM.   Plan: 1. START clotrimazole ointment OTC bid x 14 days. 2. Can consider future podiatry referral if needed.   Relevant Medications   clotrimazole (LOTRIMIN) 1 % cream   Needs flu shot     Pt > age 72.  Needs annual influenza vaccine.  Plan: 1. Administer high dose fluzone today.    Relevant Orders   Flu vaccine HIGH DOSE PF (Fluzone High dose) (Completed)      Meds ordered this encounter  Medications  . metFORMIN (GLUCOPHAGE) 500 MG tablet    Sig: Take 1 tablet (500 mg total) by mouth daily with breakfast.    Dispense:  90 tablet    Refill:  1    Order Specific Question:   Supervising Provider    Answer:   Smitty Cords [[id number]]  . tamsulosin (FLOMAX) 0.4 MG CAPS capsule    Sig: Take 1 capsule (0.4 mg total) by mouth daily. At the same time each day    Dispense:  90 capsule    Refill:  0  Order Specific Question:   Supervising Provider    Answer:   Smitty Cords [[id number]]  . gabapentin (NEURONTIN) 400 MG capsule    Sig: Take 1 capsule (400  mg total) by mouth 3 (three) times daily.    Dispense:  270 capsule    Refill:  1    Order Specific Question:   Supervising Provider    Answer:   Smitty Cords [[id number]]  . finasteride (PROSCAR) 5 MG tablet    Sig: Take 1 tablet (5 mg total) by mouth daily.    Dispense:  90 tablet    Refill:  1    Order Specific Question:   Supervising Provider    Answer:   Smitty Cords [[id number]]  . atorvastatin (LIPITOR) 20 MG tablet    Sig: Take 1 tablet (20 mg total) by mouth daily.    Dispense:  90 tablet    Refill:  1    Order Specific Question:   Supervising Provider    Answer:   Smitty Cords [[id number]]  . amLODipine (NORVASC) 5 MG tablet    Sig: Take 1 tablet (5 mg total) by mouth daily.    Dispense:  90 tablet    Refill:  1    Order Specific Question:   Supervising Provider    Answer:   Smitty Cords [[id number]]  . clotrimazole (LOTRIMIN) 1 % cream    Sig: Apply 1 application topically 2 (two) times daily.    Dispense:  30 g    Refill:  0    Order Specific Question:   Supervising Provider    Answer:   Smitty Cords [[id number]]    Follow up plan: Return in about 3 months (around 08/09/2018) for diabetes.  Wilhelmina Mcardle, DNP, AGPCNP-BC Adult Gerontology Primary Care Nurse Practitioner [facility] 05/09/2018, 11:12 AM

## 2018-05-09 NOTE — Patient Instructions (Addendum)
Shane Sloan,   Thank you for coming in to clinic today.  1. Your provider would like to you have your annual eye exam. Please contact your current eye doctor or here are some good options for you to contact.   [facility]   Address: [address] [place], [place] [address] Phone: [phone]  Website: [email or web address]   [facility] [address], [place], [place] [address] Phone: [phone]  [email or web address]  [facility]  Address: [address] [place], [place], [place] [address] Phone: [phone]   [facility] [address] [place], [place] [address] Phone: [phone]  [facility] Address: [address] [place], [place], [place] [address]  Phone: [phone]   2. Changes to medications: - REDUCE metformin to 500 mg once daily with  Breakfast. - Continue with change back to gabapentin 400 mg three times daily.  STOP Lyrica. - STOP Lotrisone on your groin. - START Lotrimin over the counter spray for athlete's foot on your feet.  Use this twice daily until the white flaky skin on your toes is gone.  - Continue all other medications without change.   3. For your trouble swallowing, Imaging for where you have problems will be done.   - They will call to schedule this at [facility] imaging center.   Please schedule a follow-up appointment with Wilhelmina Mcardle, AGNP. Return in about 3 months (around 08/09/2018) for diabetes.   If you have any other questions or concerns, please feel free to call the clinic or send a message through MyChart. You may also schedule an earlier appointment if necessary.  You will receive a survey after today's visit either digitally by e-mail or paper by [employer]. Your experiences and feedback matter to [place].  Please respond so we know how we are doing as we provide care for you.   Wilhelmina Mcardle, DNP, AGNP-BC Adult Gerontology Nurse Practitioner [facility], [facility]   Barium Swallow A  barium swallow is an X-ray exam that is used to evaluate the area at the back of the throat (pharynx) and the tube that carries food and liquid from the mouth to the stomach (esophagus). For this exam, you will swallow a white chalky liquid called barium. X-rays are done while the barium passes through the areas that are being checked. The barium shows up well on X-rays, making it easier for your health care provider to see possible problems. A barium swallow may be done to check for various problems, such as:  Ulcers.  Tumors.  Inflammation of the esophagus.  Hiatal hernia. This is a condition in which the upper part of the stomach slides into the lower chest.  Scarring.  Blockages.  Problems with the muscular wall of the pharynx and esophagus.  Your health care provider may recommend this procedure to help make a diagnosis if you have any of these symptoms:  Difficulty swallowing.  Chest pain that is not related to the heart.  Gastroesophageal reflux, which is a backward flow of stomach contents into the esophagus.  Unexplained vomiting.  Severe indigestion.  Tell a health care provider about:  Any allergies you have, especially allergies to contrast materials.  All medicines you are taking, including vitamins, herbs, eye drops, creams, and over-the-counter medicines.  Any blood disorders you have.  Any surgeries you have had.  Any medical conditions you have.  If you are pregnant or you think that you may be pregnant. What are the  risks? Generally, this is a safe procedure. However, problems may occur, including:  Constipation.  Fecal impaction.  Exposure to radiation (a small amount).  Allergic reaction to the barium. This is rare.  What happens before the procedure?  Follow your health care provider's instructions about eating or drinking restrictions.  Ask your health care provider about changing or stopping your regular medicines. This is especially  important if you are taking diabetes medicines or blood thinners. What happens during the procedure?  You will be positioned on an X-ray table.  You will be asked to drink the liquid barium, which looks like a light-colored milkshake. You will probably drink the barium through a straw.  During the procedure, the X-ray table may be moved to a more upright angle. You may also be asked to shift your position on the table. This will allow your entire esophagus to be viewed.  The health care provider will watch the barium flow through your esophagus using a type of X-ray that allows images to be viewed on a monitor in a movie-like sequence (fluoroscopy). X-ray images will also be stored for later viewing. The procedure may vary among health care providers and hospitals. What happens after the procedure?  Return to your normal activities and your normal diet as directed by your health care provider.  Your stool (feces) may be white or gray for 2-3 days until all of the barium has passed out of your body in your stool. You may be given a laxative to take to help remove the barium from your body.  Your health care provider may recommend other things to help prevent constipation after this procedure, including: ? Drinking enough fluid to keep your urine clear or pale yellow. ? Eating foods that are high in fiber, such as fruits, vegetables, whole grains, and beans.  Call your health care provider if: ? You have difficulty having bowel movements, or you are not able to have a bowel movement or to pass gas. ? You have belly (abdominal) pain or swelling of your abdomen. ? You have a fever.  It is your responsibility to obtain your test results. Ask your health care provider or the department performing the test when and how you will get your results. This information is not intended to replace advice given to you by your health care provider. Make sure you discuss any questions you have with your  health care provider. Document Released: 11/03/2006 Document Revised: 11/28/2015 Document Reviewed: 04/03/2014 Elsevier Interactive Patient Education  [employer].

## 2018-05-10 LAB — COMPLETE METABOLIC PANEL WITH GFR
AG Ratio: 2 (calc) (ref 1.0–2.5)
ALT: 15 U/L (ref 9–46)
AST: 16 U/L (ref 10–35)
Albumin: 4.5 g/dL (ref 3.6–5.1)
Alkaline phosphatase (APISO): 46 U/L (ref 40–115)
BUN: 11 mg/dL (ref 7–25)
CO2: 29 mmol/L (ref 20–32)
Calcium: 9.4 mg/dL (ref 8.6–10.3)
Chloride: 103 mmol/L (ref 98–110)
Creat: 0.87 mg/dL (ref 0.70–1.25)
GFR, Est African American: 104 mL/min/{1.73_m2} (ref >=60)
GFR, Est Non African American: 90 mL/min/{1.73_m2} (ref >=60)
Globulin: 2.3 g/dL (calc) (ref 1.9–3.7)
Glucose, Bld: 107 mg/dL — ABNORMAL HIGH (ref 65–99)
Potassium: 3.8 mmol/L (ref 3.5–5.3)
Sodium: 139 mmol/L (ref 135–146)
Total Bilirubin: 0.8 mg/dL (ref 0.2–1.2)
Total Protein: 6.8 g/dL (ref 6.1–8.1)

## 2018-05-10 LAB — LIPID PANEL
Cholesterol: 119 mg/dL (ref <200)
HDL: 51 mg/dL (ref >40)
LDL Cholesterol (Calc): 52 mg/dL (calc)
Non-HDL Cholesterol (Calc): 68 mg/dL (calc) (ref <130)
Total CHOL/HDL Ratio: 2.3 (calc) (ref <5.0)
Triglycerides: 79 mg/dL (ref <150)

## 2018-05-10 LAB — PSA: PSA: 0.3 ng/mL (ref <=4.0)

## 2018-05-12 ENCOUNTER — Encounter: Payer: Self-pay | Admitting: Nurse Practitioner

## 2018-05-15 DIAGNOSIS — R0902 Hypoxemia: Secondary | ICD-10-CM | POA: Diagnosis not present

## 2018-05-15 DIAGNOSIS — M6281 Muscle weakness (generalized): Secondary | ICD-10-CM | POA: Diagnosis not present

## 2018-05-15 DIAGNOSIS — R569 Unspecified convulsions: Secondary | ICD-10-CM | POA: Diagnosis not present

## 2018-05-15 DIAGNOSIS — N401 Enlarged prostate with lower urinary tract symptoms: Secondary | ICD-10-CM | POA: Diagnosis not present

## 2018-05-15 DIAGNOSIS — I21A1 Myocardial infarction type 2: Secondary | ICD-10-CM | POA: Diagnosis not present

## 2018-05-15 DIAGNOSIS — R531 Weakness: Secondary | ICD-10-CM | POA: Diagnosis not present

## 2018-05-15 DIAGNOSIS — R1313 Dysphagia, pharyngeal phase: Secondary | ICD-10-CM | POA: Diagnosis not present

## 2018-05-15 DIAGNOSIS — R000 Tachycardia, unspecified: Secondary | ICD-10-CM | POA: Diagnosis not present

## 2018-05-15 DIAGNOSIS — R5381 Other malaise: Secondary | ICD-10-CM | POA: Diagnosis not present

## 2018-05-15 DIAGNOSIS — M47812 Spondylosis without myelopathy or radiculopathy, cervical region: Secondary | ICD-10-CM | POA: Diagnosis not present

## 2018-05-15 DIAGNOSIS — S199XXA Unspecified injury of neck, initial encounter: Secondary | ICD-10-CM | POA: Diagnosis not present

## 2018-05-15 DIAGNOSIS — I872 Venous insufficiency (chronic) (peripheral): Secondary | ICD-10-CM | POA: Diagnosis not present

## 2018-05-15 DIAGNOSIS — K264 Chronic or unspecified duodenal ulcer with hemorrhage: Secondary | ICD-10-CM | POA: Diagnosis not present

## 2018-05-15 DIAGNOSIS — R1312 Dysphagia, oropharyngeal phase: Secondary | ICD-10-CM | POA: Diagnosis not present

## 2018-05-15 DIAGNOSIS — K219 Gastro-esophageal reflux disease without esophagitis: Secondary | ICD-10-CM | POA: Diagnosis not present

## 2018-05-15 DIAGNOSIS — R7881 Bacteremia: Secondary | ICD-10-CM | POA: Diagnosis not present

## 2018-05-15 DIAGNOSIS — K2980 Duodenitis without bleeding: Secondary | ICD-10-CM | POA: Diagnosis not present

## 2018-05-15 DIAGNOSIS — R4702 Dysphasia: Secondary | ICD-10-CM | POA: Diagnosis not present

## 2018-05-15 DIAGNOSIS — B964 Proteus (mirabilis) (morganii) as the cause of diseases classified elsewhere: Secondary | ICD-10-CM | POA: Diagnosis not present

## 2018-05-15 DIAGNOSIS — I959 Hypotension, unspecified: Secondary | ICD-10-CM | POA: Diagnosis not present

## 2018-05-15 DIAGNOSIS — A4159 Other Gram-negative sepsis: Secondary | ICD-10-CM | POA: Diagnosis not present

## 2018-05-15 DIAGNOSIS — Z8673 Personal history of transient ischemic attack (TIA), and cerebral infarction without residual deficits: Secondary | ICD-10-CM | POA: Diagnosis not present

## 2018-05-15 DIAGNOSIS — S0993XA Unspecified injury of face, initial encounter: Secondary | ICD-10-CM | POA: Diagnosis not present

## 2018-05-15 DIAGNOSIS — I4891 Unspecified atrial fibrillation: Secondary | ICD-10-CM | POA: Diagnosis not present

## 2018-05-15 DIAGNOSIS — R079 Chest pain, unspecified: Secondary | ICD-10-CM | POA: Diagnosis not present

## 2018-05-15 DIAGNOSIS — E861 Hypovolemia: Secondary | ICD-10-CM | POA: Diagnosis not present

## 2018-05-15 DIAGNOSIS — R0603 Acute respiratory distress: Secondary | ICD-10-CM | POA: Diagnosis not present

## 2018-05-15 DIAGNOSIS — R918 Other nonspecific abnormal finding of lung field: Secondary | ICD-10-CM | POA: Diagnosis not present

## 2018-05-15 DIAGNOSIS — E872 Acidosis: Secondary | ICD-10-CM | POA: Diagnosis not present

## 2018-05-15 DIAGNOSIS — K254 Chronic or unspecified gastric ulcer with hemorrhage: Secondary | ICD-10-CM | POA: Diagnosis not present

## 2018-05-15 DIAGNOSIS — D649 Anemia, unspecified: Secondary | ICD-10-CM | POA: Diagnosis not present

## 2018-05-15 DIAGNOSIS — Z931 Gastrostomy status: Secondary | ICD-10-CM | POA: Diagnosis not present

## 2018-05-15 DIAGNOSIS — K2981 Duodenitis with bleeding: Secondary | ICD-10-CM | POA: Diagnosis not present

## 2018-05-15 DIAGNOSIS — Z01810 Encounter for preprocedural cardiovascular examination: Secondary | ICD-10-CM | POA: Diagnosis not present

## 2018-05-15 DIAGNOSIS — K259 Gastric ulcer, unspecified as acute or chronic, without hemorrhage or perforation: Secondary | ICD-10-CM | POA: Diagnosis not present

## 2018-05-15 DIAGNOSIS — R4182 Altered mental status, unspecified: Secondary | ICD-10-CM | POA: Diagnosis not present

## 2018-05-15 DIAGNOSIS — D72829 Elevated white blood cell count, unspecified: Secondary | ICD-10-CM | POA: Diagnosis not present

## 2018-05-15 DIAGNOSIS — G9340 Encephalopathy, unspecified: Secondary | ICD-10-CM | POA: Diagnosis not present

## 2018-05-15 DIAGNOSIS — R633 Feeding difficulties: Secondary | ICD-10-CM | POA: Diagnosis not present

## 2018-05-15 DIAGNOSIS — E119 Type 2 diabetes mellitus without complications: Secondary | ICD-10-CM | POA: Diagnosis not present

## 2018-05-15 DIAGNOSIS — M50222 Other cervical disc displacement at C5-C6 level: Secondary | ICD-10-CM | POA: Diagnosis not present

## 2018-05-15 DIAGNOSIS — K921 Melena: Secondary | ICD-10-CM | POA: Diagnosis not present

## 2018-05-15 DIAGNOSIS — R471 Dysarthria and anarthria: Secondary | ICD-10-CM | POA: Diagnosis not present

## 2018-05-15 DIAGNOSIS — W19XXXA Unspecified fall, initial encounter: Secondary | ICD-10-CM | POA: Diagnosis not present

## 2018-05-15 DIAGNOSIS — R1310 Dysphagia, unspecified: Secondary | ICD-10-CM | POA: Diagnosis not present

## 2018-05-15 DIAGNOSIS — R279 Unspecified lack of coordination: Secondary | ICD-10-CM | POA: Diagnosis not present

## 2018-05-15 DIAGNOSIS — R9089 Other abnormal findings on diagnostic imaging of central nervous system: Secondary | ICD-10-CM | POA: Diagnosis not present

## 2018-05-15 DIAGNOSIS — R5383 Other fatigue: Secondary | ICD-10-CM | POA: Diagnosis not present

## 2018-05-15 DIAGNOSIS — K922 Gastrointestinal hemorrhage, unspecified: Secondary | ICD-10-CM | POA: Diagnosis not present

## 2018-05-15 DIAGNOSIS — D62 Acute posthemorrhagic anemia: Secondary | ICD-10-CM | POA: Diagnosis not present

## 2018-05-15 DIAGNOSIS — R9389 Abnormal findings on diagnostic imaging of other specified body structures: Secondary | ICD-10-CM | POA: Diagnosis not present

## 2018-05-15 DIAGNOSIS — E870 Hyperosmolality and hypernatremia: Secondary | ICD-10-CM | POA: Diagnosis not present

## 2018-05-15 DIAGNOSIS — K228 Other specified diseases of esophagus: Secondary | ICD-10-CM | POA: Diagnosis not present

## 2018-05-15 DIAGNOSIS — Z4682 Encounter for fitting and adjustment of non-vascular catheter: Secondary | ICD-10-CM | POA: Diagnosis not present

## 2018-05-15 DIAGNOSIS — R05 Cough: Secondary | ICD-10-CM | POA: Diagnosis not present

## 2018-05-15 DIAGNOSIS — I639 Cerebral infarction, unspecified: Secondary | ICD-10-CM | POA: Diagnosis not present

## 2018-05-15 DIAGNOSIS — Z452 Encounter for adjustment and management of vascular access device: Secondary | ICD-10-CM | POA: Diagnosis not present

## 2018-05-15 DIAGNOSIS — R109 Unspecified abdominal pain: Secondary | ICD-10-CM | POA: Diagnosis not present

## 2018-05-15 DIAGNOSIS — N179 Acute kidney failure, unspecified: Secondary | ICD-10-CM | POA: Diagnosis not present

## 2018-05-15 DIAGNOSIS — A419 Sepsis, unspecified organism: Secondary | ICD-10-CM | POA: Diagnosis not present

## 2018-05-15 DIAGNOSIS — K6389 Other specified diseases of intestine: Secondary | ICD-10-CM | POA: Diagnosis not present

## 2018-05-15 DIAGNOSIS — R4781 Slurred speech: Secondary | ICD-10-CM | POA: Diagnosis not present

## 2018-05-15 DIAGNOSIS — G9341 Metabolic encephalopathy: Secondary | ICD-10-CM | POA: Diagnosis not present

## 2018-05-17 MED ORDER — METOPROLOL TARTRATE 25 MG PO TABS
12.50 | ORAL_TABLET | ORAL | Status: DC
Start: 2018-05-18 — End: 2018-05-17

## 2018-05-17 MED ORDER — OXYCODONE HCL 5 MG PO TABS
2.50 | ORAL_TABLET | ORAL | Status: DC
Start: ? — End: 2018-05-17

## 2018-05-17 MED ORDER — ATORVASTATIN CALCIUM 20 MG PO TABS
20.00 | ORAL_TABLET | ORAL | Status: DC
Start: 2018-05-18 — End: 2018-05-17

## 2018-05-17 MED ORDER — ENOXAPARIN SODIUM 40 MG/0.4ML ~~LOC~~ SOLN
40.00 | SUBCUTANEOUS | Status: DC
Start: 2018-05-18 — End: 2018-05-17

## 2018-05-17 MED ORDER — ACETAMINOPHEN 650 MG/20.3ML PO SOLN
1000.00 | ORAL | Status: DC
Start: ? — End: 2018-05-17

## 2018-05-17 MED ORDER — CEFAZOLIN SODIUM-DEXTROSE 2-3 GM-%(50ML) IV SOLR
2.00 | INTRAVENOUS | Status: DC
Start: 2018-05-18 — End: 2018-05-17

## 2018-05-17 MED ORDER — DEXTROSE 10 % IV SOLN
12.50 | INTRAVENOUS | Status: DC
Start: ? — End: 2018-05-17

## 2018-05-17 MED ORDER — ASPIRIN 81 MG PO CHEW
81.00 | CHEWABLE_TABLET | ORAL | Status: DC
Start: 2018-05-18 — End: 2018-05-17

## 2018-05-17 MED ORDER — INFLUENZA VAC SPLIT QUAD 0.5 ML IM SUSY
0.50 | PREFILLED_SYRINGE | INTRAMUSCULAR | Status: DC
Start: ? — End: 2018-05-17

## 2018-05-17 MED ORDER — INSULIN REGULAR HUMAN 100 UNIT/ML IJ SOLN
0.00 | INTRAMUSCULAR | Status: DC
Start: 2018-05-18 — End: 2018-05-17

## 2018-05-17 MED ORDER — PNEUMOCOCCAL VAC POLYVALENT 25 MCG/0.5ML IJ INJ
0.50 | INJECTION | INTRAMUSCULAR | Status: DC
Start: ? — End: 2018-05-17

## 2018-05-17 MED ORDER — AMIODARONE HCL 200 MG PO TABS
400.00 | ORAL_TABLET | ORAL | Status: DC
Start: 2018-05-17 — End: 2018-05-17

## 2018-05-17 MED ORDER — POLYETHYLENE GLYCOL 3350 17 G PO PACK
17.00 | PACK | ORAL | Status: DC
Start: 2018-05-18 — End: 2018-05-17

## 2018-05-27 ENCOUNTER — Encounter: Payer: Self-pay | Admitting: Nurse Practitioner

## 2018-05-27 DIAGNOSIS — M6281 Muscle weakness (generalized): Secondary | ICD-10-CM | POA: Diagnosis not present

## 2018-05-27 DIAGNOSIS — I10 Essential (primary) hypertension: Secondary | ICD-10-CM | POA: Diagnosis not present

## 2018-05-27 DIAGNOSIS — K254 Chronic or unspecified gastric ulcer with hemorrhage: Secondary | ICD-10-CM | POA: Diagnosis not present

## 2018-05-27 DIAGNOSIS — R5381 Other malaise: Secondary | ICD-10-CM | POA: Diagnosis not present

## 2018-05-27 DIAGNOSIS — K668 Other specified disorders of peritoneum: Secondary | ICD-10-CM | POA: Diagnosis not present

## 2018-05-27 DIAGNOSIS — K2980 Duodenitis without bleeding: Secondary | ICD-10-CM | POA: Diagnosis not present

## 2018-05-27 DIAGNOSIS — I4891 Unspecified atrial fibrillation: Secondary | ICD-10-CM | POA: Diagnosis not present

## 2018-05-27 DIAGNOSIS — Z7984 Long term (current) use of oral hypoglycemic drugs: Secondary | ICD-10-CM | POA: Diagnosis not present

## 2018-05-27 DIAGNOSIS — E785 Hyperlipidemia, unspecified: Secondary | ICD-10-CM | POA: Diagnosis not present

## 2018-05-27 DIAGNOSIS — G9340 Encephalopathy, unspecified: Secondary | ICD-10-CM | POA: Diagnosis not present

## 2018-05-27 DIAGNOSIS — L89819 Pressure ulcer of head, unspecified stage: Secondary | ICD-10-CM | POA: Diagnosis not present

## 2018-05-27 DIAGNOSIS — G9341 Metabolic encephalopathy: Secondary | ICD-10-CM | POA: Diagnosis not present

## 2018-05-27 DIAGNOSIS — I639 Cerebral infarction, unspecified: Secondary | ICD-10-CM | POA: Diagnosis not present

## 2018-05-27 DIAGNOSIS — Z7982 Long term (current) use of aspirin: Secondary | ICD-10-CM | POA: Diagnosis not present

## 2018-05-27 DIAGNOSIS — Z79899 Other long term (current) drug therapy: Secondary | ICD-10-CM | POA: Diagnosis not present

## 2018-05-27 DIAGNOSIS — R1312 Dysphagia, oropharyngeal phase: Secondary | ICD-10-CM | POA: Diagnosis not present

## 2018-05-27 DIAGNOSIS — K2981 Duodenitis with bleeding: Secondary | ICD-10-CM | POA: Diagnosis not present

## 2018-05-27 DIAGNOSIS — K9429 Other complications of gastrostomy: Secondary | ICD-10-CM | POA: Diagnosis not present

## 2018-05-27 DIAGNOSIS — K219 Gastro-esophageal reflux disease without esophagitis: Secondary | ICD-10-CM | POA: Diagnosis not present

## 2018-05-27 DIAGNOSIS — N179 Acute kidney failure, unspecified: Secondary | ICD-10-CM | POA: Diagnosis not present

## 2018-05-27 DIAGNOSIS — T8189XA Other complications of procedures, not elsewhere classified, initial encounter: Secondary | ICD-10-CM | POA: Diagnosis not present

## 2018-05-27 DIAGNOSIS — Z931 Gastrostomy status: Secondary | ICD-10-CM | POA: Diagnosis not present

## 2018-05-27 DIAGNOSIS — B964 Proteus (mirabilis) (morganii) as the cause of diseases classified elsewhere: Secondary | ICD-10-CM | POA: Diagnosis not present

## 2018-05-27 DIAGNOSIS — K9423 Gastrostomy malfunction: Secondary | ICD-10-CM | POA: Diagnosis not present

## 2018-05-27 DIAGNOSIS — N401 Enlarged prostate with lower urinary tract symptoms: Secondary | ICD-10-CM | POA: Diagnosis not present

## 2018-05-27 DIAGNOSIS — J45909 Unspecified asthma, uncomplicated: Secondary | ICD-10-CM | POA: Diagnosis not present

## 2018-05-27 DIAGNOSIS — R109 Unspecified abdominal pain: Secondary | ICD-10-CM | POA: Diagnosis not present

## 2018-05-27 DIAGNOSIS — K922 Gastrointestinal hemorrhage, unspecified: Secondary | ICD-10-CM | POA: Diagnosis not present

## 2018-05-27 DIAGNOSIS — K264 Chronic or unspecified duodenal ulcer with hemorrhage: Secondary | ICD-10-CM | POA: Diagnosis not present

## 2018-05-27 DIAGNOSIS — R1084 Generalized abdominal pain: Secondary | ICD-10-CM | POA: Diagnosis not present

## 2018-05-27 DIAGNOSIS — K251 Acute gastric ulcer with perforation: Secondary | ICD-10-CM | POA: Diagnosis not present

## 2018-05-27 DIAGNOSIS — E119 Type 2 diabetes mellitus without complications: Secondary | ICD-10-CM | POA: Diagnosis not present

## 2018-05-27 DIAGNOSIS — R7881 Bacteremia: Secondary | ICD-10-CM | POA: Diagnosis not present

## 2018-05-27 DIAGNOSIS — Z431 Encounter for attention to gastrostomy: Secondary | ICD-10-CM | POA: Diagnosis not present

## 2018-05-27 DIAGNOSIS — Z515 Encounter for palliative care: Secondary | ICD-10-CM | POA: Diagnosis not present

## 2018-05-27 DIAGNOSIS — Z8673 Personal history of transient ischemic attack (TIA), and cerebral infarction without residual deficits: Secondary | ICD-10-CM | POA: Diagnosis not present

## 2018-05-27 DIAGNOSIS — E1369 Other specified diabetes mellitus with other specified complication: Secondary | ICD-10-CM | POA: Diagnosis not present

## 2018-05-27 DIAGNOSIS — R569 Unspecified convulsions: Secondary | ICD-10-CM | POA: Diagnosis not present

## 2018-05-27 DIAGNOSIS — R279 Unspecified lack of coordination: Secondary | ICD-10-CM | POA: Diagnosis not present

## 2018-05-27 DIAGNOSIS — K269 Duodenal ulcer, unspecified as acute or chronic, without hemorrhage or perforation: Secondary | ICD-10-CM | POA: Diagnosis not present

## 2018-05-30 DIAGNOSIS — E785 Hyperlipidemia, unspecified: Secondary | ICD-10-CM | POA: Diagnosis not present

## 2018-05-30 DIAGNOSIS — E1369 Other specified diabetes mellitus with other specified complication: Secondary | ICD-10-CM | POA: Diagnosis not present

## 2018-05-30 DIAGNOSIS — Z931 Gastrostomy status: Secondary | ICD-10-CM | POA: Diagnosis not present

## 2018-05-30 DIAGNOSIS — I4891 Unspecified atrial fibrillation: Secondary | ICD-10-CM | POA: Diagnosis not present

## 2018-05-31 ENCOUNTER — Other Ambulatory Visit: Payer: Self-pay

## 2018-05-31 ENCOUNTER — Emergency Department
Admission: EM | Admit: 2018-05-31 | Discharge: 2018-05-31 | Disposition: A | Discharge status: Other Acute Inpatient Hospital | Payer: Medicare HMO | Attending: Emergency Medicine | Admitting: Emergency Medicine

## 2018-05-31 ENCOUNTER — Emergency Department: Payer: Medicare HMO

## 2018-05-31 ENCOUNTER — Ambulatory Visit (HOSPITAL_COMMUNITY)
Admission: AD | Admit: 2018-05-31 | Discharge: 2018-05-31 | Disposition: A | Discharge status: Home / Self Care | Payer: Medicare HMO | Source: Other Acute Inpatient Hospital | Attending: Emergency Medicine | Admitting: Emergency Medicine

## 2018-05-31 ENCOUNTER — Encounter: Payer: Self-pay | Admitting: Emergency Medicine

## 2018-05-31 DIAGNOSIS — R41841 Cognitive communication deficit: Secondary | ICD-10-CM | POA: Diagnosis not present

## 2018-05-31 DIAGNOSIS — K254 Chronic or unspecified gastric ulcer with hemorrhage: Secondary | ICD-10-CM | POA: Diagnosis not present

## 2018-05-31 DIAGNOSIS — I639 Cerebral infarction, unspecified: Secondary | ICD-10-CM | POA: Diagnosis present

## 2018-05-31 DIAGNOSIS — I69391 Dysphagia following cerebral infarction: Secondary | ICD-10-CM | POA: Diagnosis not present

## 2018-05-31 DIAGNOSIS — G9341 Metabolic encephalopathy: Secondary | ICD-10-CM | POA: Diagnosis not present

## 2018-05-31 DIAGNOSIS — K9420 Gastrostomy complication, unspecified: Secondary | ICD-10-CM

## 2018-05-31 DIAGNOSIS — E1140 Type 2 diabetes mellitus with diabetic neuropathy, unspecified: Secondary | ICD-10-CM | POA: Diagnosis not present

## 2018-05-31 DIAGNOSIS — K251 Acute gastric ulcer with perforation: Secondary | ICD-10-CM | POA: Diagnosis not present

## 2018-05-31 DIAGNOSIS — K2980 Duodenitis without bleeding: Secondary | ICD-10-CM | POA: Diagnosis not present

## 2018-05-31 DIAGNOSIS — K668 Other specified disorders of peritoneum: Secondary | ICD-10-CM | POA: Diagnosis not present

## 2018-05-31 DIAGNOSIS — J45909 Unspecified asthma, uncomplicated: Secondary | ICD-10-CM | POA: Insufficient documentation

## 2018-05-31 DIAGNOSIS — K219 Gastro-esophageal reflux disease without esophagitis: Secondary | ICD-10-CM | POA: Diagnosis not present

## 2018-05-31 DIAGNOSIS — R0602 Shortness of breath: Secondary | ICD-10-CM | POA: Diagnosis not present

## 2018-05-31 DIAGNOSIS — Z7984 Long term (current) use of oral hypoglycemic drugs: Secondary | ICD-10-CM | POA: Diagnosis not present

## 2018-05-31 DIAGNOSIS — L89819 Pressure ulcer of head, unspecified stage: Secondary | ICD-10-CM | POA: Diagnosis not present

## 2018-05-31 DIAGNOSIS — Z8673 Personal history of transient ischemic attack (TIA), and cerebral infarction without residual deficits: Secondary | ICD-10-CM | POA: Insufficient documentation

## 2018-05-31 DIAGNOSIS — I10 Essential (primary) hypertension: Secondary | ICD-10-CM | POA: Insufficient documentation

## 2018-05-31 DIAGNOSIS — Z79899 Other long term (current) drug therapy: Secondary | ICD-10-CM | POA: Insufficient documentation

## 2018-05-31 DIAGNOSIS — K9429 Other complications of gastrostomy: Secondary | ICD-10-CM | POA: Diagnosis not present

## 2018-05-31 DIAGNOSIS — K2981 Duodenitis with bleeding: Secondary | ICD-10-CM | POA: Diagnosis not present

## 2018-05-31 DIAGNOSIS — N401 Enlarged prostate with lower urinary tract symptoms: Secondary | ICD-10-CM | POA: Diagnosis not present

## 2018-05-31 DIAGNOSIS — Z7982 Long term (current) use of aspirin: Secondary | ICD-10-CM | POA: Insufficient documentation

## 2018-05-31 DIAGNOSIS — R52 Pain, unspecified: Secondary | ICD-10-CM | POA: Diagnosis not present

## 2018-05-31 DIAGNOSIS — R5381 Other malaise: Secondary | ICD-10-CM | POA: Diagnosis not present

## 2018-05-31 DIAGNOSIS — M6281 Muscle weakness (generalized): Secondary | ICD-10-CM | POA: Diagnosis not present

## 2018-05-31 DIAGNOSIS — T8189XA Other complications of procedures, not elsewhere classified, initial encounter: Secondary | ICD-10-CM | POA: Diagnosis not present

## 2018-05-31 DIAGNOSIS — K269 Duodenal ulcer, unspecified as acute or chronic, without hemorrhage or perforation: Secondary | ICD-10-CM | POA: Diagnosis not present

## 2018-05-31 DIAGNOSIS — R1312 Dysphagia, oropharyngeal phase: Secondary | ICD-10-CM | POA: Diagnosis not present

## 2018-05-31 DIAGNOSIS — R1310 Dysphagia, unspecified: Secondary | ICD-10-CM | POA: Diagnosis not present

## 2018-05-31 DIAGNOSIS — R569 Unspecified convulsions: Secondary | ICD-10-CM | POA: Diagnosis not present

## 2018-05-31 DIAGNOSIS — E119 Type 2 diabetes mellitus without complications: Secondary | ICD-10-CM | POA: Diagnosis not present

## 2018-05-31 DIAGNOSIS — N179 Acute kidney failure, unspecified: Secondary | ICD-10-CM | POA: Diagnosis not present

## 2018-05-31 DIAGNOSIS — K264 Chronic or unspecified duodenal ulcer with hemorrhage: Secondary | ICD-10-CM | POA: Diagnosis not present

## 2018-05-31 DIAGNOSIS — D649 Anemia, unspecified: Secondary | ICD-10-CM | POA: Diagnosis not present

## 2018-05-31 DIAGNOSIS — Z515 Encounter for palliative care: Secondary | ICD-10-CM | POA: Diagnosis not present

## 2018-05-31 DIAGNOSIS — R279 Unspecified lack of coordination: Secondary | ICD-10-CM | POA: Diagnosis not present

## 2018-05-31 DIAGNOSIS — K9423 Gastrostomy malfunction: Secondary | ICD-10-CM | POA: Diagnosis not present

## 2018-05-31 DIAGNOSIS — Z431 Encounter for attention to gastrostomy: Secondary | ICD-10-CM | POA: Diagnosis present

## 2018-05-31 DIAGNOSIS — R1084 Generalized abdominal pain: Secondary | ICD-10-CM | POA: Diagnosis not present

## 2018-05-31 DIAGNOSIS — K922 Gastrointestinal hemorrhage, unspecified: Secondary | ICD-10-CM | POA: Diagnosis not present

## 2018-05-31 DIAGNOSIS — E785 Hyperlipidemia, unspecified: Secondary | ICD-10-CM | POA: Diagnosis not present

## 2018-05-31 DIAGNOSIS — T85848A Pain due to other internal prosthetic devices, implants and grafts, initial encounter: Secondary | ICD-10-CM | POA: Diagnosis not present

## 2018-05-31 HISTORY — DX: Metabolic encephalopathy: G93.41

## 2018-05-31 HISTORY — DX: Iron deficiency anemia, unspecified: D50.9

## 2018-05-31 HISTORY — DX: Gastro-esophageal reflux disease without esophagitis: K21.9

## 2018-05-31 HISTORY — DX: Dysphagia, unspecified: R13.10

## 2018-05-31 LAB — GLUCOSE, CAPILLARY: Glucose-Capillary: 103 mg/dL — ABNORMAL HIGH (ref 70–99)

## 2018-05-31 MED ORDER — SODIUM CHLORIDE (PF) 0.9 % IJ SOLN
10.0000 mL | INTRAMUSCULAR | Status: DC | PRN
  Administered 2018-05-31: 8 mL

## 2018-05-31 MED ORDER — PIPERACILLIN-TAZOBACTAM 3.375 G IVPB 30 MIN
3.3750 g | Freq: Once | INTRAVENOUS | Status: AC
  Administered 2018-05-31: 3.375 g via INTRAVENOUS
  Filled 2018-05-31: qty 50

## 2018-05-31 MED ORDER — LIDOCAINE VISCOUS HCL 2 % MT SOLN
OROMUCOSAL | Status: AC
  Administered 2018-05-31: 14:00:00
  Filled 2018-05-31: qty 15

## 2018-05-31 MED ORDER — IOPAMIDOL (ISOVUE-300) INJECTION 61%
25.0000 mL | Freq: Once | INTRAVENOUS | Status: AC | PRN
  Administered 2018-05-31: 25 mL

## 2018-05-31 MED ORDER — FENTANYL CITRATE (PF) 100 MCG/2ML IJ SOLN
50.0000 ug | Freq: Once | INTRAMUSCULAR | Status: AC
  Administered 2018-05-31: 50 ug via INTRAVENOUS
  Filled 2018-05-31: qty 2

## 2018-05-31 NOTE — ED Notes (Signed)
Pt transported to IR 

## 2018-05-31 NOTE — ED Notes (Signed)
Attempted to notify patient's daughter of impending transfer. No answer at this time. Will try again before transfer.

## 2018-05-31 NOTE — ED Notes (Signed)
Per Chip BoerVicki in [place], they were unable to get g-tube back in place. Dr. Shaune PollackLord aware. Patient back in room attached to monitor.

## 2018-05-31 NOTE — ED Provider Notes (Signed)
[facility] Emergency Department Provider Note ____________________________________________   I have reviewed the triage vital signs and the triage nursing note.  HISTORY  Chief Complaint g-tube replacement.   Historian Level 5 Caveat History Limited by poor historian   HPI Shane Sloan is a 66 y.o. male presenting from nursing home where he was found this morning to have G-tube dislodged.   I was able to get a hold of the daughter by phone who states that was placed last week and I was able to find record of that in the [facility] care everywhere note.  Sounds like it was placed secondary to aspiration risk.  No reported abdominal pain.  No trouble breathing.  By note this was placed by interventional radiology at [facility].    Past Medical History:  Diagnosis Date  . Allergy   . Asthma   . Diabetes mellitus without complication (HCC)   . Dysphagia   . GERD (gastroesophageal reflux disease)   . H/O blood clots   . Hyperlipidemia   . Hypertension   . Iron deficiency anemia   . Metabolic encephalopathy   . Prostate disease   . Stroke (HCC) 05/31/2013  . Urinary incontinence     Patient Active Problem List   Diagnosis Date Noted  . Primary osteoarthritis involving multiple joints 12/03/2017  . Cognitive deficit due to old cerebrovascular accident (CVA) 11/30/2016  . Polyneuropathy due to medical condition (HCC) 11/30/2016  . Controlled type 2 diabetes mellitus with diabetic autonomic neuropathy, without long-term current use of insulin (HCC) 11/04/2016  . Hyperlipidemia 11/04/2016  . Hypertension 11/04/2016  . BPH associated with nocturia 11/04/2016  . H/O blood clots   . Asthma   . History of stroke in adulthood 05/31/2013    Past Surgical History:  Procedure Laterality Date  . GASTROSTOMY TUBE PLACEMENT      Prior to Admission medications   Medication Sig Start Date End Date Taking? Authorizing Provider  acetaminophen (TYLENOL) 160 MG/5ML  liquid Take 1,000 mg by mouth every 6 (six) hours as needed for fever.   Yes [provider]  aspirin 325 MG tablet Take 325 mg by mouth daily.   Yes [provider]  atorvastatin (LIPITOR) 20 MG tablet Take 1 tablet (20 mg total) by mouth daily. 05/09/18 08/07/18 Yes Galen Manila, NP  clotrimazole (LOTRIMIN) 1 % cream Apply 1 application topically 2 (two) times daily. 05/09/18  Yes Galen Manila, NP  collagenase (SANTYL) ointment Apply 250 g topically daily. Apply to chest and right knee every day shift for wound healing 05/28/18  Yes [provider]  finasteride (PROSCAR) 5 MG tablet Take 1 tablet (5 mg total) by mouth daily. 05/09/18  Yes Galen Manila, NP  metFORMIN (GLUCOPHAGE) 500 MG tablet Take 1 tablet (500 mg total) by mouth daily with breakfast. 05/09/18  Yes Galen Manila, NP  pantoprazole sodium (PROTONIX) 40 mg/20 mL PACK 40 mg by Gastric Tube route 2 (two) times daily. 05/27/18 06/26/18 Yes [provider]  phenol (CHLORASEPTIC) 1.4 % LIQD Use as directed 2 sprays in the mouth or throat as needed for throat irritation / pain.   Yes [provider]  pregabalin (LYRICA) 50 MG capsule Place 50 mg into feeding tube 2 (two) times daily. 05/27/18  Yes [provider]  Sennosides 8.6 MG CAPS 8.6 mg by Gastric Tube route every 2 (two) hours as needed. 05/27/18 06/26/18 Yes [provider]  tamsulosin (FLOMAX) 0.4 MG CAPS capsule Take 1  capsule (0.4 mg total) by mouth daily. At the same time each day 05/09/18  Yes Galen Manila, NP  traMADol (ULTRAM) 50 MG tablet Take 50 mg by mouth every 4 (four) hours as needed.   Yes [provider]  amLODipine (NORVASC) 5 MG tablet Take 1 tablet (5 mg total) by mouth daily. Patient not taking: Reported on 05/31/2018 05/09/18   Galen Manila, NP    Allergies  Allergen Reactions  . Lisinopril Hives and Swelling    No family history on  file.  Social History Social History   Tobacco Use  . Smoking status: Never Smoker  . Smokeless tobacco: Never Used  Substance Use Topics  . Alcohol use: No  . Drug use: No    Review of Systems  Limited as patient is poor historian No chest pain.  No trouble breathing.  No abdominal pain.  ____________________________________________   PHYSICAL EXAM:  VITAL SIGNS: ED Triage Vitals  Enc Vitals Group     BP 05/31/18 0943 104/73     Pulse Rate 05/31/18 0943 71     Resp 05/31/18 0943 13     Temp 05/31/18 0943 97.8 F (36.6 C)     Temp Source 05/31/18 0943 Oral     SpO2 05/31/18 0943 99 %     Weight 05/31/18 0936 154 lb 1.6 oz (69.9 kg)     Height --      Head Circumference --      Peak Flow --      Pain Score 05/31/18 0936 0     Pain Loc --      Pain Edu? --      Excl. in GC? --      Constitutional: Alert and poor historian.  HEENT      Head: Normocephalic and atraumatic.      Eyes: Conjunctivae are normal. Pupils equal and round.       Ears:         Nose: No congestion/rhinnorhea.      Mouth/Throat: Mucous membranes are moist.      Neck: No stridor. Cardiovascular/Chest: Normal rate, regular rhythm.  No murmurs, rubs, or gallops. Respiratory: Normal respiratory effort without tachypnea nor retractions. Breath sounds are clear and equal bilaterally. No wheezes/rales/rhonchi. Gastrointestinal: Soft. No distention, no guarding, no rebound. Nontender.  He does have 2 small plastic tacks about the location of the PEG tube hole. Genitourinary/rectal:Deferred Musculoskeletal: Nontender with normal range of motion in all extremities.  Neurologic: No obvious facial droop.  Patient is really not that talkative.  Does not appear to have a focal neurologic deficit.   Skin:  Skin is warm, dry and intact. No rash noted. Psychiatric: No Agitation   ____________________________________________  LABS (pertinent positives/negatives) I, Governor Rooks, MD the attending  physician have reviewed the labs noted below.  Labs Reviewed - No data to display  ____________________________________________    EKG I, Governor Rooks, MD, the attending physician have personally viewed and interpreted all ECGs.  None ____________________________________________  RADIOLOGY   None __________________________________________  PROCEDURES  Procedure(s) performed: None  Procedures  Critical Care performed: None   ____________________________________________  ED COURSE / ASSESSMENT AND PLAN  Pertinent labs & imaging results that were available during my care of the patient were reviewed by me and considered in my medical decision making (see chart for details).     I did gently try to pass a 14 [place] G-tube, without success.  I spoke with interventional radiology here at [facility]  who was able to attempt to dilate and replace the G-tube without success.  I [facility] with [facility], Dr. Roseanne RenoStewart, who did recommend [facility] the [facility] to [facility]-tube placed through the same hole in order to hopefully limit the risk of peritonitis.  She did recommend initiating antibiotics, Zosyn.  Awaiting medicine consultation for transfer acceptance.  Patient care transferred to Dr. Derrill KayGoodman at shift change 4 PM.  Patient is not having any dental pain clinically.  CONSULTATIONS:   Interventional radiologist here at [place].  [facility] at [facility].   Patient / Family / Caregiver informed of clinical course, medical decision-making process, and agree with plan.    ___________________________________________   FINAL CLINICAL IMPRESSION(S) / ED DIAGNOSES   Final diagnoses:  PEG (percutaneous endoscopic gastrostomy) adjustment/replacement/removal (HCC)      ___________________________________________         Note: This dictation was prepared with Dragon dictation. Any transcriptional errors that result from  this process are unintentional    Governor RooksLord, Yuritza Paulhus, MD 05/31/18 1614

## 2018-05-31 NOTE — Procedures (Signed)
Interventional Radiology Procedure Note  Procedure: Attempted replacement of gastrostomy tube under fluoroscopy  Complications: None  Estimated Blood Loss: < 10 mL  Findings: Initial placement of 5 Fr dilator through percutaneous tract and able to opacify some gastric rugae with contrast injection under fluoro. Tract dilated over wire and 18 Fr balloon retention gastrostomy tube advanced through tract. Unfortunately, contrast injection through tube shows that it does not lie within the stomach with contrast entering the LUQ and peritoneal cavity. Wire and dilators likely were dislodged from gastric lumen during tract dilatation.  Will be unable to replace gastrostomy. Consider new tube placement at a later date. Would wait at least a few days to allow current tract to peritoneal cavity to heal. Attempt to place tube now would be of higher risk of complication and peritonitis.  Shane Sloan, M.D Pager:  [phone]

## 2018-05-31 NOTE — ED Triage Notes (Signed)
[facility].  EMS reports that patient pulled g-tube out this morning, here for replacement.  VS wnl.  NAD.

## 2018-05-31 NOTE — ED Notes (Signed)
[facility]  TRANSFER  [facility]  CALLED  PER  DR  LORD  MD

## 2018-05-31 NOTE — ED Notes (Signed)
Pt with 2 pressure bandage found on chest when this RN prepared pt for transport. Putriet smell from drainage coming out around bandage. Per prior to notes from care today, no assessment or removal of bandages found. This RN removed, cleaned and placed new dry dressing to wounds. Finding are: 2, approximate 9x9 cm wounds to the upper chest with black and red appearance. Yellow/green drainage.  [facility] and spoke with Gayleen Oremates, RN to update on findings due to possible infection.

## 2018-05-31 NOTE — ED Notes (Signed)
EMTALA reviewed by this RN.  

## 2018-05-31 NOTE — ED Notes (Signed)
[facility]   CALLED  FOR  TRANSPORT  TO  [facility]

## 2018-05-31 NOTE — ED Notes (Signed)
Spoke with Zella BallRobin at [facility] to inform them of patient's transfer.

## 2018-06-10 DIAGNOSIS — R339 Retention of urine, unspecified: Secondary | ICD-10-CM | POA: Diagnosis not present

## 2018-06-10 DIAGNOSIS — T85598A Other mechanical complication of other gastrointestinal prosthetic devices, implants and grafts, initial encounter: Secondary | ICD-10-CM | POA: Diagnosis not present

## 2018-06-10 DIAGNOSIS — Z960 Presence of urogenital implants: Secondary | ICD-10-CM | POA: Diagnosis not present

## 2018-06-10 DIAGNOSIS — R5381 Other malaise: Secondary | ICD-10-CM | POA: Diagnosis not present

## 2018-06-10 DIAGNOSIS — N401 Enlarged prostate with lower urinary tract symptoms: Secondary | ICD-10-CM | POA: Diagnosis not present

## 2018-06-10 DIAGNOSIS — Y838 Other surgical procedures as the cause of abnormal reaction of the patient, or of later complication, without mention of misadventure at the time of the procedure: Secondary | ICD-10-CM | POA: Diagnosis present

## 2018-06-10 DIAGNOSIS — R112 Nausea with vomiting, unspecified: Secondary | ICD-10-CM | POA: Diagnosis not present

## 2018-06-10 DIAGNOSIS — J9801 Acute bronchospasm: Secondary | ICD-10-CM | POA: Diagnosis not present

## 2018-06-10 DIAGNOSIS — R0902 Hypoxemia: Secondary | ICD-10-CM | POA: Diagnosis not present

## 2018-06-10 DIAGNOSIS — T839XXA Unspecified complication of genitourinary prosthetic device, implant and graft, initial encounter: Secondary | ICD-10-CM | POA: Diagnosis not present

## 2018-06-10 DIAGNOSIS — J690 Pneumonitis due to inhalation of food and vomit: Secondary | ICD-10-CM | POA: Diagnosis not present

## 2018-06-10 DIAGNOSIS — G9341 Metabolic encephalopathy: Secondary | ICD-10-CM | POA: Diagnosis not present

## 2018-06-10 DIAGNOSIS — T17300A Unspecified foreign body in larynx causing asphyxiation, initial encounter: Secondary | ICD-10-CM | POA: Diagnosis not present

## 2018-06-10 DIAGNOSIS — Z7189 Other specified counseling: Secondary | ICD-10-CM | POA: Diagnosis not present

## 2018-06-10 DIAGNOSIS — T17920A Food in respiratory tract, part unspecified causing asphyxiation, initial encounter: Secondary | ICD-10-CM | POA: Diagnosis not present

## 2018-06-10 DIAGNOSIS — D509 Iron deficiency anemia, unspecified: Secondary | ICD-10-CM | POA: Diagnosis present

## 2018-06-10 DIAGNOSIS — N179 Acute kidney failure, unspecified: Secondary | ICD-10-CM | POA: Diagnosis not present

## 2018-06-10 DIAGNOSIS — Z8249 Family history of ischemic heart disease and other diseases of the circulatory system: Secondary | ICD-10-CM | POA: Diagnosis not present

## 2018-06-10 DIAGNOSIS — R404 Transient alteration of awareness: Secondary | ICD-10-CM | POA: Diagnosis not present

## 2018-06-10 DIAGNOSIS — I69351 Hemiplegia and hemiparesis following cerebral infarction affecting right dominant side: Secondary | ICD-10-CM | POA: Diagnosis not present

## 2018-06-10 DIAGNOSIS — K279 Peptic ulcer, site unspecified, unspecified as acute or chronic, without hemorrhage or perforation: Secondary | ICD-10-CM | POA: Diagnosis not present

## 2018-06-10 DIAGNOSIS — R0989 Other specified symptoms and signs involving the circulatory and respiratory systems: Secondary | ICD-10-CM | POA: Diagnosis not present

## 2018-06-10 DIAGNOSIS — Z79899 Other long term (current) drug therapy: Secondary | ICD-10-CM | POA: Diagnosis not present

## 2018-06-10 DIAGNOSIS — I4891 Unspecified atrial fibrillation: Secondary | ICD-10-CM | POA: Diagnosis not present

## 2018-06-10 DIAGNOSIS — R0689 Other abnormalities of breathing: Secondary | ICD-10-CM | POA: Diagnosis not present

## 2018-06-10 DIAGNOSIS — K311 Adult hypertrophic pyloric stenosis: Secondary | ICD-10-CM | POA: Diagnosis not present

## 2018-06-10 DIAGNOSIS — I10 Essential (primary) hypertension: Secondary | ICD-10-CM | POA: Diagnosis not present

## 2018-06-10 DIAGNOSIS — E119 Type 2 diabetes mellitus without complications: Secondary | ICD-10-CM | POA: Diagnosis present

## 2018-06-10 DIAGNOSIS — Z931 Gastrostomy status: Secondary | ICD-10-CM | POA: Diagnosis not present

## 2018-06-10 DIAGNOSIS — J302 Other seasonal allergic rhinitis: Secondary | ICD-10-CM | POA: Diagnosis present

## 2018-06-10 DIAGNOSIS — Z888 Allergy status to other drugs, medicaments and biological substances status: Secondary | ICD-10-CM | POA: Diagnosis not present

## 2018-06-10 DIAGNOSIS — Z431 Encounter for attention to gastrostomy: Secondary | ICD-10-CM | POA: Diagnosis not present

## 2018-06-10 DIAGNOSIS — E1140 Type 2 diabetes mellitus with diabetic neuropathy, unspecified: Secondary | ICD-10-CM | POA: Diagnosis not present

## 2018-06-10 DIAGNOSIS — J9601 Acute respiratory failure with hypoxia: Secondary | ICD-10-CM | POA: Diagnosis not present

## 2018-06-10 DIAGNOSIS — E1369 Other specified diabetes mellitus with other specified complication: Secondary | ICD-10-CM | POA: Diagnosis not present

## 2018-06-10 DIAGNOSIS — R41841 Cognitive communication deficit: Secondary | ICD-10-CM | POA: Diagnosis not present

## 2018-06-10 DIAGNOSIS — R569 Unspecified convulsions: Secondary | ICD-10-CM | POA: Diagnosis not present

## 2018-06-10 DIAGNOSIS — J189 Pneumonia, unspecified organism: Secondary | ICD-10-CM | POA: Diagnosis not present

## 2018-06-10 DIAGNOSIS — K219 Gastro-esophageal reflux disease without esophagitis: Secondary | ICD-10-CM | POA: Diagnosis not present

## 2018-06-10 DIAGNOSIS — Z4682 Encounter for fitting and adjustment of non-vascular catheter: Secondary | ICD-10-CM | POA: Diagnosis not present

## 2018-06-10 DIAGNOSIS — K269 Duodenal ulcer, unspecified as acute or chronic, without hemorrhage or perforation: Secondary | ICD-10-CM | POA: Diagnosis not present

## 2018-06-10 DIAGNOSIS — R1310 Dysphagia, unspecified: Secondary | ICD-10-CM | POA: Diagnosis not present

## 2018-06-10 DIAGNOSIS — E785 Hyperlipidemia, unspecified: Secondary | ICD-10-CM | POA: Diagnosis not present

## 2018-06-10 DIAGNOSIS — R1312 Dysphagia, oropharyngeal phase: Secondary | ICD-10-CM | POA: Diagnosis not present

## 2018-06-10 DIAGNOSIS — R05 Cough: Secondary | ICD-10-CM | POA: Diagnosis not present

## 2018-06-10 DIAGNOSIS — K922 Gastrointestinal hemorrhage, unspecified: Secondary | ICD-10-CM | POA: Diagnosis not present

## 2018-06-10 DIAGNOSIS — Z515 Encounter for palliative care: Secondary | ICD-10-CM | POA: Diagnosis not present

## 2018-06-10 DIAGNOSIS — K2980 Duodenitis without bleeding: Secondary | ICD-10-CM | POA: Diagnosis not present

## 2018-06-10 DIAGNOSIS — K264 Chronic or unspecified duodenal ulcer with hemorrhage: Secondary | ICD-10-CM | POA: Diagnosis not present

## 2018-06-10 DIAGNOSIS — L89320 Pressure ulcer of left buttock, unstageable: Secondary | ICD-10-CM | POA: Diagnosis present

## 2018-06-10 DIAGNOSIS — J9811 Atelectasis: Secondary | ICD-10-CM | POA: Diagnosis not present

## 2018-06-10 DIAGNOSIS — R110 Nausea: Secondary | ICD-10-CM | POA: Diagnosis not present

## 2018-06-10 DIAGNOSIS — R918 Other nonspecific abnormal finding of lung field: Secondary | ICD-10-CM | POA: Diagnosis not present

## 2018-06-10 DIAGNOSIS — M6281 Muscle weakness (generalized): Secondary | ICD-10-CM | POA: Diagnosis not present

## 2018-06-10 DIAGNOSIS — E876 Hypokalemia: Secondary | ICD-10-CM | POA: Diagnosis present

## 2018-06-10 DIAGNOSIS — E041 Nontoxic single thyroid nodule: Secondary | ICD-10-CM | POA: Diagnosis not present

## 2018-06-10 DIAGNOSIS — Z66 Do not resuscitate: Secondary | ICD-10-CM | POA: Diagnosis present

## 2018-06-10 DIAGNOSIS — K920 Hematemesis: Secondary | ICD-10-CM | POA: Diagnosis not present

## 2018-06-10 DIAGNOSIS — R279 Unspecified lack of coordination: Secondary | ICD-10-CM | POA: Diagnosis not present

## 2018-06-10 DIAGNOSIS — I69320 Aphasia following cerebral infarction: Secondary | ICD-10-CM | POA: Diagnosis not present

## 2018-06-10 DIAGNOSIS — R52 Pain, unspecified: Secondary | ICD-10-CM | POA: Diagnosis not present

## 2018-06-10 DIAGNOSIS — T17908A Unspecified foreign body in respiratory tract, part unspecified causing other injury, initial encounter: Secondary | ICD-10-CM | POA: Diagnosis not present

## 2018-06-10 DIAGNOSIS — I69391 Dysphagia following cerebral infarction: Secondary | ICD-10-CM | POA: Diagnosis not present

## 2018-06-14 ENCOUNTER — Telehealth: Payer: Self-pay | Admitting: Nurse Practitioner

## 2018-06-14 DIAGNOSIS — E785 Hyperlipidemia, unspecified: Secondary | ICD-10-CM | POA: Diagnosis not present

## 2018-06-14 DIAGNOSIS — I4891 Unspecified atrial fibrillation: Secondary | ICD-10-CM | POA: Diagnosis not present

## 2018-06-14 DIAGNOSIS — E1369 Other specified diabetes mellitus with other specified complication: Secondary | ICD-10-CM | POA: Diagnosis not present

## 2018-06-14 DIAGNOSIS — E1140 Type 2 diabetes mellitus with diabetic neuropathy, unspecified: Secondary | ICD-10-CM | POA: Diagnosis not present

## 2018-06-14 NOTE — Telephone Encounter (Signed)
Called to schedule Medicare Annual Wellness Visit with the Nurse Health Advisor.  ° °If patient returns call, please schedule AWV with NHA any date  ° °Thank you! °For any questions please contact: Kathryn Brown [phone] or Skype at: [email or web address]  ° ° °

## 2018-06-17 DIAGNOSIS — T17908A Unspecified foreign body in respiratory tract, part unspecified causing other injury, initial encounter: Secondary | ICD-10-CM | POA: Diagnosis not present

## 2018-06-17 DIAGNOSIS — R1312 Dysphagia, oropharyngeal phase: Secondary | ICD-10-CM | POA: Diagnosis not present

## 2018-06-17 DIAGNOSIS — Z931 Gastrostomy status: Secondary | ICD-10-CM | POA: Diagnosis not present

## 2018-06-17 DIAGNOSIS — R0689 Other abnormalities of breathing: Secondary | ICD-10-CM | POA: Diagnosis not present

## 2018-06-30 ENCOUNTER — Other Ambulatory Visit: Payer: Self-pay | Admitting: Nurse Practitioner

## 2018-06-30 DIAGNOSIS — E1143 Type 2 diabetes mellitus with diabetic autonomic (poly)neuropathy: Secondary | ICD-10-CM

## 2018-07-08 DIAGNOSIS — T839XXA Unspecified complication of genitourinary prosthetic device, implant and graft, initial encounter: Secondary | ICD-10-CM | POA: Diagnosis not present

## 2018-07-08 DIAGNOSIS — R339 Retention of urine, unspecified: Secondary | ICD-10-CM | POA: Diagnosis not present

## 2018-07-08 DIAGNOSIS — Z960 Presence of urogenital implants: Secondary | ICD-10-CM | POA: Diagnosis not present

## 2018-07-18 ENCOUNTER — Other Ambulatory Visit: Payer: Self-pay | Admitting: Physician Assistant

## 2018-07-18 DIAGNOSIS — T17928A Food in respiratory tract, part unspecified causing other injury, initial encounter: Secondary | ICD-10-CM

## 2018-07-18 DIAGNOSIS — T17920A Food in respiratory tract, part unspecified causing asphyxiation, initial encounter: Secondary | ICD-10-CM

## 2018-07-26 ENCOUNTER — Ambulatory Visit
Admission: RE | Admit: 2018-07-26 | Discharge: 2018-07-26 | Disposition: A | Discharge status: Home / Self Care | Payer: Medicare HMO | Source: Ambulatory Visit | Attending: Physician Assistant | Admitting: Physician Assistant

## 2018-07-26 DIAGNOSIS — R1312 Dysphagia, oropharyngeal phase: Secondary | ICD-10-CM | POA: Insufficient documentation

## 2018-07-26 DIAGNOSIS — T17300A Unspecified foreign body in larynx causing asphyxiation, initial encounter: Secondary | ICD-10-CM | POA: Diagnosis not present

## 2018-07-26 DIAGNOSIS — T17920A Food in respiratory tract, part unspecified causing asphyxiation, initial encounter: Secondary | ICD-10-CM | POA: Insufficient documentation

## 2018-07-26 DIAGNOSIS — K219 Gastro-esophageal reflux disease without esophagitis: Secondary | ICD-10-CM | POA: Diagnosis not present

## 2018-07-26 DIAGNOSIS — R1310 Dysphagia, unspecified: Secondary | ICD-10-CM | POA: Diagnosis not present

## 2018-07-26 DIAGNOSIS — T17928A Food in respiratory tract, part unspecified causing other injury, initial encounter: Secondary | ICD-10-CM

## 2018-07-26 NOTE — Therapy (Signed)
[facility] DIAGNOSTIC RADIOLOGY [address] [place], [place], [address] Phone: [phone]   Fax:     Modified Barium Swallow  Patient Details  Name: Shane Sloan MRN: [id number] Date of Birth: 1950-10-05 No data recorded  Encounter Date: 07/26/2018  End of Session - 07/26/18 1537    Visit Number  1    Number of Visits  1    Date for SLP Re-Evaluation  07/26/18    SLP Start Time  1400    SLP Stop Time   1455    SLP Time Calculation (min)  55 min    Activity Tolerance  Patient tolerated treatment well       Past Medical History:  Diagnosis Date  . Allergy   . Asthma   . Diabetes mellitus without complication (HCC)   . Dysphagia   . GERD (gastroesophageal reflux disease)   . H/O blood clots   . Hyperlipidemia   . Hypertension   . Iron deficiency anemia   . Metabolic encephalopathy   . Prostate disease   . Stroke (HCC) 05/31/2013  . Urinary incontinence     Past Surgical History:  Procedure Laterality Date  . GASTROSTOMY TUBE PLACEMENT      There were no vitals filed for this visit.   Subjective: Patient behavior: (alertness, ability to follow instructions, etc.):  The patient is alert and oriented to swallowing.  He is able to follow simple commands  Chief complaint: Patient is NPO with feeding tube.  Patient's last MBS was 05/24/2018.    Objective:  Radiological Procedure: A videoflouroscopic evaluation of oral-preparatory, reflex initiation, and pharyngeal phases of the swallow was performed; as well as a screening of the upper esophageal phase.  I. POSTURE: Upright in MBS chair  II. VIEW: Lateral  III. COMPENSATORY STRATEGIES: Chin tuck- not helpful in preventing aspiration  IV. BOLUSES ADMINISTERED:   Thin Liquid: DNT   Nectar-thick Liquid: 2 teaspoon presentations   Honey-thick Liquid: DNT   Puree: 1 teaspoon presentation   Mechanical Soft: DNT  V. RESULTS OF EVALUATION: A. ORAL PREPARATORY PHASE:  (The lips, tongue, and velum are observed for strength and coordination)       **Overall Severity Rating: Moderate; reduced coordination and bolus control   B. SWALLOW INITIATION/REFLEX: (The reflex is normal if "triggered" by the time the bolus reached the base of the tongue)  **Overall Severity Rating: Moderate-severe; triggers at the pyriform sinuses  C. PHARYNGEAL PHASE: (Pharyngeal function is normal if the bolus shows rapid, smooth, and continuous transit through the pharynx and there is no pharyngeal residue after the swallow)  **Overall Severity Rating: Moderate; reduced base of tongue retraction, pharyngeal constriction, hyolaryngeal elevation and epiglottic inversion as well as reduced laryngeal closure with liquids resulting in aspiration of all liquids and penetration of pureed solids. Moderate post-swallow residual noted across consistencies.  D. LARYNGEAL PENETRATION: (Material entering into the laryngeal inlet/vestibule but not aspirated) Pureed solids  E. ASPIRATION: Nectar-thick liquids- silent  F. ESOPHAGEAL PHASE: (Screening of the upper esophagus) no abnormality within the viewable cervical esophagus  ASSESSMENT: The patient presents with minimal progress from most recent MBSS on 11/19.  He continues to present with moderate oropharyngeal dysphagia with oral phase characterized by reduced coordination and bolus control and pharyngeal phase characterized by reduced base of tongue retraction, pharyngeal constriction, hyolaryngeal elevation and epiglottic inversion as well as reduced laryngeal closure w/ liquids resulting in aspiration of all liquids and penetration of pureed solids. Moderate post-swallow residual noted  across consistencies. Residual in pyriform sinuses contributed significantly to eventual aspiration. Reduced velopharyngeal closure also noted with backflow noted into the nasopharynx. The patient demonstrated silent aspiration. Chin tuck did not prevent aspiration and  appeared to make aspiration of pyriform sinus residue worse.  Cued cough was weak and also ineffective. Spontaneous, multiple swallows reduced but did not clear pharyngeal residue. Recommend continue NPO status.  Continue to recommend:   Allow ice chips/sips of water, after oral care, as tolerated. SLP to continue to provide therapeutic intervention including therapeutic boluses of pureed solids (with SLP only) and pharyngeal/laryngeal strengthening exercises as able.  PLAN/RECOMMENDATIONS:   A. Diet: NPO   B. Swallowing Precautions: Stringent oral care   C. Recommended consultation to: N/A   D. Therapy recommendations: continue ST for pharyngeal/laryngeal strengthening exercises as able and therapeutic boluses (SLP only)   E. Results and recommendations were discussed with the patient immediately following the study and the final report routed to the referring MD and treating SLP.   Patient will benefit from skilled therapeutic intervention in order to improve the following deficits and impairments:   Dysphagia, oropharyngeal phase  Aspiration of food, initial encounter - Plan: DG SWALLOW FUNC W VID CINE [place] NECK DELAYED IMAGE WITH BA MEDICARE, DG SWALLOW FUNC W VID CINE  NECK DELAYED IMAGE WITH BA MEDICARE        Problem List Patient Active Problem List   Diagnosis Date Noted  . Primary osteoarthritis involving multiple joints 12/03/2017  . Cognitive deficit due to old cerebrovascular accident (CVA) 11/30/2016  . Polyneuropathy due to medical condition (HCC) 11/30/2016  . Controlled type 2 diabetes mellitus with diabetic autonomic neuropathy, without long-term current use of insulin (HCC) 11/04/2016  . Hyperlipidemia 11/04/2016  . Hypertension 11/04/2016  . BPH associated with nocturia 11/04/2016  . H/O blood clots   . Asthma   . History of stroke in adulthood 05/31/2013   Dollene Primrose, MS/CCC- SLP  Leandrew Koyanagi 07/26/2018, 3:38 PM  [facility] DIAGNOSTIC RADIOLOGY [address] [place], [place], [address] Phone: [phone]   Fax:     Name: Freman Leonor MRN: [id number] Date of Birth: [birth year]

## 2018-08-06 DIAGNOSIS — E876 Hypokalemia: Secondary | ICD-10-CM | POA: Diagnosis present

## 2018-08-06 DIAGNOSIS — D500 Iron deficiency anemia secondary to blood loss (chronic): Secondary | ICD-10-CM | POA: Diagnosis not present

## 2018-08-06 DIAGNOSIS — Y838 Other surgical procedures as the cause of abnormal reaction of the patient, or of later complication, without mention of misadventure at the time of the procedure: Secondary | ICD-10-CM | POA: Diagnosis present

## 2018-08-06 DIAGNOSIS — L89320 Pressure ulcer of left buttock, unstageable: Secondary | ICD-10-CM | POA: Diagnosis present

## 2018-08-06 DIAGNOSIS — I69391 Dysphagia following cerebral infarction: Secondary | ICD-10-CM | POA: Diagnosis not present

## 2018-08-06 DIAGNOSIS — M6281 Muscle weakness (generalized): Secondary | ICD-10-CM | POA: Diagnosis not present

## 2018-08-06 DIAGNOSIS — K269 Duodenal ulcer, unspecified as acute or chronic, without hemorrhage or perforation: Secondary | ICD-10-CM | POA: Diagnosis not present

## 2018-08-06 DIAGNOSIS — I69320 Aphasia following cerebral infarction: Secondary | ICD-10-CM | POA: Diagnosis not present

## 2018-08-06 DIAGNOSIS — B964 Proteus (mirabilis) (morganii) as the cause of diseases classified elsewhere: Secondary | ICD-10-CM | POA: Diagnosis not present

## 2018-08-06 DIAGNOSIS — R339 Retention of urine, unspecified: Secondary | ICD-10-CM | POA: Diagnosis not present

## 2018-08-06 DIAGNOSIS — G9341 Metabolic encephalopathy: Secondary | ICD-10-CM | POA: Diagnosis not present

## 2018-08-06 DIAGNOSIS — R1084 Generalized abdominal pain: Secondary | ICD-10-CM | POA: Diagnosis not present

## 2018-08-06 DIAGNOSIS — R1312 Dysphagia, oropharyngeal phase: Secondary | ICD-10-CM | POA: Diagnosis not present

## 2018-08-06 DIAGNOSIS — J690 Pneumonitis due to inhalation of food and vomit: Secondary | ICD-10-CM | POA: Diagnosis not present

## 2018-08-06 DIAGNOSIS — I4891 Unspecified atrial fibrillation: Secondary | ICD-10-CM | POA: Diagnosis not present

## 2018-08-06 DIAGNOSIS — R0902 Hypoxemia: Secondary | ICD-10-CM | POA: Diagnosis not present

## 2018-08-06 DIAGNOSIS — E0840 Diabetes mellitus due to underlying condition with diabetic neuropathy, unspecified: Secondary | ICD-10-CM | POA: Diagnosis not present

## 2018-08-06 DIAGNOSIS — K264 Chronic or unspecified duodenal ulcer with hemorrhage: Secondary | ICD-10-CM | POA: Diagnosis not present

## 2018-08-06 DIAGNOSIS — N401 Enlarged prostate with lower urinary tract symptoms: Secondary | ICD-10-CM | POA: Diagnosis not present

## 2018-08-06 DIAGNOSIS — R58 Hemorrhage, not elsewhere classified: Secondary | ICD-10-CM | POA: Diagnosis not present

## 2018-08-06 DIAGNOSIS — J9811 Atelectasis: Secondary | ICD-10-CM | POA: Diagnosis not present

## 2018-08-06 DIAGNOSIS — Z9181 History of falling: Secondary | ICD-10-CM | POA: Diagnosis not present

## 2018-08-06 DIAGNOSIS — R69 Illness, unspecified: Secondary | ICD-10-CM | POA: Diagnosis not present

## 2018-08-06 DIAGNOSIS — J189 Pneumonia, unspecified organism: Secondary | ICD-10-CM | POA: Diagnosis not present

## 2018-08-06 DIAGNOSIS — K311 Adult hypertrophic pyloric stenosis: Secondary | ICD-10-CM | POA: Diagnosis not present

## 2018-08-06 DIAGNOSIS — R41841 Cognitive communication deficit: Secondary | ICD-10-CM | POA: Diagnosis not present

## 2018-08-06 DIAGNOSIS — K922 Gastrointestinal hemorrhage, unspecified: Secondary | ICD-10-CM | POA: Diagnosis not present

## 2018-08-06 DIAGNOSIS — Z4682 Encounter for fitting and adjustment of non-vascular catheter: Secondary | ICD-10-CM | POA: Diagnosis not present

## 2018-08-06 DIAGNOSIS — J302 Other seasonal allergic rhinitis: Secondary | ICD-10-CM | POA: Diagnosis present

## 2018-08-06 DIAGNOSIS — N179 Acute kidney failure, unspecified: Secondary | ICD-10-CM | POA: Diagnosis not present

## 2018-08-06 DIAGNOSIS — I10 Essential (primary) hypertension: Secondary | ICD-10-CM | POA: Diagnosis not present

## 2018-08-06 DIAGNOSIS — J9801 Acute bronchospasm: Secondary | ICD-10-CM | POA: Diagnosis not present

## 2018-08-06 DIAGNOSIS — I69351 Hemiplegia and hemiparesis following cerebral infarction affecting right dominant side: Secondary | ICD-10-CM | POA: Diagnosis not present

## 2018-08-06 DIAGNOSIS — R918 Other nonspecific abnormal finding of lung field: Secondary | ICD-10-CM | POA: Diagnosis not present

## 2018-08-06 DIAGNOSIS — Z8249 Family history of ischemic heart disease and other diseases of the circulatory system: Secondary | ICD-10-CM | POA: Diagnosis not present

## 2018-08-06 DIAGNOSIS — K255 Chronic or unspecified gastric ulcer with perforation: Secondary | ICD-10-CM | POA: Diagnosis not present

## 2018-08-06 DIAGNOSIS — Z7189 Other specified counseling: Secondary | ICD-10-CM | POA: Diagnosis not present

## 2018-08-06 DIAGNOSIS — K2980 Duodenitis without bleeding: Secondary | ICD-10-CM | POA: Diagnosis not present

## 2018-08-06 DIAGNOSIS — I69322 Dysarthria following cerebral infarction: Secondary | ICD-10-CM | POA: Diagnosis not present

## 2018-08-06 DIAGNOSIS — E785 Hyperlipidemia, unspecified: Secondary | ICD-10-CM | POA: Diagnosis not present

## 2018-08-06 DIAGNOSIS — J9601 Acute respiratory failure with hypoxia: Secondary | ICD-10-CM | POA: Diagnosis not present

## 2018-08-06 DIAGNOSIS — Z960 Presence of urogenital implants: Secondary | ICD-10-CM | POA: Diagnosis not present

## 2018-08-06 DIAGNOSIS — R112 Nausea with vomiting, unspecified: Secondary | ICD-10-CM | POA: Diagnosis not present

## 2018-08-06 DIAGNOSIS — Z888 Allergy status to other drugs, medicaments and biological substances status: Secondary | ICD-10-CM | POA: Diagnosis not present

## 2018-08-06 DIAGNOSIS — E119 Type 2 diabetes mellitus without complications: Secondary | ICD-10-CM | POA: Diagnosis not present

## 2018-08-06 DIAGNOSIS — Z931 Gastrostomy status: Secondary | ICD-10-CM | POA: Diagnosis not present

## 2018-08-06 DIAGNOSIS — T85598A Other mechanical complication of other gastrointestinal prosthetic devices, implants and grafts, initial encounter: Secondary | ICD-10-CM | POA: Diagnosis not present

## 2018-08-06 DIAGNOSIS — K920 Hematemesis: Secondary | ICD-10-CM | POA: Diagnosis not present

## 2018-08-06 DIAGNOSIS — R569 Unspecified convulsions: Secondary | ICD-10-CM | POA: Diagnosis not present

## 2018-08-06 DIAGNOSIS — R404 Transient alteration of awareness: Secondary | ICD-10-CM | POA: Diagnosis not present

## 2018-08-06 DIAGNOSIS — Z515 Encounter for palliative care: Secondary | ICD-10-CM | POA: Diagnosis not present

## 2018-08-06 DIAGNOSIS — R110 Nausea: Secondary | ICD-10-CM | POA: Diagnosis not present

## 2018-08-06 DIAGNOSIS — I69328 Other speech and language deficits following cerebral infarction: Secondary | ICD-10-CM | POA: Diagnosis not present

## 2018-08-06 DIAGNOSIS — D509 Iron deficiency anemia, unspecified: Secondary | ICD-10-CM | POA: Diagnosis present

## 2018-08-06 DIAGNOSIS — K279 Peptic ulcer, site unspecified, unspecified as acute or chronic, without hemorrhage or perforation: Secondary | ICD-10-CM | POA: Diagnosis not present

## 2018-08-06 DIAGNOSIS — R1310 Dysphagia, unspecified: Secondary | ICD-10-CM | POA: Diagnosis present

## 2018-08-06 DIAGNOSIS — Z66 Do not resuscitate: Secondary | ICD-10-CM | POA: Diagnosis present

## 2018-08-06 DIAGNOSIS — K219 Gastro-esophageal reflux disease without esophagitis: Secondary | ICD-10-CM | POA: Diagnosis not present

## 2018-08-11 ENCOUNTER — Ambulatory Visit: Payer: Medicare HMO

## 2018-08-17 ENCOUNTER — Ambulatory Visit: Payer: Medicare HMO | Admitting: Nurse Practitioner

## 2018-08-19 ENCOUNTER — Ambulatory Visit: Payer: Medicare HMO | Admitting: Nurse Practitioner

## 2018-08-22 ENCOUNTER — Encounter: Payer: Self-pay | Admitting: Anesthesiology

## 2018-08-22 ENCOUNTER — Encounter
Admission: EM | Disposition: A | Discharge status: Skilled Nursing Facility | Payer: Self-pay | Source: Home / Self Care | Attending: Internal Medicine

## 2018-08-22 ENCOUNTER — Inpatient Hospital Stay
Admission: EM | Admit: 2018-08-22 | Discharge: 2018-08-30 | DRG: 326 | Disposition: A | Discharge status: Skilled Nursing Facility | Payer: Medicare HMO | Attending: Internal Medicine | Admitting: Internal Medicine

## 2018-08-22 ENCOUNTER — Other Ambulatory Visit: Payer: Self-pay

## 2018-08-22 ENCOUNTER — Inpatient Hospital Stay: Payer: Medicare HMO

## 2018-08-22 ENCOUNTER — Encounter: Payer: Self-pay | Admitting: Emergency Medicine

## 2018-08-22 ENCOUNTER — Emergency Department: Payer: Medicare HMO

## 2018-08-22 DIAGNOSIS — Z66 Do not resuscitate: Secondary | ICD-10-CM | POA: Diagnosis present

## 2018-08-22 DIAGNOSIS — K269 Duodenal ulcer, unspecified as acute or chronic, without hemorrhage or perforation: Secondary | ICD-10-CM

## 2018-08-22 DIAGNOSIS — Z931 Gastrostomy status: Secondary | ICD-10-CM | POA: Diagnosis not present

## 2018-08-22 DIAGNOSIS — R279 Unspecified lack of coordination: Secondary | ICD-10-CM | POA: Diagnosis not present

## 2018-08-22 DIAGNOSIS — T85598A Other mechanical complication of other gastrointestinal prosthetic devices, implants and grafts, initial encounter: Secondary | ICD-10-CM | POA: Diagnosis not present

## 2018-08-22 DIAGNOSIS — I69320 Aphasia following cerebral infarction: Secondary | ICD-10-CM | POA: Diagnosis not present

## 2018-08-22 DIAGNOSIS — R0902 Hypoxemia: Secondary | ICD-10-CM | POA: Diagnosis not present

## 2018-08-22 DIAGNOSIS — K279 Peptic ulcer, site unspecified, unspecified as acute or chronic, without hemorrhage or perforation: Secondary | ICD-10-CM | POA: Diagnosis not present

## 2018-08-22 DIAGNOSIS — K920 Hematemesis: Secondary | ICD-10-CM | POA: Diagnosis not present

## 2018-08-22 DIAGNOSIS — K311 Adult hypertrophic pyloric stenosis: Secondary | ICD-10-CM | POA: Diagnosis not present

## 2018-08-22 DIAGNOSIS — E119 Type 2 diabetes mellitus without complications: Secondary | ICD-10-CM | POA: Diagnosis not present

## 2018-08-22 DIAGNOSIS — Z7189 Other specified counseling: Secondary | ICD-10-CM | POA: Diagnosis not present

## 2018-08-22 DIAGNOSIS — K264 Chronic or unspecified duodenal ulcer with hemorrhage: Principal | ICD-10-CM | POA: Diagnosis present

## 2018-08-22 DIAGNOSIS — Z794 Long term (current) use of insulin: Secondary | ICD-10-CM

## 2018-08-22 DIAGNOSIS — Z978 Presence of other specified devices: Secondary | ICD-10-CM

## 2018-08-22 DIAGNOSIS — Z79899 Other long term (current) drug therapy: Secondary | ICD-10-CM

## 2018-08-22 DIAGNOSIS — I69351 Hemiplegia and hemiparesis following cerebral infarction affecting right dominant side: Secondary | ICD-10-CM | POA: Diagnosis not present

## 2018-08-22 DIAGNOSIS — Z888 Allergy status to other drugs, medicaments and biological substances status: Secondary | ICD-10-CM

## 2018-08-22 DIAGNOSIS — Z22322 Carrier or suspected carrier of Methicillin resistant Staphylococcus aureus: Secondary | ICD-10-CM

## 2018-08-22 DIAGNOSIS — K219 Gastro-esophageal reflux disease without esophagitis: Secondary | ICD-10-CM | POA: Diagnosis present

## 2018-08-22 DIAGNOSIS — I10 Essential (primary) hypertension: Secondary | ICD-10-CM | POA: Diagnosis present

## 2018-08-22 DIAGNOSIS — T17908A Unspecified foreign body in respiratory tract, part unspecified causing other injury, initial encounter: Secondary | ICD-10-CM

## 2018-08-22 DIAGNOSIS — E785 Hyperlipidemia, unspecified: Secondary | ICD-10-CM | POA: Diagnosis present

## 2018-08-22 DIAGNOSIS — Z515 Encounter for palliative care: Secondary | ICD-10-CM

## 2018-08-22 DIAGNOSIS — R1310 Dysphagia, unspecified: Secondary | ICD-10-CM | POA: Diagnosis present

## 2018-08-22 DIAGNOSIS — Z960 Presence of urogenital implants: Secondary | ICD-10-CM | POA: Diagnosis not present

## 2018-08-22 DIAGNOSIS — Z743 Need for continuous supervision: Secondary | ICD-10-CM | POA: Diagnosis not present

## 2018-08-22 DIAGNOSIS — Z8249 Family history of ischemic heart disease and other diseases of the circulatory system: Secondary | ICD-10-CM

## 2018-08-22 DIAGNOSIS — J189 Pneumonia, unspecified organism: Secondary | ICD-10-CM | POA: Diagnosis not present

## 2018-08-22 DIAGNOSIS — I69391 Dysphagia following cerebral infarction: Secondary | ICD-10-CM | POA: Diagnosis not present

## 2018-08-22 DIAGNOSIS — N401 Enlarged prostate with lower urinary tract symptoms: Secondary | ICD-10-CM | POA: Diagnosis present

## 2018-08-22 DIAGNOSIS — K922 Gastrointestinal hemorrhage, unspecified: Secondary | ICD-10-CM | POA: Diagnosis present

## 2018-08-22 DIAGNOSIS — J9801 Acute bronchospasm: Secondary | ICD-10-CM | POA: Diagnosis present

## 2018-08-22 DIAGNOSIS — J9690 Respiratory failure, unspecified, unspecified whether with hypoxia or hypercapnia: Secondary | ICD-10-CM | POA: Diagnosis not present

## 2018-08-22 DIAGNOSIS — R918 Other nonspecific abnormal finding of lung field: Secondary | ICD-10-CM | POA: Diagnosis not present

## 2018-08-22 DIAGNOSIS — D509 Iron deficiency anemia, unspecified: Secondary | ICD-10-CM | POA: Diagnosis present

## 2018-08-22 DIAGNOSIS — J9601 Acute respiratory failure with hypoxia: Secondary | ICD-10-CM | POA: Diagnosis not present

## 2018-08-22 DIAGNOSIS — J302 Other seasonal allergic rhinitis: Secondary | ICD-10-CM | POA: Diagnosis present

## 2018-08-22 DIAGNOSIS — R404 Transient alteration of awareness: Secondary | ICD-10-CM | POA: Diagnosis not present

## 2018-08-22 DIAGNOSIS — Z7401 Bed confinement status: Secondary | ICD-10-CM

## 2018-08-22 DIAGNOSIS — L89320 Pressure ulcer of left buttock, unstageable: Secondary | ICD-10-CM | POA: Diagnosis present

## 2018-08-22 DIAGNOSIS — J690 Pneumonitis due to inhalation of food and vomit: Secondary | ICD-10-CM | POA: Diagnosis not present

## 2018-08-22 DIAGNOSIS — Y838 Other surgical procedures as the cause of abnormal reaction of the patient, or of later complication, without mention of misadventure at the time of the procedure: Secondary | ICD-10-CM | POA: Diagnosis present

## 2018-08-22 DIAGNOSIS — K2980 Duodenitis without bleeding: Secondary | ICD-10-CM | POA: Diagnosis not present

## 2018-08-22 DIAGNOSIS — Z4682 Encounter for fitting and adjustment of non-vascular catheter: Secondary | ICD-10-CM | POA: Diagnosis not present

## 2018-08-22 DIAGNOSIS — G9341 Metabolic encephalopathy: Secondary | ICD-10-CM | POA: Diagnosis present

## 2018-08-22 DIAGNOSIS — E876 Hypokalemia: Secondary | ICD-10-CM | POA: Diagnosis present

## 2018-08-22 DIAGNOSIS — J9811 Atelectasis: Secondary | ICD-10-CM | POA: Diagnosis not present

## 2018-08-22 DIAGNOSIS — R112 Nausea with vomiting, unspecified: Secondary | ICD-10-CM | POA: Diagnosis not present

## 2018-08-22 DIAGNOSIS — L8990 Pressure ulcer of unspecified site, unspecified stage: Secondary | ICD-10-CM

## 2018-08-22 DIAGNOSIS — Z0189 Encounter for other specified special examinations: Secondary | ICD-10-CM

## 2018-08-22 HISTORY — PX: ESOPHAGOGASTRODUODENOSCOPY: SHX5428

## 2018-08-22 LAB — COMPREHENSIVE METABOLIC PANEL
ALT: 33 U/L (ref 0–44)
ANION GAP: 11 (ref 5–15)
AST: 32 U/L (ref 15–41)
Albumin: 4.2 g/dL (ref 3.5–5.0)
Alkaline Phosphatase: 74 U/L (ref 38–126)
BUN: 14 mg/dL (ref 8–23)
CALCIUM: 9.2 mg/dL (ref 8.9–10.3)
CO2: 31 mmol/L (ref 22–32)
Chloride: 98 mmol/L (ref 98–111)
Creatinine, Ser: 0.76 mg/dL (ref 0.61–1.24)
GFR calc Af Amer: >60 mL/min (ref >60)
GFR calc non Af Amer: >60 mL/min (ref >60)
Glucose, Bld: 182 mg/dL — ABNORMAL HIGH (ref 70–99)
Potassium: 3.8 mmol/L (ref 3.5–5.1)
Sodium: 140 mmol/L (ref 135–145)
Total Bilirubin: 1 mg/dL (ref 0.3–1.2)
Total Protein: 7.9 g/dL (ref 6.5–8.1)

## 2018-08-22 LAB — HEMOGLOBIN AND HEMATOCRIT, BLOOD
HCT: 38.9 % — ABNORMAL LOW (ref 39.0–52.0)
HCT: 41.4 % (ref 39.0–52.0)
Hemoglobin: 12.6 g/dL — ABNORMAL LOW (ref 13.0–17.0)
Hemoglobin: 12.9 g/dL — ABNORMAL LOW (ref 13.0–17.0)

## 2018-08-22 LAB — TRIGLYCERIDES: Triglycerides: 76 mg/dL (ref <150)

## 2018-08-22 LAB — CBC WITH DIFFERENTIAL/PLATELET
ABS IMMATURE GRANULOCYTES: 0.03 10*3/uL (ref 0.00–0.07)
BASOS PCT: 0 %
Basophils Absolute: 0 10*3/uL (ref 0.0–0.1)
Eosinophils Absolute: 0 10*3/uL (ref 0.0–0.5)
Eosinophils Relative: 0 %
HCT: 42.5 % (ref 39.0–52.0)
Hemoglobin: 14.2 g/dL (ref 13.0–17.0)
Immature Granulocytes: 0 %
Lymphocytes Relative: 17 %
Lymphs Abs: 1.7 10*3/uL (ref 0.7–4.0)
MCH: 28.6 pg (ref 26.0–34.0)
MCHC: 33.4 g/dL (ref 30.0–36.0)
MCV: 85.7 fL (ref 80.0–100.0)
MONO ABS: 0.2 10*3/uL (ref 0.1–1.0)
MONOS PCT: 2 %
NEUTROS ABS: 8.2 10*3/uL — AB (ref 1.7–7.7)
Neutrophils Relative %: 81 %
PLATELETS: 243 10*3/uL (ref 150–400)
RBC: 4.96 MIL/uL (ref 4.22–5.81)
RDW: 13.7 % (ref 11.5–15.5)
WBC: 10.1 10*3/uL (ref 4.0–10.5)
nRBC: 0 % (ref 0.0–0.2)

## 2018-08-22 LAB — GLUCOSE, CAPILLARY
GLUCOSE-CAPILLARY: 174 mg/dL — AB (ref 70–99)
Glucose-Capillary: 204 mg/dL — ABNORMAL HIGH (ref 70–99)

## 2018-08-22 LAB — APTT: aPTT: 32 seconds (ref 24–36)

## 2018-08-22 LAB — PROTIME-INR
INR: 0.91
Prothrombin Time: 12.2 seconds (ref 11.4–15.2)

## 2018-08-22 LAB — ABO/RH: ABO/RH(D): B POS

## 2018-08-22 LAB — MRSA PCR SCREENING: MRSA by PCR: POSITIVE — AB

## 2018-08-22 LAB — LACTIC ACID, PLASMA: Lactic Acid, Venous: 2.9 mmol/L (ref 0.5–1.9)

## 2018-08-22 SURGERY — EGD (ESOPHAGOGASTRODUODENOSCOPY)
Anesthesia: General

## 2018-08-22 MED ORDER — INSULIN ASPART 100 UNIT/ML ~~LOC~~ SOLN: 0.0000 [IU] | Freq: Three times a day (TID) | SUBCUTANEOUS | Status: DC

## 2018-08-22 MED ORDER — SODIUM CHLORIDE 0.9 % IV SOLN
80.0000 mg | Freq: Once | INTRAVENOUS | Status: AC
  Administered 2018-08-22: 80 mg via INTRAVENOUS
  Filled 2018-08-22: qty 80

## 2018-08-22 MED ORDER — ROCURONIUM BROMIDE 50 MG/5ML IV SOLN
INTRAVENOUS | Status: AC
  Administered 2018-08-22: 50 mg via INTRAVENOUS
  Filled 2018-08-22: qty 1

## 2018-08-22 MED ORDER — ROCURONIUM BROMIDE 50 MG/5ML IV SOLN
50.0000 mg | Freq: Once | INTRAVENOUS | Status: AC
  Administered 2018-08-22: 50 mg via INTRAVENOUS

## 2018-08-22 MED ORDER — TROLAMINE SALICYLATE 10 % EX CREA
1.0000 "application " | TOPICAL_CREAM | Freq: Every day | CUTANEOUS | Status: DC | PRN
  Filled 2018-08-22: qty 85

## 2018-08-22 MED ORDER — SODIUM CHLORIDE 0.9 % IV SOLN
1.0000 g | Freq: Three times a day (TID) | INTRAVENOUS | Status: DC
  Filled 2018-08-22 (×4): qty 1

## 2018-08-22 MED ORDER — BUDESONIDE 0.25 MG/2ML IN SUSP
0.2500 mg | Freq: Four times a day (QID) | RESPIRATORY_TRACT | Status: DC
  Administered 2018-08-22 – 2018-08-26 (×16): 0.25 mg via RESPIRATORY_TRACT
  Filled 2018-08-22 (×16): qty 2

## 2018-08-22 MED ORDER — SENNOSIDES 8.8 MG/5ML PO SYRP
2.5000 mL | ORAL_SOLUTION | Freq: Two times a day (BID) | ORAL | Status: DC
  Administered 2018-08-23 – 2018-08-30 (×15): 2.5 mL
  Filled 2018-08-22 (×19): qty 5

## 2018-08-22 MED ORDER — PANTOPRAZOLE SODIUM 40 MG IV SOLR: 40.0000 mg | Freq: Two times a day (BID) | INTRAVENOUS | Status: DC

## 2018-08-22 MED ORDER — ETOMIDATE 2 MG/ML IV SOLN
20.0000 mg | Freq: Once | INTRAVENOUS | Status: AC
  Administered 2018-08-22: 20 mg via INTRAVENOUS

## 2018-08-22 MED ORDER — VANCOMYCIN HCL 10 G IV SOLR
1250.0000 mg | Freq: Once | INTRAVENOUS | Status: AC
  Administered 2018-08-22: 1250 mg via INTRAVENOUS
  Filled 2018-08-22: qty 1250

## 2018-08-22 MED ORDER — CHLORHEXIDINE GLUCONATE 0.12% ORAL RINSE (MEDLINE KIT)
15.0000 mL | Freq: Two times a day (BID) | OROMUCOSAL | Status: DC
  Administered 2018-08-22 – 2018-08-30 (×14): 15 mL via OROMUCOSAL

## 2018-08-22 MED ORDER — FENTANYL CITRATE (PF) 100 MCG/2ML IJ SOLN: 50.0000 ug | INTRAMUSCULAR | Status: DC | PRN

## 2018-08-22 MED ORDER — POLYETHYLENE GLYCOL 3350 17 G PO PACK: 17.0000 g | PACK | Freq: Every day | ORAL | Status: DC | PRN

## 2018-08-22 MED ORDER — METOCLOPRAMIDE HCL 5 MG/ML IJ SOLN
10.0000 mg | Freq: Once | INTRAMUSCULAR | Status: AC
  Administered 2018-08-22: 10 mg via INTRAVENOUS
  Filled 2018-08-22: qty 2

## 2018-08-22 MED ORDER — SODIUM CHLORIDE 0.9 % IV SOLN
8.0000 mg/h | INTRAVENOUS | Status: DC
  Administered 2018-08-22 (×2): 8 mg/h via INTRAVENOUS
  Filled 2018-08-22 (×2): qty 80

## 2018-08-22 MED ORDER — PREGABALIN 50 MG PO CAPS
50.0000 mg | ORAL_CAPSULE | Freq: Two times a day (BID) | ORAL | Status: DC
  Administered 2018-08-23: 50 mg
  Filled 2018-08-22: qty 1

## 2018-08-22 MED ORDER — ADULT MULTIVITAMIN W/MINERALS CH: 1.0000 | ORAL_TABLET | Freq: Every day | ORAL | Status: DC

## 2018-08-22 MED ORDER — SODIUM CHLORIDE 0.9 % IV SOLN
INTRAVENOUS | Status: DC
  Administered 2018-08-22: 15:00:00 via INTRAVENOUS

## 2018-08-22 MED ORDER — SODIUM CHLORIDE 0.9 % IV SOLN
10.0000 mL/h | Freq: Once | INTRAVENOUS | Status: AC
  Administered 2018-08-22: 10 mL/h via INTRAVENOUS

## 2018-08-22 MED ORDER — ALBUTEROL SULFATE (2.5 MG/3ML) 0.083% IN NEBU: 2.5000 mg | INHALATION_SOLUTION | Freq: Four times a day (QID) | RESPIRATORY_TRACT | Status: DC

## 2018-08-22 MED ORDER — ACETAMINOPHEN 650 MG/20.3ML PO SOLN
650.0000 mg | Freq: Three times a day (TID) | ORAL | Status: DC | PRN
  Administered 2018-08-23 – 2018-08-26 (×3): 650 mg
  Filled 2018-08-22 (×5): qty 20.3

## 2018-08-22 MED ORDER — SODIUM CHLORIDE 0.9 % IV SOLN: 80.0000 mg | Freq: Once | INTRAVENOUS | Status: DC

## 2018-08-22 MED ORDER — DOCUSATE SODIUM 50 MG/5ML PO LIQD
100.0000 mg | Freq: Two times a day (BID) | ORAL | Status: DC
  Administered 2018-08-23 – 2018-08-30 (×15): 100 mg
  Filled 2018-08-22 (×17): qty 10

## 2018-08-22 MED ORDER — MUPIROCIN 2 % EX OINT
1.0000 "application " | TOPICAL_OINTMENT | Freq: Two times a day (BID) | CUTANEOUS | Status: DC
  Administered 2018-08-22 – 2018-08-26 (×9): 1 via NASAL
  Filled 2018-08-22 (×2): qty 22

## 2018-08-22 MED ORDER — PROPOFOL 1000 MG/100ML IV EMUL
INTRAVENOUS | Status: AC
  Administered 2018-08-22: 20 ug/kg/min via INTRAVENOUS
  Filled 2018-08-22: qty 100

## 2018-08-22 MED ORDER — ONDANSETRON HCL 4 MG PO TABS: 4.0000 mg | ORAL_TABLET | Freq: Four times a day (QID) | ORAL | Status: DC | PRN

## 2018-08-22 MED ORDER — IPRATROPIUM-ALBUTEROL 0.5-2.5 (3) MG/3ML IN SOLN
3.0000 mL | Freq: Four times a day (QID) | RESPIRATORY_TRACT | Status: DC
  Administered 2018-08-22 – 2018-08-26 (×15): 3 mL via RESPIRATORY_TRACT
  Filled 2018-08-22 (×16): qty 3

## 2018-08-22 MED ORDER — ONDANSETRON HCL 4 MG/2ML IJ SOLN: 4.0000 mg | Freq: Four times a day (QID) | INTRAMUSCULAR | Status: DC | PRN

## 2018-08-22 MED ORDER — DEXTROSE IN LACTATED RINGERS 5 % IV SOLN
INTRAVENOUS | Status: DC
  Administered 2018-08-22: 19:00:00 via INTRAVENOUS

## 2018-08-22 MED ORDER — FENTANYL CITRATE (PF) 100 MCG/2ML IJ SOLN
INTRAMUSCULAR | Status: AC
  Administered 2018-08-22: 100 ug
  Filled 2018-08-22: qty 2

## 2018-08-22 MED ORDER — PIPERACILLIN-TAZOBACTAM 3.375 G IVPB
3.3750 g | Freq: Three times a day (TID) | INTRAVENOUS | Status: DC
  Administered 2018-08-22 – 2018-08-26 (×11): 3.375 g via INTRAVENOUS
  Filled 2018-08-22 (×11): qty 50

## 2018-08-22 MED ORDER — PROPOFOL 1000 MG/100ML IV EMUL
0.0000 ug/kg/min | INTRAVENOUS | Status: DC
  Administered 2018-08-22: 20 ug/kg/min via INTRAVENOUS
  Administered 2018-08-23: 30 ug/kg/min via INTRAVENOUS
  Filled 2018-08-22: qty 100

## 2018-08-22 MED ORDER — DEXTROSE-NACL 5-0.45 % IV SOLN: INTRAVENOUS | Status: DC

## 2018-08-22 MED ORDER — ONDANSETRON HCL 4 MG/2ML IJ SOLN
4.0000 mg | Freq: Once | INTRAMUSCULAR | Status: AC
  Administered 2018-08-22: 4 mg via INTRAVENOUS
  Filled 2018-08-22: qty 2

## 2018-08-22 MED ORDER — ORAL CARE MOUTH RINSE
15.0000 mL | OROMUCOSAL | Status: DC
  Administered 2018-08-22 – 2018-08-23 (×10): 15 mL via OROMUCOSAL

## 2018-08-22 MED ORDER — CHLORHEXIDINE GLUCONATE CLOTH 2 % EX PADS
6.0000 | MEDICATED_PAD | Freq: Every day | CUTANEOUS | Status: AC
  Administered 2018-08-23 – 2018-08-27 (×3): 6 via TOPICAL

## 2018-08-22 MED ORDER — INSULIN ASPART 100 UNIT/ML ~~LOC~~ SOLN: 0.0000 [IU] | Freq: Every day | SUBCUTANEOUS | Status: DC

## 2018-08-22 MED ORDER — SODIUM CHLORIDE 0.9 % IV SOLN: 8.0000 mg/h | INTRAVENOUS | Status: DC

## 2018-08-22 MED ORDER — ATORVASTATIN CALCIUM 20 MG PO TABS
20.0000 mg | ORAL_TABLET | Freq: Every day | ORAL | Status: DC
  Administered 2018-08-23 – 2018-08-30 (×8): 20 mg
  Filled 2018-08-22 (×8): qty 1

## 2018-08-22 MED ORDER — VANCOMYCIN HCL IN DEXTROSE 750-5 MG/150ML-% IV SOLN
750.0000 mg | Freq: Two times a day (BID) | INTRAVENOUS | Status: DC
  Filled 2018-08-22 (×2): qty 150

## 2018-08-22 MED ORDER — ETOMIDATE 2 MG/ML IV SOLN
INTRAVENOUS | Status: AC
  Administered 2018-08-22: 20 mg via INTRAVENOUS
  Filled 2018-08-22: qty 10

## 2018-08-22 MED ORDER — TERAZOSIN HCL 1 MG PO CAPS
1.0000 mg | ORAL_CAPSULE | Freq: Every day | ORAL | Status: DC
  Filled 2018-08-22: qty 1

## 2018-08-22 MED ORDER — NYSTATIN 100000 UNIT/ML MT SUSP
5.0000 mL | Freq: Four times a day (QID) | OROMUCOSAL | Status: DC
  Filled 2018-08-22 (×2): qty 5

## 2018-08-22 NOTE — Progress Notes (Signed)
ET tube pulled back 2cm and secured at 21 cm at lip mark

## 2018-08-22 NOTE — Consult Note (Signed)
Pharmacy Antibiotic Note  Shane Sloan is a 68 y.o. male admitted on 08/22/2018 with pneumonia.  Pharmacy has been consulted for vancomycin dosing. He has a h/o CVA with chronic aphasia/PEG tube placement/bedbound state, presenting from local nursing home with vomiting of blood, hypoxia with O2 saturation in the 80s,  lactic acid 2.9, CXR noted for atelectasis versus pneumonia, large bloody emesis noted in the ED which is dark in color.   Plan: Vancomycin 1250 mg IV once, then 750 mg IV Q 12 hrs. Goal AUC 400-550. Expected AUC: 435 SCr used: 0.76   Weight: 139 lb (63 kg)  Temp (24hrs), Avg:98.7 F (37.1 C), Min:98.5 F (36.9 C), Max:98.9 F (37.2 C)  Recent Labs  Lab 08/22/18 0853  WBC 10.1  CREATININE 0.76  LATICACIDVEN 2.9*    Estimated Creatinine Clearance: 81.1 mL/min (by C-G formula based on SCr of 0.76 mg/dL).    Allergies  Allergen Reactions  . Lisinopril Hives and Swelling    Antimicrobials this admission: vancomycin 2/17 >>  cefepime 2/17 >>   Microbiology results: 2/17 BCx: pending 2/18 MRSA PCR: pending  Thank you for allowing pharmacy to be a part of this patient's care.  Lowella Bandy, PharmD 08/22/2018 12:20 PM

## 2018-08-22 NOTE — ED Notes (Signed)
Dr Don Perking notified of lactic acid.

## 2018-08-22 NOTE — ED Notes (Signed)
Pt still vomiting blood. Dr Don Perking notified.

## 2018-08-22 NOTE — ED Notes (Signed)
Patient transported to X-ray 

## 2018-08-22 NOTE — Op Note (Signed)
[facility] Gastroenterology Patient Name: Shane Flemingsduardo Fadden Procedure Date: 08/22/2018 4:29 PM MRN: [id number] Account #: [id number] Date of Birth: October 23, 1950 Admit Type: Inpatient Age: [age] Room[facility] ENDO ROOM 2 Gender: Male Note Status: Finalized Procedure:            Upper GI endoscopy Indications:          Coffee-ground emesis Providers:            Adron Geisel B. Maximino Greenlandahiliani MD, MD Referring MD:         Charlott RakesFrancisco Hodges (Referring MD) Medicines:            Monitored Anesthesia Care Complications:        No immediate complications. Procedure:            Pre-Anesthesia Assessment:                       - The risks and benefits of the procedure and the                        sedation options and risks were discussed with the                        patient. All questions were answered and informed                        consent was obtained.                       - Patient identification and proposed procedure were                        verified prior to the procedure.                       - ASA Grade Assessment: III - A patient with severe                        systemic disease.                       After obtaining informed consent, the endoscope was                        passed under direct vision. Throughout the procedure,                        the patient's blood pressure, pulse, and oxygen                        saturations were monitored continuously. The Endoscope                        was introduced through the mouth, and advanced to the                        second part of duodenum. The upper GI endoscopy was                        accomplished with ease. The patient tolerated the  procedure well. Findings:      The examined esophagus was normal.      Hematin (altered blood/coffee-ground-like material) was found in the       entire examined stomach. It was cleared as much as possible with       suctioning. Over 1.25 L of thick  liquid suctioned.      There was evidence of a G tube, with the balloon obstructing the pyloric       channel, present in the gastric body. The balloon was carefully and       slowly removed by, gradually pulling the tube away from the external       skin. A superficial ulcer and duodenal erythema without bleeding were       seen in the duodenal bulb at the site where the balloon was causing the       obstruction.      No red or fresh blood noted. Due to the presence of large amount of       hematin (most of it cleared), small lesions may have not been obvious to       see. However, no other lesions besides the one described above were       noted.      One non-bleeding superficial duodenal ulcer with a clean ulcer base       (Forrest Class III) was found in the duodenal bulb. The lesion was 7 mm       in largest dimension.      Hematin (altered blood/coffee-ground-like material) was found in the       second portion of the duodenum. Impression:           - Normal esophagus.                       - Hematin (altered blood/coffee-ground-like material)                        in the entire stomach.                       - G tube, with the balloon obstructing the pyloric                        channel, present.                       - One non-bleeding duodenal ulcer with a clean ulcer                        base (Forrest Class III).                       - Blood in the second portion of the duodenum.                       - No specimens collected.                       - The G tube balloon caused a gastric outlet                        obstruction and a superficial ulcer at the site of the  ulcer. No active bleeding present. This obstruction was                        the cause of the patient's recurrent coffee ground                        emesis. The obstruction was relieved by gradually                        removing the balloon from the pyloric channel by                         pulling the external G tube back to where the balloon                        was taut to the internal stomach. The external bumper                        which was found to be loose at the beginning of the                        procedure, was moved to the 4 cm mark externally (which                        is lightly touching the external skin). Recommendation:       - NPO today. Advance as tolerated tomorrow.                       - Dr. Earlene Plateravis from surgery was notified about the above                        findings and requested to comment on how to prevent the                        external bumper loosening again. He will be seeing the                        patient in this regard.                       - Continue Serial CBCs and transfuse PRN                       - Protonix IV BID for 24 to 48 hrs and then change to                        PO BID for 30 days.                       - The findings and recommendations were discussed with                        the patient's primary physician. Procedure Code(s):    --- Professional ---                       [phone], Esophagogastroduodenoscopy, flexible, transoral;  diagnostic, including collection of specimen(s) by                        brushing or washing, when performed (separate procedure) Diagnosis Code(s):    --- Professional ---                       K92.2, Gastrointestinal hemorrhage, unspecified                       K26.9, Duodenal ulcer, unspecified as acute or chronic,                        without hemorrhage or perforation                       K92.0, Hematemesis CPT copyright 2018 American Medical Association. All rights reserved. The codes documented in this report are preliminary and upon coder review may  be revised to meet current compliance requirements.  Melodie Bouillon, MD Michel Bickers B. Maximino Greenland MD, MD 08/22/2018 5:55:55 PM This report has been signed electronically. Number of  Addenda: 0 Note Initiated On: 08/22/2018 4:29 PM Estimated Blood Loss: Estimated blood loss: none.      [facility]

## 2018-08-22 NOTE — Progress Notes (Signed)
RN call report to sabrina RN from ICU. Pt will be transfer to ICU 8

## 2018-08-22 NOTE — ED Provider Notes (Signed)
[facility] Emergency Department Provider Note  ____________________________________________  Time seen: Approximately 9:47 AM  I have reviewed the triage vital signs and the nursing notes.   HISTORY  Chief Complaint Emesis  Level 5 caveat:  Portions of the history and physical were unable to be obtained due to expressive aphasia   HPI Shane Sloan is a 68 y.o. male with a history of CVA, expressive aphasia, PEG tube, hypertension, hyperlipidemia, gastric and duodenal ulcers who presents for evaluation of hematemesis.  Patient arrives from nursing home with several episodes of hematemesis that started this morning.  No melena.  Patient denies abdominal pain.  Patient is not on aspirin or any anticoagulation.  Per EMS patient was hypoxic on their arrival with concerns for possible aspiration.  Patient denies chest pain or shortness of breath.  Past Medical History:  Diagnosis Date  . Allergy   . Asthma   . Diabetes mellitus without complication (HCC)   . Dysphagia   . GERD (gastroesophageal reflux disease)   . H/O blood clots   . Hyperlipidemia   . Hypertension   . Iron deficiency anemia   . Metabolic encephalopathy   . Prostate disease   . Stroke (HCC) 05/31/2013  . Urinary incontinence     Patient Active Problem List   Diagnosis Date Noted  . Hematemesis 08/22/2018  . GIB (gastrointestinal bleeding) 08/22/2018  . Pressure injury of skin 08/22/2018  . Metabolic encephalopathy   . Primary osteoarthritis involving multiple joints 12/03/2017  . Cognitive deficit due to old cerebrovascular accident (CVA) 11/30/2016  . Polyneuropathy due to medical condition (HCC) 11/30/2016  . Controlled type 2 diabetes mellitus with diabetic autonomic neuropathy, without long-term current use of insulin (HCC) 11/04/2016  . Hyperlipidemia 11/04/2016  . Hypertension 11/04/2016  . BPH associated with nocturia 11/04/2016  . H/O blood clots   . Asthma   .  History of stroke in adulthood 05/31/2013    Past Surgical History:  Procedure Laterality Date  . GASTROSTOMY TUBE PLACEMENT      Prior to Admission medications   Medication Sig Start Date End Date Taking? Authorizing Provider  Acetaminophen 650 MG/20.3ML SUSP Place 650 mg into feeding tube every 8 (eight) hours as needed for fever.    Yes [provider]  atorvastatin (LIPITOR) 20 MG tablet Take 1 tablet (20 mg total) by mouth daily. Patient taking differently: Place 20 mg into feeding tube daily.  05/09/18 08/22/18 Yes Galen ManilaKennedy, Lauren Renee, NP  docusate (COLACE) 50 MG/5ML liquid Place 100 mg into feeding tube 2 (two) times daily.   Yes [provider]  insulin regular (NOVOLIN R,HUMULIN R) 100 units/mL injection Inject 2-12 Units into the skin 4 (four) times daily -  before meals and at bedtime. slinding scale: 150-200=2; 201-250=4; 251-300=6; 301-350=8; 351-400=10; 401+=12 notify MD if BS is greater than 400   Yes [provider]  nystatin (MYCOSTATIN) 100000 UNIT/ML suspension Take 5 mLs by mouth 4 (four) times daily. For thrush for 14 days, use pink sponge to apply to tongue   Yes [provider]  pantoprazole sodium (PROTONIX) 40 mg/20 mL PACK 40 mg by Gastric Tube route 2 (two) times daily. 05/27/18 08/22/18 Yes [provider]  pregabalin (LYRICA) 50 MG capsule Place 50 mg into feeding tube 2 (two) times daily. 05/27/18  Yes [provider]  sennosides (SENOKOT) 8.8 MG/5ML syrup Place 2.5 mLs into feeding tube 2 (two) times daily.   Yes [provider]  terazosin (HYTRIN) 1  MG capsule Place 1 mg into feeding tube at bedtime. 06/09/18  Yes [provider]  trolamine salicylate (ASPERCREME) 10 % cream Apply 1 application topically daily as needed (feet pain).   Yes [provider]    Allergies Lisinopril  History reviewed. No pertinent family history.  Social History Social History   Tobacco Use  .  Smoking status: Never Smoker  . Smokeless tobacco: Never Used  Substance Use Topics  . Alcohol use: No  . Drug use: No    Review of Systems  Constitutional: Negative for fever. Cardiovascular: Negative for chest pain. Respiratory: Negative for shortness of breath. Gastrointestinal: Negative for abdominal pain or diarrhea. + hematemesis  ____________________________________________   PHYSICAL EXAM:  VITAL SIGNS: ED Triage Vitals  Enc Vitals Group     BP 08/22/18 0844 (!) 157/100     Pulse Rate 08/22/18 0844 91     Resp 08/22/18 0844 20     Temp 08/22/18 0844 98.9 F (37.2 C)     Temp Source 08/22/18 0844 Axillary     SpO2 08/22/18 0844 (!) 83 %     Weight 08/22/18 0843 139 lb (63 kg)     Height --      Head Circumference --      Peak Flow --      Pain Score --      Pain Loc --      Pain Edu? --      Excl. in GC? --     Constitutional: Alert, actively vomiting coffee grounds.  HEENT:      Head: Normocephalic and atraumatic.   Large old bruise in the L side of the face      Eyes: Conjunctivae are normal. Sclera is non-icteric.       Mouth/Throat: Mucous membranes are moist.       Neck: Supple with no signs of meningismus. Cardiovascular: Regular rate and rhythm. No murmurs, gallops, or rubs. 2+ symmetrical distal pulses are present in all extremities. No JVD. Respiratory: Normal respiratory effort, hypoxic on RA. Lungs are clear to auscultation bilaterally. No wheezes, crackles, or rhonchi.  Gastrointestinal: Soft, non tender, and non distended with positive bowel sounds. No rebound or guarding.  Rectal exam showing brown stool guaiac negative Musculoskeletal: Nontender with normal range of motion in all extremities. No edema, cyanosis, or erythema of extremities. Neurologic: Normal speech and language. Face is symmetric. Moving all extremities. No gross focal neurologic deficits are appreciated. Skin: Skin is warm, dry and intact. No rash noted. Psychiatric: Mood and  affect are normal. Speech and behavior are normal.  ____________________________________________   LABS (all labs ordered are listed, but only abnormal results are displayed)  Labs Reviewed  MRSA PCR SCREENING - Abnormal; Notable for the following components:      Result Value   MRSA by PCR POSITIVE (*)    All other components within normal limits  CBC WITH DIFFERENTIAL/PLATELET - Abnormal; Notable for the following components:   Neutro Abs 8.2 (*)    All other components within normal limits  COMPREHENSIVE METABOLIC PANEL - Abnormal; Notable for the following components:   Glucose, Bld 182 (*)    All other components within normal limits  LACTIC ACID, PLASMA - Abnormal; Notable for the following components:   Lactic Acid, Venous 2.9 (*)    All other components within normal limits  HEMOGLOBIN AND HEMATOCRIT, BLOOD - Abnormal; Notable for the following components:   Hemoglobin 12.6 (*)    HCT 38.9 (*)  All other components within normal limits  GLUCOSE, CAPILLARY - Abnormal; Notable for the following components:   Glucose-Capillary 204 (*)    All other components within normal limits  GLUCOSE, CAPILLARY - Abnormal; Notable for the following components:   Glucose-Capillary 174 (*)    All other components within normal limits  CULTURE, BLOOD (ROUTINE X 2)  CULTURE, BLOOD (ROUTINE X 2)  EXPECTORATED SPUTUM ASSESSMENT W REFEX TO RESP CULTURE  GRAM STAIN  APTT  PROTIME-INR  HEMOGLOBIN AND HEMATOCRIT, BLOOD  HIV ANTIBODY (ROUTINE TESTING W REFLEX)  LEGIONELLA PNEUMOPHILA SEROGP 1 UR AG  TRIGLYCERIDES  HEMOGLOBIN AND HEMATOCRIT, BLOOD  TYPE AND SCREEN  PREPARE RBC (CROSSMATCH)  ABO/RH   ____________________________________________  EKG  none  ____________________________________________  RADIOLOGY  I have personally reviewed the images performed during this visit and I agree with the Radiologist's read.   Interpretation by Radiologist:  Dg Chest 2 View  Result  Date: 08/22/2018 CLINICAL DATA:  Hypoxia.  Vomiting blood. EXAM: CHEST - 2 VIEW COMPARISON:  None. FINDINGS: Low volume chest with interstitial crowding. Could not exclude mild infiltrate or atelectasis in the retrocardiac lung. No edema, effusion, or pneumothorax. Normal heart size.Apparent percutaneous gastrostomy tube. The stomach is likely distended by gas and fluid. Hemostatic clips versus cholecystectomy clips overlap the right upper quadrant. IMPRESSION: Mild atelectasis or infiltrate in the retrocardiac lung. Electronically Signed   By: Marnee Spring M.D.   On: 08/22/2018 10:37      ____________________________________________   PROCEDURES  Procedure(s) performed: None Procedures Critical Care performed: yes  CRITICAL CARE Performed by: Nita Sickle  ?  Total critical care time: 35 min  Critical care time was exclusive of separately billable procedures and treating other patients.  Critical care was necessary to treat or prevent imminent or life-threatening deterioration.  Critical care was time spent personally by me on the following activities: development of treatment plan with patient and/or surrogate as well as nursing, discussions with consultants, evaluation of patient's response to treatment, examination of patient, obtaining history from patient or surrogate, ordering and performing treatments and interventions, ordering and review of laboratory studies, ordering and review of radiographic studies, pulse oximetry and re-evaluation of patient's condition.  ____________________________________________   INITIAL IMPRESSION / ASSESSMENT AND PLAN / ED COURSE  68 y.o. male with a history of CVA, expressive aphasia, PEG tube, hypertension, hyperlipidemia, gastric and duodenal ulcers who presents for evaluation of hematemesis since his am.  Patient arrives hemodynamically stable, actively vomiting blood.  Had about 200-300 cc of coffee-ground emesis since arrival to the  emergency room.  Not on blood thinners.  History of stomach and duodenal ulcers.  Rectal exam showing brown stool guaiac negative.  Labs showing stable hemoglobin at 14.2 with normal coags.  Lactic slightly elevated at 2.4.  Will give fluids and Zofran, start patient on Protonix bolus and drip.  Will consult surgery.  No history of cirrhosis therefore we will hold octreotide and Rocephin at this time. Type and cross active. Dr. Agustin Cree was consulted and will follow patient in house.       As part of my medical decision making, I reviewed the following data within the electronic MEDICAL RECORD NUMBER Nursing notes reviewed and incorporated, Labs reviewed , Radiograph reviewed , Discussed with admitting physician , A consult was requested and obtained from this/these consultant(s) GI, Notes from prior ED visits and [place] Controlled Substance Database    Pertinent labs & imaging results that were available during my care of the patient  were reviewed by me and considered in my medical decision making (see chart for details).    ____________________________________________   FINAL CLINICAL IMPRESSION(S) / ED DIAGNOSES  Final diagnoses:  Acute respiratory failure with hypoxia (HCC)  Hematemesis with nausea      NEW MEDICATIONS STARTED DURING THIS VISIT:  ED Discharge Orders    None       Note:  This document was prepared using Dragon voice recognition software and may include unintentional dictation errors.    Don Perking, [place], MD 08/22/18 [phone]

## 2018-08-22 NOTE — Consult Note (Signed)
PULMONARY/CCM CONSULT NOTE  Requesting MD/Service: Salary Date of initial consultation: 02/17 Reason for consultation: elective intubation prior to EGD  PT PROFILE: 68 y.o. male with extensive PMH, most significantly devastating stroke 5 years prior to admission, chronic Foley catheter, chronic G-tube presented to [facility] ED with hematemesis and subsequently developed respiratory distress.  Required intubation prior to EGD  MAJOR EVENTS/TEST RESULTS: 02/17 admission as documented above 02/17 EGD:   INDWELLING DEVICES: ETT 02/17 >>    MICRO DATA: MRSA PCR 02/17 >> POS Resp 0217 >>  Blood 02/17 >>   ANTIMICROBIALS:  Vanc 02/17 >> 02/17 Pip-tazo 02/17 >>    HPI:  Patient was brought to ICU from ED with plan for intubation.  He could answer simple questions with nods and shakes of his head but is aphasic.  When I initially saw him, he was in moderate respiratory distress and coughing up frothy brown, feculent secretions.  Past Medical History:  Diagnosis Date  . Allergy   . Asthma   . Diabetes mellitus without complication (HCC)   . Dysphagia   . GERD (gastroesophageal reflux disease)   . H/O blood clots   . Hyperlipidemia   . Hypertension   . Iron deficiency anemia   . Metabolic encephalopathy   . Prostate disease   . Stroke (HCC) 05/31/2013  . Urinary incontinence     Past Surgical History:  Procedure Laterality Date  . GASTROSTOMY TUBE PLACEMENT      MEDICATIONS: I have reviewed all medications and confirmed regimen as documented  Social History   Socioeconomic History  . Marital status: Widowed    Spouse name: Not on file  . Number of children: Not on file  . Years of education: Not on file  . Highest education level: Not on file  Occupational History  . Not on file  Social Needs  . Financial resource strain: Not on file  . Food insecurity:    Worry: Not on file    Inability: Not on file  . Transportation needs:    Medical: Not on file     Non-medical: Not on file  Tobacco Use  . Smoking status: Never Smoker  . Smokeless tobacco: Never Used  Substance and Sexual Activity  . Alcohol use: No  . Drug use: No  . Sexual activity: Not on file  Lifestyle  . Physical activity:    Days per week: Not on file    Minutes per session: Not on file  . Stress: Not on file  Relationships  . Social connections:    Talks on phone: Not on file    Gets together: Not on file    Attends religious service: Not on file    Active member of club or organization: Not on file    Attends meetings of clubs or organizations: Not on file    Relationship status: Not on file  . Intimate partner violence:    Fear of current or ex partner: Not on file    Emotionally abused: Not on file    Physically abused: Not on file    Forced sexual activity: Not on file  Other Topics Concern  . Not on file  Social History Narrative  . Not on file    History reviewed. No pertinent family history.  ROS: Level 5 caveat   Vitals:   08/22/18 1200 08/22/18 1247 08/22/18 1428 08/22/18 1435  BP: (!) 162/101 (!) 129/92  (!) 122/103  Pulse: (!) 102 (!) 113 (!) 116   Resp: Marland Kitchen)  26 19  (!) 24  Temp:  99.4 F (37.4 C) 99.1 F (37.3 C)   TempSrc:  Oral Tympanic   SpO2: 94% 93% (!) 84% 93%  Weight:       Vent Mode: PRVC FiO2 (%):  [100 %] 100 % Set Rate:  [15 bmp] 15 bmp Vt Set:  [400 mL] 400 mL PEEP:  [5 cmH20] 5 cmH20   EXAM:  Gen: Chronically ill-appearing, aphasic, moderate respiratory distress, cognition intact HEENT: NCAT, sclera white, edentulous Neck: Supple without LAN, thyromegaly, JVD Lungs: Scattered rhonchi and wheezes Cardiovascular: RRR, no murmurs noted Abdomen: Soft, diminished BS, G-tube in place Ext: without clubbing, cyanosis, edema Neuro: CNs grossly intact, right hemi-paresis Skin: Limited exam, no lesions noted  DATA:   BMP Latest Ref Rng & Units 08/22/2018 05/09/2018 11/22/2017  Glucose 70 - 99 mg/dL 299(M) 426(S) 341(D)   BUN 8 - 23 mg/dL 14 11 11   Creatinine 0.61 - 1.24 mg/dL 6.22 2.97 9.89  BUN/Creat Ratio 6 - 22 (calc) - NOT APPLICABLE NOT APPLICABLE  Sodium 135 - 145 mmol/L 140 139 142  Potassium 3.5 - 5.1 mmol/L 3.8 3.8 4.0  Chloride 98 - 111 mmol/L 98 103 106  CO2 22 - 32 mmol/L 31 29 29   Calcium 8.9 - 10.3 mg/dL 9.2 9.4 9.1    CBC Latest Ref Rng & Units 08/22/2018 08/22/2018  WBC 4.0 - 10.5 K/uL - 10.1  Hemoglobin 13.0 - 17.0 g/dL 12.6(L) 14.2  Hematocrit 39.0 - 52.0 % 38.9(L) 42.5  Platelets 150 - 400 K/uL - 243    CXR: Initial CXR relatively clear, post intubation film reveals LLL infiltrate  I have personally reviewed all chest radiographs reported above including CXRs and CT chest unless otherwise indicated  IMPRESSION:   1) acute respiratory failure with hypoxemia 2) LLL aspiration pneumonia 3) hematemesis 4) history of CVA with right hemiparesis and aphasia 5) history of asthma with bronchospasm present   PLAN:  Vent settings established Vent bundle implemented Nebulized steroids and bronchodilators ordered Daily SBT as indicated ICU hemodynamic monitoring Maintenance IVFs initiated EGD planned by gastroenterology Monitor temp, WBC count Micro and abx as above DVT px: SCDs heparin Monitor CBC intermittently Transfuse per usual guidelines PAD protocol.  Propofol infusion, intermittent fentanyl    CCM time: 40 mins  The above time includes time spent in consultation with patient and/or family members and reviewing care plan on multidisciplinary rounds  Billy Fischer, MD PCCM service Mobile [phone] Pager [phone] 08/22/2018 5:39 PM

## 2018-08-22 NOTE — Consult Note (Signed)
Shane BouillonVarnita Alica Shellhammer, MD [address], [address], [place], [place], [id number] [address], [address], [place], [place], [address] Phone: [phone]  Fax: [phone]  Consultation  Referring Provider:     Dr. salary Primary Care Physician:  Charlott RakesHodges, [place], MD Reason for Consultation:     GI bleed  Date of Admission:  08/22/2018 Date of Consultation:  08/22/2018         HPI:   Shane Sloan is a 68 y.o. male admitted with hematemesis.  Patient with known history of CVA, chronic aphasia, previous PEG tube placement, presented with hematemesis that started this morning.  [facility] after pulling out his G-tube on May 31, 2018, 5 to 6 days after G-tube placement by IR.  CT scan showed pneumoperitoneum and contrast extravasation into the abdomen.  Patient underwent laparotomy and G-tube placement on June 01 2018 by surgery.  That admission also notes that patient had upper GI bleed with duodenitis on that admission.  EGD showed 1 nonbleeding duodenal ulcer with adherent clot, clip was placed.  Other nonbleeding gastric ulcers and nonbleeding duodenal ulcers, and erythema in the esophagus (likely from nasogastric tube).  Past Medical History:  Diagnosis Date  . Allergy   . Asthma   . Diabetes mellitus without complication (HCC)   . Dysphagia   . GERD (gastroesophageal reflux disease)   . H/O blood clots   . Hyperlipidemia   . Hypertension   . Iron deficiency anemia   . Metabolic encephalopathy   . Prostate disease   . Stroke (HCC) 05/31/2013  . Urinary incontinence     Past Surgical History:  Procedure Laterality Date  . GASTROSTOMY TUBE PLACEMENT      Prior to Admission medications   Medication Sig Start Date End Date Taking? Authorizing Provider  Acetaminophen 650 MG/20.3ML SUSP Place 650 mg into feeding tube every 8 (eight) hours as needed for fever.    Yes [provider]  atorvastatin (LIPITOR) 20 MG  tablet Take 1 tablet (20 mg total) by mouth daily. Patient taking differently: Place 20 mg into feeding tube daily.  05/09/18 08/22/18 Yes Galen ManilaKennedy, Lauren Renee, NP  docusate (COLACE) 50 MG/5ML liquid Place 100 mg into feeding tube 2 (two) times daily.   Yes [provider]  insulin regular (NOVOLIN R,HUMULIN R) 100 units/mL injection Inject 2-12 Units into the skin 4 (four) times daily -  before meals and at bedtime. slinding scale: 150-200=2; 201-250=4; 251-300=6; 301-350=8; 351-400=10; 401+=12 notify MD if BS is greater than 400   Yes [provider]  nystatin (MYCOSTATIN) 100000 UNIT/ML suspension Take 5 mLs by mouth 4 (four) times daily. For thrush for 14 days, use pink sponge to apply to tongue   Yes [provider]  pantoprazole sodium (PROTONIX) 40 mg/20 mL PACK 40 mg by Gastric Tube route 2 (two) times daily. 05/27/18 08/22/18 Yes [provider]  pregabalin (LYRICA) 50 MG capsule Place 50 mg into feeding tube 2 (two) times daily. 05/27/18  Yes [provider]  sennosides (SENOKOT) 8.8 MG/5ML syrup Place 2.5 mLs into feeding tube 2 (two) times daily.   Yes [provider]  terazosin (HYTRIN) 1 MG capsule Place 1 mg into feeding tube at bedtime. 06/09/18  Yes [provider]  trolamine salicylate (ASPERCREME) 10 % cream Apply 1 application topically daily as needed (feet pain).   Yes [provider]    History reviewed. No pertinent family history.   Social History  Tobacco Use  . Smoking status: Never Smoker  . Smokeless tobacco: Never Used  Substance Use Topics  . Alcohol use: No  . Drug use: No    Allergies as of 08/22/2018 - Review Complete 08/22/2018  Allergen Reaction Noted  . Lisinopril Hives and Swelling 11/26/2015    Review of Systems:    All systems reviewed and negative except where noted in HPI.   Physical Exam:  Vital signs in last 24 hours: Vitals:   08/22/18 0844 08/22/18 0900 08/22/18 0930  08/22/18 1023  BP: (!) 157/100 139/88 (!) 145/103 (!) 155/97  Pulse: 91 95 91 96  Resp: 20 (!) 25 20 20   Temp: 98.9 F (37.2 C)     TempSrc: Axillary     SpO2: (!) 83% 95% 99% 100%  Weight:         General:   Pleasant, cooperative in NAD Head:  Normocephalic and atraumatic. Eyes:   No icterus.   Conjunctiva pink. PERRLA. Ears:  Normal auditory acuity. Neck:  Supple; no masses or thyroidomegaly Lungs: Respirations even and unlabored. Lungs clear to auscultation bilaterally.   No wheezes, crackles, or rhonchi.  Abdomen:  Soft, nondistended, nontender. Normal bowel sounds. No appreciable masses or hepatomegaly.  No rebound or guarding.  Neurologic:  Alert and oriented x3;  grossly normal neurologically. Skin:  Intact without significant lesions or rashes. Cervical Nodes:  No significant cervical adenopathy. Psych:  Alert and cooperative. Normal affect.  LAB RESULTS: Recent Labs    08/22/18 0853  WBC 10.1  HGB 14.2  HCT 42.5  PLT 243   BMET Recent Labs    08/22/18 0853  NA 140  K 3.8  CL 98  CO2 31  GLUCOSE 182*  BUN 14  CREATININE 0.76  CALCIUM 9.2   LFT Recent Labs    08/22/18 0853  PROT 7.9  ALBUMIN 4.2  AST 32  ALT 33  ALKPHOS 74  BILITOT 1.0   PT/INR Recent Labs    08/22/18 0853  LABPROT 12.2  INR 0.91    STUDIES: Dg Chest 2 View  Result Date: 08/22/2018 CLINICAL DATA:  Hypoxia.  Vomiting blood. EXAM: CHEST - 2 VIEW COMPARISON:  None. FINDINGS: Low volume chest with interstitial crowding. Could not exclude mild infiltrate or atelectasis in the retrocardiac lung. No edema, effusion, or pneumothorax. Normal heart size.Apparent percutaneous gastrostomy tube. The stomach is likely distended by gas and fluid. Hemostatic clips versus cholecystectomy clips overlap the right upper quadrant. IMPRESSION: Mild atelectasis or infiltrate in the retrocardiac lung. Electronically Signed   By: Marnee Spring M.D.   On: 08/22/2018 10:37      Impression / Plan:     Shane Sloan is a 68 y.o. y/o male with history of CVA, aphasia, previous G-tube placement, presents with hematemesis with history of gastric and duodenal ulcers in November 2019  Patient continues to have emesis of dark brown liquid. I have recommended NG tube placement to low intermittent wall suction  We will plan on endoscopy today to evaluate for etiology of bleeding.  Peptic ulcer disease is high on the differential given his previous history.  PPI IV twice daily  Continue serial CBCs and transfuse PRN Avoid NSAIDs Maintain 2 large-bore IV lines Please page GI with any acute hemodynamic changes, or signs of active GI bleeding  Low threshold for ICU transfer  Continue n.p.o.  Thank you for involving me in the care of this patient.      LOS: 0 days   Toryn Dewalt  Ralene Bathe, MD  08/22/2018, 11:43 AM

## 2018-08-22 NOTE — Progress Notes (Signed)
Pt was emergently intubated and EGD performed once pt arrived on unit. Upon completion of EGD, pt was cleaned up and bed linens changed.

## 2018-08-22 NOTE — Procedures (Signed)
Oral Intubation Procedure Note  Indications: Respiratory insufficiency prior to EGD Consent: Verbal consent obtained Time Out: Verified patient identification, verified procedure, site/side was marked, verified correct patient position, special equipment/implants available, medications/allergies/relevent history reviewed, required imaging and test results available.   Pre-meds: Fentanyl 50 mcg, Etomidate 20 mg IV  Neuromuscular blockade: Rocuronium 50 mg IV  Laryngoscope: #3 MAC  Visualization: cords fully visualized  ETT: 7.5 ETT passed on first attempt and secured @ 23 cm at corner of mouth  Findings: edentulous, normal upper airway, copious frothy brown secretions. Easy intubation   Evaluation:  Tube position confirmed by auscultation and EZCap CXR reveals ETT 1 cm above main carina. Retracted 2 cm  Pt tolerated procedure well without complications   Merton Border, MD PCCM service Mobile [phone] Pager [phone] 08/22/2018 4:38 PM

## 2018-08-22 NOTE — H&P (Signed)
[employer] - [place] [facility]   PATIENT NAME: Shane Sloan    MR#:  [id number]  DATE OF BIRTH:  [birth year]  DATE OF ADMISSION:  08/22/2018  PRIMARY CARE PHYSICIAN: Charlott Rakes, MD   REQUESTING/REFERRING PHYSICIAN:   CHIEF COMPLAINT:   Chief Complaint  Patient presents with  . Emesis    HISTORY OF PRESENT ILLNESS: Shane Sloan  is a 68 y.o. male with a known history per below which includes history of CVA with chronic aphasia/PEG tube placement/bedbound state, presenting from local nursing home with vomiting of blood, patient noted to be hypoxic with O2 saturation in the 80s, ER evaluation noted for lactic acid 2.9, chest x-ray noted for atelectasis versus pneumonia, large bloody emesis noted in the emergency room which is dark in color, ED attending did discuss case with gastroenterology on-call, hospitalist asked to admit, patient evaluated in the emergency room, patient is poor historian due to chronic expressive aphasia from remote stroke but is able to answer questions by nodding his head yes, patient is now been admitted for acute hematemesis, acute hypoxic respiratory failure most likely secondary to aspiration pneumonia.  PAST MEDICAL HISTORY:   Past Medical History:  Diagnosis Date  . Allergy   . Asthma   . Diabetes mellitus without complication (HCC)   . Dysphagia   . GERD (gastroesophageal reflux disease)   . H/O blood clots   . Hyperlipidemia   . Hypertension   . Iron deficiency anemia   . Metabolic encephalopathy   . Prostate disease   . Stroke (HCC) 05/31/2013  . Urinary incontinence     PAST SURGICAL HISTORY:  Past Surgical History:  Procedure Laterality Date  . GASTROSTOMY TUBE PLACEMENT      SOCIAL HISTORY:  Social History   Tobacco Use  . Smoking status: Never Smoker  . Smokeless tobacco: Never Used  Substance Use Topics  . Alcohol use: No    FAMILY HISTORY:  Hypertension  DRUG ALLERGIES:  Allergies  Allergen  Reactions  . Lisinopril Hives and Swelling    REVIEW OF SYSTEMS: Unable to be obtained given expressive aphasia  CONSTITUTIONAL: No fever, fatigue or weakness.  EYES: No blurred or double vision.  EARS, NOSE, AND THROAT: No tinnitus or ear pain.  RESPIRATORY: No cough, shortness of breath, wheezing or hemoptysis.  CARDIOVASCULAR: No chest pain, orthopnea, edema.  GASTROINTESTINAL: No nausea, vomiting, diarrhea or abdominal pain.  GENITOURINARY: No dysuria, hematuria.  ENDOCRINE: No polyuria, nocturia,  HEMATOLOGY: No anemia, easy bruising or bleeding SKIN: No rash or lesion. MUSCULOSKELETAL: No joint pain or arthritis.   NEUROLOGIC: No tingling, numbness, weakness.  PSYCHIATRY: No anxiety or depression.   MEDICATIONS AT HOME:  Prior to Admission medications   Medication Sig Start Date End Date Taking? Authorizing Provider  Acetaminophen 650 MG/20.3ML SUSP Place 650 mg into feeding tube every 8 (eight) hours as needed for fever.    Yes [provider]  atorvastatin (LIPITOR) 20 MG tablet Take 1 tablet (20 mg total) by mouth daily. Patient taking differently: Place 20 mg into feeding tube daily.  05/09/18 08/22/18 Yes Galen Manila, NP  docusate (COLACE) 50 MG/5ML liquid Place 100 mg into feeding tube 2 (two) times daily.   Yes [provider]  insulin regular (NOVOLIN R,HUMULIN R) 100 units/mL injection Inject 2-12 Units into the skin 4 (four) times daily -  before meals and at bedtime. slinding scale: 150-200=2; 201-250=4; 251-300=6; 301-350=8; 351-400=10; 401+=12 notify MD if BS is greater than 400  Yes [provider]  nystatin (MYCOSTATIN) 100000 UNIT/ML suspension Take 5 mLs by mouth 4 (four) times daily. For thrush for 14 days, use pink sponge to apply to tongue   Yes [provider]  pantoprazole sodium (PROTONIX) 40 mg/20 mL PACK 40 mg by Gastric Tube route 2 (two) times daily. 05/27/18 08/22/18 Yes [provider]  pregabalin  (LYRICA) 50 MG capsule Place 50 mg into feeding tube 2 (two) times daily. 05/27/18  Yes [provider]  sennosides (SENOKOT) 8.8 MG/5ML syrup Place 2.5 mLs into feeding tube 2 (two) times daily.   Yes [provider]  terazosin (HYTRIN) 1 MG capsule Place 1 mg into feeding tube at bedtime. 06/09/18  Yes [provider]  trolamine salicylate (ASPERCREME) 10 % cream Apply 1 application topically daily as needed (feet pain).   Yes [provider]      PHYSICAL EXAMINATION:   VITAL SIGNS: Blood pressure (!) 155/97, pulse 96, temperature 98.9 F (37.2 C), temperature source Axillary, resp. rate 20, weight 63 kg, SpO2 100 %.  GENERAL:  68 y.o.-year-old patient lying in the bed with mild acute distress.  Cachectic appearing EYES: Pupils equal, round, reactive to light and accommodation. No scleral icterus. Extraocular muscles intact.  HEENT: Head atraumatic, normocephalic. Oropharynx and nasopharynx clear.  NECK:  Supple, no jugular venous distention. No thyroid enlargement, no tenderness.  LUNGS: Diminished breath sounds with rhonchi. No use of accessory muscles of respiration.  CARDIOVASCULAR: S1, S2 normal. No murmurs, rubs, or gallops.  ABDOMEN: Soft, nontender, nondistended. Bowel sounds present. No organomegaly or mass.  EXTREMITIES: No pedal edema, cyanosis, or clubbing.  Severe diffuse muscular wasting NEUROLOGIC: Cranial nerves II through XII are intact.  Right hemiparesis/expressive aphasia MAES. Gait not checked.  PSYCHIATRIC: The patient is awake, alert, nonverbal due to expressive aphasia   SKIN: No obvious rash, lesion, or ulcer.   LABORATORY PANEL:   CBC Recent Labs  Lab 08/22/18 0853  WBC 10.1  HGB 14.2  HCT 42.5  PLT 243  MCV 85.7  MCH 28.6  MCHC 33.4  RDW 13.7  LYMPHSABS 1.7  MONOABS 0.2  EOSABS 0.0  BASOSABS 0.0    ------------------------------------------------------------------------------------------------------------------  Chemistries  Recent Labs  Lab 08/22/18 0853  NA 140  K 3.8  CL 98  CO2 31  GLUCOSE 182*  BUN 14  CREATININE 0.76  CALCIUM 9.2  AST 32  ALT 33  ALKPHOS 74  BILITOT 1.0   ------------------------------------------------------------------------------------------------------------------ estimated creatinine clearance is 81.1 mL/min (by C-G formula based on SCr of 0.76 mg/dL). ------------------------------------------------------------------------------------------------------------------ No results for input(s): TSH, T4TOTAL, T3FREE, THYROIDAB in the last 72 hours.  Invalid input(s): FREET3   Coagulation profile Recent Labs  Lab 08/22/18 0853  INR 0.91   ------------------------------------------------------------------------------------------------------------------- No results for input(s): DDIMER in the last 72 hours. -------------------------------------------------------------------------------------------------------------------  Cardiac Enzymes No results for input(s): CKMB, TROPONINI, MYOGLOBIN in the last 168 hours.  Invalid input(s): CK ------------------------------------------------------------------------------------------------------------------ Invalid input(s): POCBNP  ---------------------------------------------------------------------------------------------------------------  Urinalysis No results found for: COLORURINE, APPEARANCEUR, LABSPEC, PHURINE, GLUCOSEU, HGBUR, BILIRUBINUR, KETONESUR, PROTEINUR, UROBILINOGEN, NITRITE, LEUKOCYTESUR   RADIOLOGY: Dg Chest 2 View  Result Date: 08/22/2018 CLINICAL DATA:  Hypoxia.  Vomiting blood. EXAM: CHEST - 2 VIEW COMPARISON:  None. FINDINGS: Low volume chest with interstitial crowding. Could not exclude mild infiltrate or atelectasis in the retrocardiac lung. No edema, effusion, or  pneumothorax. Normal heart size.Apparent percutaneous gastrostomy tube. The stomach is likely distended by gas and fluid. Hemostatic clips versus cholecystectomy clips overlap the right upper  quadrant. IMPRESSION: Mild atelectasis or infiltrate in the retrocardiac lung. Electronically Signed   By: Marnee SpringJonathon  Watts M.D.   On: 08/22/2018 10:37    EKG: Orders placed or performed during the hospital encounter of 08/22/18  . ED EKG  . ED EKG  . EKG 12-Lead  . EKG 12-Lead    IMPRESSION AND PLAN: *Acute hematemesis Noted history of peptic ulcer disease, status post PEG tube Admit to regular nursing for bed, Protonix drip, avoid antiplatelet/anticoagulant/NSAID medications, H&H every 6 hours, CBC daily, type and screen, transfuse if needed, gastroenterology to see, n.p.o. after midnight  *Acute hypoxic respiratory failure Suspected due to aspiration HCAP from hematemesis Aspiration precautions, supplemental oxygen with weaning as tolerated  *Acute suspected aspiration HCAP Pneumonia protocol, empiric cefepime/vancomycin, follow-up on cultures  *History of peptic ulcer disease Protonix drip per above  *History of CVA with expressive aphasia/status post PEG tube/bedbound state/malnourished appearance Increase nursing care PRN, aspiration/fall/skin care precautions while in house Long-term prognosis is poor-palliative care consulted  *Chronic diabetes mellitus type 2 Scale insulin with Accu-Cheks per routine  *History of asthma without exacerbation Breathing treatments PRN  *Chronic hypertension Stable Continue current regiment   All the records are reviewed and case discussed with ED provider. Management plans discussed with the patient, family and they are in agreement.  CODE STATUS:full    TOTAL TIME TAKING CARE OF THIS PATIENT: 45 minutes.    Evelena AsaMontell D Nayali Talerico M.D on 08/22/2018   Between 7am to 6pm - Pager - [phone]  After 6pm go to [email or web address] - password [email or web address]  [facility]  [facility]  Office  [phone]  CC: Primary care physician; Charlott RakesHodges, Francisco, MD   Note: This dictation was prepared with Dragon dictation along with smaller phrase technology. Any transcriptional errors that result from this process are unintentional.

## 2018-08-22 NOTE — Progress Notes (Signed)
RN notified pt;s daughter he is admitted to the hospital.

## 2018-08-22 NOTE — Consult Note (Signed)
Pharmacy Antibiotic Note  Shane Sloan is a 68 y.o. male admitted on 08/22/2018 with pneumonia.  Pharmacy has been consulted for vancomycin dosing. He has a h/o CVA with chronic aphasia/PEG tube placement/bedbound state, presenting from local nursing home with vomiting of blood, hypoxia with O2 saturation in the 80s,  lactic acid 2.9, CXR noted for atelectasis versus pneumonia, large bloody emesis noted in the ED which is dark in color.   Plan: Zosyn EI 3.375g IV Q8hr.   Weight: 139 lb (63 kg)  Temp (24hrs), Avg:99 F (37.2 C), Min:98.5 F (36.9 C), Max:99.4 F (37.4 C)  Recent Labs  Lab 08/22/18 0853  WBC 10.1  CREATININE 0.76  LATICACIDVEN 2.9*    Estimated Creatinine Clearance: 81.1 mL/min (by C-G formula based on SCr of 0.76 mg/dL).    Allergies  Allergen Reactions  . Lisinopril Hives and Swelling    Antimicrobials this admission: vancomycin 2/17 x 1 Zosyn 2/17 >>  Microbiology results: 2/17 BCx: pending 2/17 Tracheal Aspirate: pending  2/18 MRSA PCR: positive   Thank you for allowing pharmacy to be a part of this patient's care.  Simpson,Michael L, PharmD 08/22/2018 5:02 PM

## 2018-08-22 NOTE — ED Triage Notes (Addendum)
Vomiting blood in room. Started over night. Mostly nonverbal r/t stroke but can communicate. 4 mg zofran given by ems. Blood sugar 199 by ems.pt arrived with foley and peg tube

## 2018-08-22 NOTE — ED Notes (Signed)
ED TO INPATIENT HANDOFF REPORT  Name/Age/Gender Shane FlemingsEduardo Sloan 68 y.o. male  Code Status   Home/SNF/Other Nursing Home [facility]  Chief Complaint vomiting  Level of Care/Admitting Diagnosis ED Disposition    ED Disposition Condition Comment   Admit  Hospital Area: [facility] [[id number]]  Level of Care: Med-Surg [16]  Diagnosis: GIB (gastrointestinal bleeding) [409811][[id number]]  Admitting Physician: Bertrum SolSALARY, MONTELL D [[id number]]  Attending Physician: Bertrum SolSALARY, MONTELL D [[id number]]  Estimated length of stay: past midnight tomorrow  Certification:: I certify this patient will need inpatient services for at least 2 midnights  PT Class (Do Not Modify): Inpatient [101]  PT Acc Code (Do Not Modify): Private [1]       Medical History Past Medical History:  Diagnosis Date  . Allergy   . Asthma   . Diabetes mellitus without complication (HCC)   . Dysphagia   . GERD (gastroesophageal reflux disease)   . H/O blood clots   . Hyperlipidemia   . Hypertension   . Iron deficiency anemia   . Metabolic encephalopathy   . Prostate disease   . Stroke (HCC) 05/31/2013  . Urinary incontinence     Allergies Allergies  Allergen Reactions  . Lisinopril Hives and Swelling    IV Location/Drains/Wounds Patient Lines/Drains/Airways Status   Active Line/Drains/Airways    Name:   Placement date:   Placement time:   Site:   Days:   Peripheral IV 08/22/18 Right Hand   08/22/18    -    Hand   less than 1   Peripheral IV 08/22/18 Right Antecubital   08/22/18    0855    Antecubital   less than 1   Urethral Catheter Non-latex 14 Fr.   05/31/18    0944    Non-latex   83          Labs/Imaging Results for orders placed or performed during the hospital encounter of 08/22/18 (from the past 48 hour(s))  CBC with Differential/Platelet     Status: Abnormal   Collection Time: 08/22/18  8:53 AM  Result Value Ref Range   WBC 10.1 4.0 - 10.5 K/uL   RBC 4.96 4.22 - 5.81 MIL/uL    Hemoglobin 14.2 13.0 - 17.0 g/dL   HCT 65.742.5 84.639.0 - 96.252.0 %   MCV 85.7 80.0 - 100.0 fL   MCH 28.6 26.0 - 34.0 pg   MCHC 33.4 30.0 - 36.0 g/dL   RDW 95.213.7 84.111.5 - 32.415.5 %   Platelets 243 150 - 400 K/uL   nRBC 0.0 0.0 - 0.2 %   Neutrophils Relative % 81 %   Neutro Abs 8.2 (H) 1.7 - 7.7 K/uL   Lymphocytes Relative 17 %   Lymphs Abs 1.7 0.7 - 4.0 K/uL   Monocytes Relative 2 %   Monocytes Absolute 0.2 0.1 - 1.0 K/uL   Eosinophils Relative 0 %   Eosinophils Absolute 0.0 0.0 - 0.5 K/uL   Basophils Relative 0 %   Basophils Absolute 0.0 0.0 - 0.1 K/uL   Immature Granulocytes 0 %   Abs Immature Granulocytes 0.03 0.00 - 0.07 K/uL    Comment: Performed at [facility], [address] [place], [place] [id number]  Type and screen     Status: None (Preliminary result)   Collection Time: 08/22/18  8:53 AM  Result Value Ref Range   ABO/RH(D) B POS    Antibody Screen NEG    Sample Expiration 08/25/2018    Unit Number [id number]  Blood Component Type RED CELLS,LR    Unit division 00    Status of Unit ALLOCATED    Transfusion Status OK TO TRANSFUSE    Crossmatch Result      Compatible Performed at [facility], [address] [place], [place] [address]    Unit Number [id number]    Blood Component Type RED CELLS,LR    Unit division 00    Status of Unit ALLOCATED    Transfusion Status OK TO TRANSFUSE    Crossmatch Result Compatible   Comprehensive metabolic panel     Status: Abnormal   Collection Time: 08/22/18  8:53 AM  Result Value Ref Range   Sodium 140 135 - 145 mmol/L   Potassium 3.8 3.5 - 5.1 mmol/L   Chloride 98 98 - 111 mmol/L   CO2 31 22 - 32 mmol/L   Glucose, Bld 182 (H) 70 - 99 mg/dL   BUN 14 8 - 23 mg/dL   Creatinine, Ser 0.26 0.61 - 1.24 mg/dL   Calcium 9.2 8.9 - 37.8 mg/dL   Total Protein 7.9 6.5 - 8.1 g/dL   Albumin 4.2 3.5 - 5.0 g/dL   AST 32 15 - 41 U/L   ALT 33 0 - 44 U/L   Alkaline Phosphatase 74 38 - 126 U/L   Total Bilirubin 1.0 0.3  - 1.2 mg/dL   GFR calc non Af Amer >60 >60 mL/min   GFR calc Af Amer >60 >60 mL/min   Anion gap 11 5 - 15    Comment: Performed at [facility], [address] [place], [place] [address]  APTT     Status: None   Collection Time: 08/22/18  8:53 AM  Result Value Ref Range   aPTT 32 24 - 36 seconds    Comment: Performed at [facility], [address] [place], [place] [address]  Protime-INR     Status: None   Collection Time: 08/22/18  8:53 AM  Result Value Ref Range   Prothrombin Time 12.2 11.4 - 15.2 seconds   INR 0.91     Comment: Performed at [facility], [address] [place], [place] [address]  Lactic acid, plasma     Status: Abnormal   Collection Time: 08/22/18  8:53 AM  Result Value Ref Range   Lactic Acid, Venous 2.9 (HH) 0.5 - 1.9 mmol/L    Comment: CRITICAL RESULT CALLED TO, READ BACK BY AND VERIFIED WITH Henryk Ursin@0930  ON 08/22/18 BY HKP Performed at [facility], [address] [place], [place] [address]   Prepare RBC     Status: None   Collection Time: 08/22/18  9:00 AM  Result Value Ref Range   Order Confirmation      ORDER PROCESSED BY BLOOD BANK Performed at [facility], [address] [place], [place] [address]   ABO/Rh     Status: None   Collection Time: 08/22/18 10:00 AM  Result Value Ref Range   ABO/RH(D)      B POS Performed at [facility], [address] [place], [place] [address]   Hemoglobin and hematocrit, blood     Status: Abnormal   Collection Time: 08/22/18 11:45 AM  Result Value Ref Range   Hemoglobin 12.6 (L) 13.0 - 17.0 g/dL   HCT 62.9 (L) 47.6 - 54.6 %    Comment: Performed at [facility], [address], [place], [place] [address]   Dg Chest 2 View  Result Date: 08/22/2018 CLINICAL DATA:  Hypoxia.  Vomiting blood. EXAM: CHEST - 2 VIEW COMPARISON:  None. FINDINGS: Low volume chest with interstitial crowding. Could not exclude mild infiltrate or  atelectasis in the retrocardiac lung. No edema, effusion, or pneumothorax. Normal heart size.Apparent percutaneous gastrostomy tube. The stomach is likely distended by gas and fluid. Hemostatic clips versus cholecystectomy clips overlap the right upper quadrant. IMPRESSION: Mild atelectasis or infiltrate in the retrocardiac lung. Electronically Signed   By: Marnee Spring M.D.   On: 08/22/2018 10:37    Pending Labs Unresulted Labs (From admission, onward)    Start     Ordered   08/22/18 1145  Hemoglobin and hematocrit, blood  Now then every 8 hours,   STAT     08/22/18 1144   Signed and Held  HIV antibody (Routine Testing)  Once,   R     Signed and Held   Signed and Held  CBC  Tomorrow morning,   R     Signed and Held   Signed and Held  Basic metabolic panel  Tomorrow morning,   R     Signed and Held   Signed and Held  Culture, blood (routine x 2) Call MD if unable to obtain prior to antibiotics being given  BLOOD CULTURE X 2,   R    Comments:  If blood cultures drawn in Emergency Department - Do not draw and cancel order    Signed and Held   Signed and Held  Culture, sputum-assessment  Once,   R     Signed and Held   Signed and Held  Gram stain  Once,   R     Signed and Held   Signed and Held  HIV antibody (Routine Screening)  Once,   R     Signed and Held   Signed and Held  Strep pneumoniae urinary antigen  Once,   R     Signed and Held   Signed and Held  Legionella Pneumophila Serogp 1 Ur Ag  Once,   R     Signed and Held   Signed and Held  Type and screen [facility]  Once,   R    Comments:  [facility]    Signed and Held          Vitals/Pain Today's Vitals   08/22/18 0930 08/22/18 1023 08/22/18 1030 08/22/18 1100  BP: (!) 145/103 (!) 155/97 (!) 153/84 (!) 147/95  Pulse: 91 96 93 (!) 106  Resp: 20 20 (!) 23 (!) 31  Temp:    98.5 F (36.9 C)  TempSrc:    Axillary  SpO2: 99% 100% 98% 96%  Weight:        Isolation  Precautions No active isolations  Medications Medications  0.9 %  sodium chloride infusion (has no administration in time range)  pantoprazole (PROTONIX) 80 mg in sodium chloride 0.9 % 250 mL (0.32 mg/mL) infusion (8 mg/hr Intravenous Transfusing/Transfer 08/22/18 1154)  pantoprazole (PROTONIX) injection 40 mg (has no administration in time range)  insulin aspart (novoLOG) injection 0-15 Units (has no administration in time range)  insulin aspart (novoLOG) injection 0-5 Units (has no administration in time range)  vancomycin (VANCOCIN) 1,250 mg in sodium chloride 0.9 % 250 mL IVPB (1,250 mg Intravenous Transfusing/Transfer 08/22/18 1154)  pantoprazole (PROTONIX) 80 mg in sodium chloride 0.9 % 100 mL IVPB (0 mg Intravenous Stopped 08/22/18 0955)  ondansetron (ZOFRAN) injection 4 mg (4 mg Intravenous Given 08/22/18 0911)  metoCLOPramide (REGLAN) injection 10  mg (10 mg Intravenous Given 08/22/18 1022)    Mobility manual wheelchair

## 2018-08-22 NOTE — Progress Notes (Signed)
Family Meeting Note  Advance Directive:yes  Today a meeting took place with the Patient.  Patient is able to participate   The following clinical team members were present during this meeting:MD  The following were discussed:Patient's diagnosis: GI bleeding, CVA with aphasia/bedbound state/PEG tube dependent/nonverbal state, Patient's progosis: Unable to determine and Goals for treatment: Full Code  Additional follow-up to be provided: prn  Time spent during discussion:20 minutes  Bertrum Sol, MD

## 2018-08-23 ENCOUNTER — Inpatient Hospital Stay: Payer: Medicare HMO

## 2018-08-23 ENCOUNTER — Encounter: Payer: Self-pay | Admitting: Gastroenterology

## 2018-08-23 DIAGNOSIS — Z7189 Other specified counseling: Secondary | ICD-10-CM

## 2018-08-23 DIAGNOSIS — K920 Hematemesis: Secondary | ICD-10-CM

## 2018-08-23 DIAGNOSIS — J9601 Acute respiratory failure with hypoxia: Secondary | ICD-10-CM

## 2018-08-23 DIAGNOSIS — Z515 Encounter for palliative care: Secondary | ICD-10-CM

## 2018-08-23 LAB — CBC
HCT: 35.1 % — ABNORMAL LOW (ref 39.0–52.0)
Hemoglobin: 11.1 g/dL — ABNORMAL LOW (ref 13.0–17.0)
MCH: 28.2 pg (ref 26.0–34.0)
MCHC: 31.6 g/dL (ref 30.0–36.0)
MCV: 89.3 fL (ref 80.0–100.0)
PLATELETS: 176 10*3/uL (ref 150–400)
RBC: 3.93 MIL/uL — ABNORMAL LOW (ref 4.22–5.81)
RDW: 14.1 % (ref 11.5–15.5)
WBC: 16.1 10*3/uL — ABNORMAL HIGH (ref 4.0–10.5)
nRBC: 0 % (ref 0.0–0.2)

## 2018-08-23 LAB — BASIC METABOLIC PANEL
Anion gap: 9 (ref 5–15)
BUN: 26 mg/dL — ABNORMAL HIGH (ref 8–23)
CALCIUM: 8.2 mg/dL — AB (ref 8.9–10.3)
CO2: 27 mmol/L (ref 22–32)
Chloride: 108 mmol/L (ref 98–111)
Creatinine, Ser: 0.95 mg/dL (ref 0.61–1.24)
GFR calc Af Amer: >60 mL/min (ref >60)
GFR calc non Af Amer: >60 mL/min (ref >60)
Glucose, Bld: 165 mg/dL — ABNORMAL HIGH (ref 70–99)
Potassium: 2.9 mmol/L — ABNORMAL LOW (ref 3.5–5.1)
SODIUM: 144 mmol/L (ref 135–145)

## 2018-08-23 LAB — GLUCOSE, CAPILLARY: Glucose-Capillary: 124 mg/dL — ABNORMAL HIGH (ref 70–99)

## 2018-08-23 LAB — MAGNESIUM: Magnesium: 2.4 mg/dL (ref 1.7–2.4)

## 2018-08-23 MED ORDER — IPRATROPIUM-ALBUTEROL 0.5-2.5 (3) MG/3ML IN SOLN
3.0000 mL | RESPIRATORY_TRACT | Status: DC | PRN
  Administered 2018-08-23 (×2): 3 mL via RESPIRATORY_TRACT
  Filled 2018-08-23: qty 3

## 2018-08-23 MED ORDER — JUVEN PO PACK
1.0000 | PACK | Freq: Two times a day (BID) | ORAL | Status: DC
  Administered 2018-08-23 – 2018-08-30 (×12): 1

## 2018-08-23 MED ORDER — PRO-STAT SUGAR FREE PO LIQD
30.0000 mL | Freq: Two times a day (BID) | ORAL | Status: DC
  Administered 2018-08-23 – 2018-08-30 (×15): 30 mL

## 2018-08-23 MED ORDER — POTASSIUM CHLORIDE 10 MEQ/100ML IV SOLN
10.0000 meq | INTRAVENOUS | Status: DC
  Administered 2018-08-23 (×3): 10 meq via INTRAVENOUS
  Filled 2018-08-23 (×6): qty 100

## 2018-08-23 MED ORDER — SCOPOLAMINE 1 MG/3DAYS TD PT72
1.0000 | MEDICATED_PATCH | TRANSDERMAL | Status: DC
  Administered 2018-08-23 – 2018-08-29 (×3): 1.5 mg via TRANSDERMAL
  Filled 2018-08-23 (×3): qty 1

## 2018-08-23 MED ORDER — POTASSIUM CHLORIDE 20 MEQ/15ML (10%) PO SOLN
40.0000 meq | Freq: Two times a day (BID) | ORAL | Status: AC
  Administered 2018-08-23 (×2): 40 meq
  Filled 2018-08-23 (×2): qty 30

## 2018-08-23 MED ORDER — POTASSIUM CHLORIDE 10 MEQ/100ML IV SOLN
10.0000 meq | INTRAVENOUS | Status: AC
  Filled 2018-08-23 (×3): qty 100

## 2018-08-23 MED ORDER — ADULT MULTIVITAMIN W/MINERALS CH
1.0000 | ORAL_TABLET | Freq: Every day | ORAL | Status: DC
  Administered 2018-08-23: 1
  Filled 2018-08-23: qty 1

## 2018-08-23 MED ORDER — POTASSIUM CHLORIDE 2 MEQ/ML IV SOLN
INTRAVENOUS | Status: DC
  Filled 2018-08-23: qty 1000

## 2018-08-23 MED ORDER — OSMOLITE 1.5 CAL PO LIQD
237.0000 mL | Freq: Every day | ORAL | Status: DC
  Administered 2018-08-23 – 2018-08-30 (×34): 237 mL

## 2018-08-23 MED ORDER — FREE WATER
150.0000 mL | Freq: Every day | Status: DC
  Administered 2018-08-23 – 2018-08-25 (×9): 150 mL

## 2018-08-23 MED ORDER — PREGABALIN 75 MG PO CAPS
75.0000 mg | ORAL_CAPSULE | Freq: Two times a day (BID) | ORAL | Status: DC
  Administered 2018-08-23 – 2018-08-30 (×14): 75 mg
  Filled 2018-08-23 (×14): qty 1

## 2018-08-23 MED ORDER — PANTOPRAZOLE SODIUM 40 MG IV SOLR
40.0000 mg | INTRAVENOUS | Status: DC
  Administered 2018-08-23 – 2018-08-24 (×2): 40 mg via INTRAVENOUS
  Filled 2018-08-23 (×2): qty 40

## 2018-08-23 NOTE — Progress Notes (Signed)
Initial Nutrition Assessment  DOCUMENTATION CODES:   Not applicable  INTERVENTION:  Resume bolus regimen of Osmolite 1.5 Cal 1 can (237 mL) 5 times daily + Pro-Stat 30 mL BID per G-tube. Provides 1975 kcal, 105 grams of protein, 905 mL H2O daily from tube feeding.  Provide Juven pack BID per G-tube to promote wound healing.  Will discontinue MVI as goal TF regimen meets 100% RDIs for vitamins/minerals and Juven provides extra vitamins/minerals needed to promote wound healing.  Flush tube with 60 mL before each bolus feeding and 90 mL after each bolus feeding. Patient will receive 90 mL water with each Pro-Stat provision (30 mL flush, watered down with 30 mL, 30 mL flush). Patient will receive 180 mL water with each Juven provision (30 mL flush, mixed with 120 mL, 30 mL flush). This is a total of approximately 2195 mL daily including water in tube feeding. Patient will also receive water with medication provision.  NUTRITION DIAGNOSIS:   Inadequate oral intake related to inability to eat, dysphagia as evidenced by NPO status, other (comment)(reliance on G-tube for nutrition/hydration needs).  GOAL:   Patient will meet greater than or equal to 90% of their needs  MONITOR:   Weight trends, TF tolerance, Skin, I & O's  REASON FOR ASSESSMENT:   Malnutrition Screening Tool, Consult Assessment of nutrition requirement/status  ASSESSMENT:   68 year old male with PMHx of asthma, DM, HTN, HLD, hx CVA 05/31/2013, aphasia, dysphagia s/p G-tube placement, GERD, iron deficiency anemia who is admitted from [facility] with hematemesis, underwent EGD 2/17 which found G-tube had caused gastric outlet obstruction and duodenal ulcer now s/p repositioning by GI.   Met with patient at bedside. He is alert and able to answer questions by nodding and mouthing/whispering words. Discussed tube feed regimen found in chart and patient endorses this is what he is on. Reports tolerating well usually.  Denies any abdominal pain or nausea today. Patient is unsure if he has lost any weight but he screened positive for weight loss on admission. He reports his UBW was 165 lbs (75 kg). Per chart patient was 71.9 kg on 01/21/2018. He is currently 56.7 kg (125 lbs). If those weights are correct he has lost 15.2 kg (21% body weight) over 7 months, which would be very significant for time frame. However, it is likely difficult to measure patient's weight in setting of bed-bound status, so unsure how accurate weight history in chart is.  Patient's usual TF regimen is Osmolite 1.5 Cal 1 can bolus (237 mL) 5 times daily per tube. He also receives Pro-Stat AWC 30 mL BID. Receives free water flush of 250 mL QID between feedings (?unsure what they are flushing before and after feeds). This regimen provides 1975 kcal, 109 grams of protein, 905 mL H2O daily from tube feeds and 1000 mL H2O daily from free water flush. Patient also likely receiving some more free water before and after feeds, before and after meds, and with Pro-Stat AWC, this was just not documented.  Enteral Access: 16 Fr. Kangaroo G-tube with 20 cc balloon  Medications reviewed and include: Colace 100 mg BID, MVI daily per tube, pantoprazole, Senokot 2.5 mL BID per tube, Zosyn, potassium chloride 10 mEq x 3 today.  Labs reviewed: CBG 124, Potassium 2.9, BUN 26.  I/O: 325 mL UOP overnight  Patient does not meet criteria at this time. Suspect weight loss is partially related to muscle loss from bed-bound status.  Discussed with RN and on rounds.  Plan is to resume usual TF regimen today.  NUTRITION - FOCUSED PHYSICAL EXAM:    Most Recent Value  Orbital Region  Mild depletion  Upper Arm Region  Mild depletion  Thoracic and Lumbar Region  No depletion  Buccal Region  Mild depletion  Temple Region  Mild depletion  Clavicle Bone Region  No depletion  Clavicle and Acromion Bone Region  Mild depletion  Scapular Bone Region  No depletion  Dorsal  Hand  Mild depletion  Patellar Region  Moderate depletion  Anterior Thigh Region  Moderate depletion  Posterior Calf Region  Moderate depletion  Edema (RD Assessment)  None  Hair  Reviewed  Eyes  Reviewed  Mouth  Reviewed  Skin  Reviewed  Nails  Reviewed     Diet Order:   Diet Order            Diet NPO time specified  Diet effective now             EDUCATION NEEDS:   No education needs have been identified at this time  Skin:  Skin Assessment: Skin Integrity Issues:(unstageable to left buttocks; MSAD to perineum)  Last BM:  08/21/2018 per chart  Height:   Ht Readings from Last 1 Encounters:  05/09/18 '5\' 6"'  (1.676 m)   Weight:   Wt Readings from Last 1 Encounters:  08/23/18 56.7 kg   Ideal Body Weight:  64.5 kg(calculated w/ ht of '5\' 6"'  from previous encounter)  BMI:  Body mass index is 20.18 kg/m.  Estimated Nutritional Needs:   Kcal:  1700-1980 (30-35 kcal/kg)  Protein:  85-115 grams (1.5-2 grams/kg)  Fluid:  1.7-2 L/day (30-35 mL/kg)  Willey Blade, MS, RD, LDN Office: [phone] Pager: [phone] After Hours/Weekend Pager: [phone]

## 2018-08-23 NOTE — Progress Notes (Signed)
Inpatient Diabetes Program Recommendations  AACE/ADA: New Consensus Statement on Inpatient Glycemic Control   Target Ranges:  Prepandial:   less than 140 mg/dL      Peak postprandial:   less than 180 mg/dL (1-2 hours)      Critically ill patients:  140 - 180 mg/dL  Results for SANTINO, SCHIEFER (MRN [id number]) as of 08/23/2018 07:37  Ref. Range 08/23/2018 05:23  Glucose Latest Ref Range: 70 - 99 mg/dL 721 (H)   Results for RITHIK, SHOOTS (MRN [id number]) as of 08/23/2018 07:37  Ref. Range 08/22/2018 13:25 08/22/2018 15:32  Glucose-Capillary Latest Ref Range: 70 - 99 mg/dL 514 (H) 604 (H)   Results for IVICA, RISENHOOVER (MRN [id number]) as of 08/23/2018 07:37  Ref. Range 05/09/2018 11:27  Hemoglobin A1C Latest Ref Range: 4.0 - 5.6 % 5.7 (A)   Review of Glycemic Control  Diabetes history: DM2 Outpatient Diabetes medications: Regular 2-12 units AC&HS Current orders for Inpatient glycemic control: None  Inpatient Diabetes Program Recommendations: Insulin-Correction: Please consider ordering CBGs with Novolog correction Q4H.  Thanks, Orlando Penner, RN, MSN, CDE Diabetes Coordinator Inpatient Diabetes Program [phone] (Team Pager from 8am to 5pm)

## 2018-08-23 NOTE — Progress Notes (Signed)
Shane Antigua, MD [address], [place], [place], [place], [address] [address], [place], [place], [place], [address] Phone: [phone]  Fax: [phone]   Subjective:  No further episodes of emesis or bleeding.  Patient was extubated and is doing well post extubation.  Objective: Exam: Vital signs in last 24 hours: Vitals:   08/23/18 0953 08/23/18 1000 08/23/18 1100 08/23/18 1200  BP:  134/88 134/89 (!) 128/96  Pulse:  (!) 112 (!) 109 (!) 111  Resp:  '19 18 16  ' Temp:      TempSrc:      SpO2: 98% 96% 96% 97%  Weight:       Weight change:   Intake/Output Summary (Last 24 hours) at 08/23/2018 1453 Last data filed at 08/23/2018 1136 Gross per 24 hour  Intake 1592.1 ml  Output 1225 ml  Net 367.1 ml    General: No acute distress, AAO x3 Abd: Soft, NT/ND, No HSM.  External G-tube bumper at 4 cm mark like it was left yesterday post EGD.  G-tube being used for feeding  Skin: Warm, no rashes Neck: Supple, Trachea midline   Lab Results: Lab Results  Component Value Date   WBC 16.1 (H) 08/23/2018   HGB 11.1 (L) 08/23/2018   HCT 35.1 (L) 08/23/2018   MCV 89.3 08/23/2018   PLT 176 08/23/2018   Micro Results: Recent Results (from the past 240 hour(s))  MRSA PCR Screening     Status: Abnormal   Collection Time: 08/22/18  1:16 PM  Result Value Ref Range Status   MRSA by PCR POSITIVE (A) NEGATIVE Final    Comment:        The GeneXpert MRSA Assay (FDA approved for NASAL specimens only), is one component of a comprehensive MRSA colonization surveillance program. It is not intended to diagnose MRSA infection nor to guide or monitor treatment for MRSA infections. CRITICAL RESULT CALLED TO, READ BACK BY AND VERIFIED WITH: CALLED TO JUAN RODRIGUEZ '@[id number]'  08/22/2018 [facility] Performed at [facility], [address] [place], [place] [address]   Culture, blood (routine x 2) Call MD if unable to obtain prior to antibiotics being given     Status: None  (Preliminary result)   Collection Time: 08/22/18  1:41 PM  Result Value Ref Range Status   Specimen Description BLOOD BLOOD LEFT HAND  Final   Special Requests   Final    BOTTLES DRAWN AEROBIC AND ANAEROBIC Blood Culture adequate volume   Culture   Final    NO GROWTH < 24 HOURS Performed at [facility], [address], [place], [place] [address]    Report Status PENDING  Incomplete  Culture, blood (routine x 2) Call MD if unable to obtain prior to antibiotics being given     Status: None (Preliminary result)   Collection Time: 08/22/18  1:41 PM  Result Value Ref Range Status   Specimen Description BLOOD BLOOD LEFT WRIST  Final   Special Requests   Final    BOTTLES DRAWN AEROBIC AND ANAEROBIC Blood Culture adequate volume   Culture   Final    NO GROWTH < 24 HOURS Performed at [facility], [address] [place], [place] [address]    Report Status PENDING  Incomplete  Culture, respiratory     Status: None (Preliminary result)   Collection Time: 08/22/18  4:35 PM  Result Value Ref Range Status   Specimen Description   Final    TRACHEAL ASPIRATE Performed at [facility],  [place], [place] [address]    Special Requests   Final    NONE Performed at [facility], [place], [place] [address]    Gram Stain   Final    FEW WBC PRESENT, PREDOMINANTLY PMN MODERATE YEAST Performed at [facility], [place] [address], [place],  [address]    Culture FEW ENTEROBACTER AEROGENES  Final   Report Status PENDING  Incomplete   Studies/Results: Dg Chest 2 View  Result Date: 08/22/2018 CLINICAL DATA:  Hypoxia.  Vomiting blood. EXAM: CHEST - 2 VIEW COMPARISON:  None. FINDINGS: Low volume chest with interstitial crowding. Could not exclude mild infiltrate or atelectasis in the retrocardiac lung. No edema, effusion, or pneumothorax. Normal heart size.Apparent percutaneous gastrostomy tube. The stomach is likely  distended by gas and fluid. Hemostatic clips versus cholecystectomy clips overlap the right upper quadrant. IMPRESSION: Mild atelectasis or infiltrate in the retrocardiac lung. Electronically Signed   By: Monte Fantasia M.D.   On: 08/22/2018 10:37   Dg Chest Port 1 View  Result Date: 08/23/2018 CLINICAL DATA:  Respiratory failure.  Follow-up exam. EXAM: PORTABLE CHEST 1 VIEW COMPARISON:  08/22/2018 FINDINGS: Endotracheal tube tip now projects 2.8 cm above the Carina, well positioned. There is persistent opacity at the medial left lung base consistent with pneumonia or atelectasis. Lungs demonstrate prominent bronchovascular markings diffusely but are otherwise clear. No pleural effusion or pneumothorax. IMPRESSION: 1. Well-positioned endotracheal tube. 2. Persistent left medial lung base opacity consistent with atelectasis or pneumonia. Electronically Signed   By: Lajean Manes M.D.   On: 08/23/2018 07:42   Dg Chest Port 1 View  Result Date: 08/22/2018 CLINICAL DATA:  Status post intubation EXAM: PORTABLE CHEST 1 VIEW COMPARISON:  August 22, 2018 FINDINGS: The ET tube is been placed, terminating at the carina. Recommend withdrawing 3 cm. The cardiomediastinal silhouette is stable. No pneumothorax. Mild opacity in left base is slightly more prominent the interval. No other interval changes. IMPRESSION: 1. The ETT terminates at the carina.  Recommend withdrawing 3 cm. 2. Increased opacity in left base may represent atelectasis or infiltrate. Recommend attention on follow-up. 3. No other acute abnormalities. These results will be called to the ordering clinician or representative by the Radiologist Assistant, and communication documented in the PACS or zVision Dashboard. Electronically Signed   By: Dorise Bullion III M.D   On: 08/22/2018 16:28   Medications:  Scheduled Meds: . atorvastatin  20 mg Per Tube Daily  . budesonide (PULMICORT) nebulizer solution  0.25 mg Nebulization Q6H  . chlorhexidine  gluconate (MEDLINE KIT)  15 mL Mouth Rinse BID  . Chlorhexidine Gluconate Cloth  6 each Topical Q0600  . docusate  100 mg Per Tube BID  . feeding supplement (OSMOLITE 1.5 CAL)  237 mL Per Tube 5 X Daily  . feeding supplement (PRO-STAT SUGAR FREE 64)  30 mL Per Tube BID  . free water  150 mL Per Tube 5 X Daily  . ipratropium-albuterol  3 mL Nebulization Q6H  . mouth rinse  15 mL Mouth Rinse 10 times per day  . mupirocin ointment  1 application Nasal BID  . nutrition supplement (JUVEN)  1 packet Per Tube BID  . [START ON 08/25/2018] pantoprazole  40 mg Intravenous Q12H  . pantoprazole (PROTONIX) IV  40 mg Intravenous Q24H  . potassium chloride  40 mEq Per Tube BID  . pregabalin  75 mg Per Tube BID  . sennosides  2.5 mL Per Tube  BID   Continuous Infusions: . piperacillin-tazobactam (ZOSYN)  IV 3.375 g (08/23/18 0538)   PRN Meds:.Acetaminophen, [DISCONTINUED] ondansetron **OR** ondansetron (ZOFRAN) IV, polyethylene glycol, trolamine salicylate   Assessment: Active Problems:   Hematemesis   GIB (gastrointestinal bleeding)   Pressure injury of skin   Metabolic encephalopathy   Postpyloric ulcer   Acute respiratory failure with hypoxia (HCC)   Advanced care planning/counseling discussion   Goals of care, counseling/discussion   Palliative care by specialist    Plan: Hemoglobin is stable No further evidence of bleeding Source of his previous emesis prior to his EGD was gastric outlet obstruction from G-tube balloon obstructing the pyloric channel.  Obstruction was relieved yesterday and patient has been doing well post procedure  Continue PPI twice daily for 30 days for treatment of ulcer seen at the site of the gastric outlet obstruction  To prevent future episodes of gastric outlet obstruction, external to bumper should always be maintained lightly touching the skin, about 1 cm away from the external abdominal wall.  It is currently left at the 4 cm mark which is about 1 cm or 1  finger width away from the skin.  Since this is a surgical G-tube, I discussed the case with Dr. Rosana Hoes yesterday and he stated he would be seeing the patient.  I asked him to evaluate the tube and comment on any methods to prevent future Gastrografin obstruction or future moving of the tube.  The answer may just be putting a tape around the external bumper to keep it in place.  However, would defer to surgery to evaluate this tube as this is a surgical G-tube.  As per the patient's nurse Dr. Jamal Collin spoke to Dr. Rosana Hoes who said it is okay to use the G-tube for feedings.   Further interventions for G-tube as per surgery  GI service will sign off, please page with any questions   LOS: 1 day   Shane Antigua, MD 08/23/2018, 2:53 PM

## 2018-08-23 NOTE — Consult Note (Signed)
Consultation Note Date: 08/23/2018   Patient Name: Shane Sloan  DOB: 1951-02-16  MRN: [id number]  Age / Sex: 68 y.o., male  PCP: Maryella Shivers, MD Referring Physician: Epifanio Lesches, MD  Reason for Consultation: Establishing goals of care  HPI/Patient Profile: 68 y.o. male  with past medical history of CVA resulting in L and R hemiplegia and dysphagia, g-tube placement, dysarthria and aphasia, a-fib with RVR, urinary retention, admitted on 08/22/2018 with hematemesis. He subsequently aspirated and developed aspiration pneumonia. He was intubated for EGD and has been extubated to room air. EGD revealed old blood likely from ulcer created by balloon from g-tube. Surgery consulted- no surgical invention needed.  Palliative medicine consulted for "long-term poor prognosis".  Clinical Assessment and Goals of Care: I reviewed patient's chart and met with patient at bedside.  He was able to communicate with nodding, pointing, and some soft vocalization. Able to tell me he was in the hospital due to "bleeding". I further discussed with him his pneumonia, and finding of ulcer from endoscopy.  He acknowledged he was in pain, noting pain in both feet, described pain as burning. Denied pain anywhere else including his stomach. We discussed his feeding tube and artificial feeding- he wishes to continue the feeding. I asked him about his wishes regarding ongoing artificial feeding, ventilation, CPR. When asked if he would want CPR or to be put on a ventilator in the event that his heart stopped and he stopped breathing, he became tearful and  nodded "No". I clarified with him, and asked him if he understood this meant he would die, and said, "Yes, I would pass on".  I asked him if he wanted to continue antibiotics for his pneumonia and he said, "Yes". He wishes to continue all treatments to keep him alive, but if he  were to die, he does not want resuscitation.  He says, "I pray to live". And he requested prayer.    Primary Decision Maker PATIENT    SUMMARY OF RECOMMENDATIONS -Patient is decisional and DNR status would be appropriate, however, I have placed a call to his daughter and will discuss with her before placing order  -Increase Lyrica to 4m BID for bilateral foot pain -Chaplain consult for prayer   Code Status/Advance Care Planning:  Full code  Palliative Prophylaxis:   Frequent Pain Assessment  Additional Recommendations (Limitations, Scope, Preferences):  Full Scope Treatment  Psycho-social/Spiritual:   Desire for further Chaplaincy support:yes  Prognosis:    Unable to determine  Discharge Planning: [place]for rehab with Palliative care service follow-up  Primary Diagnoses: Present on Admission: . Hematemesis . GIB (gastrointestinal bleeding) . Metabolic encephalopathy   I have reviewed the medical record, interviewed the patient and family, and examined the patient. The following aspects are pertinent.  Past Medical History:  Diagnosis Date  . Allergy   . Asthma   . [place] mellitus [place] complication ([place]   . Dysphagia   . GERD (gastroesophageal reflux disease)   . H/O blood clots   .  Hyperlipidemia   . Hypertension   . Iron deficiency anemia   . Metabolic encephalopathy   . Prostate disease   . Stroke ([place]) 05/31/2013  . Urinary incontinence    Social History   Socioeconomic History  . Marital status: Widowed    Spouse name: Not on file  . Number of children: Not on file  . Years of education: Not on file  . Highest education level: Not on file  Occupational History  . Not on file  Social Needs  . Financial resource strain: Not on file  . Food insecurity:    Worry: Not on file    Inability: Not on file  . Transportation needs:    Medical: Not on file    Non-medical: Not on file  Tobacco Use  . Smoking status: Never  Smoker  . Smokeless tobacco: Never Used  Substance and Sexual Activity  . Alcohol use: No  . Drug use: No  . Sexual activity: Not on file  Lifestyle  . Physical activity:    Days per week: Not on file    Minutes per session: Not on file  . Stress: Not on file  Relationships  . Social connections:    Talks on phone: Not on file    Gets together: Not on file    Attends religious service: Not on file    Active member of club or organization: Not on file    Attends meetings of clubs or organizations: Not on file    Relationship status: Not on file  Other Topics Concern  . Not on file  Social History Narrative  . Not on file   History reviewed. No pertinent family history. Scheduled Meds: . atorvastatin  20 mg Per Tube Daily  . budesonide (PULMICORT) nebulizer solution  0.25 mg Nebulization Q6H  . chlorhexidine gluconate (MEDLINE KIT)  15 mL Mouth Rinse BID  . Chlorhexidine Gluconate Cloth  6 each Topical Q0600  . docusate  100 mg Per Tube BID  . feeding supplement (OSMOLITE 1.5 CAL)  237 mL Per Tube 5 X Daily  . feeding supplement (PRO-STAT SUGAR FREE 64)  30 mL Per Tube BID  . free water  150 mL Per Tube 5 X Daily  . ipratropium-albuterol  3 mL Nebulization Q6H  . mouth rinse  15 mL Mouth Rinse 10 times per day  . mupirocin ointment  1 application Nasal BID  . nutrition supplement (JUVEN)  1 packet Per Tube BID  . [START ON 08/25/2018] pantoprazole  40 mg Intravenous Q12H  . pantoprazole (PROTONIX) IV  40 mg Intravenous Q24H  . potassium chloride  40 mEq Per Tube BID  . pregabalin  50 mg Per Tube BID  . sennosides  2.5 mL Per Tube BID   Continuous Infusions: . piperacillin-tazobactam (ZOSYN)  IV 3.375 g (08/23/18 0538)  . potassium chloride     PRN Meds:.Acetaminophen, [DISCONTINUED] ondansetron **OR** ondansetron (ZOFRAN) IV, polyethylene glycol, trolamine salicylate Medications Prior to Admission:  Prior to Admission medications   Medication Sig Start Date End Date  Taking? Authorizing Provider  Acetaminophen 650 MG/20.3ML SUSP Place 650 mg into feeding tube every 8 (eight) hours as needed for fever.    Yes [provider]  atorvastatin (LIPITOR) 20 MG tablet Take 1 tablet (20 mg total) by mouth daily. Patient taking differently: Place 20 mg into feeding tube daily.  05/09/18 08/22/18 Yes Mikey College, NP  docusate (COLACE) 50 MG/5ML liquid Place 100 mg into feeding tube 2 (two)  times daily.   Yes [provider]  insulin regular (NOVOLIN R,HUMULIN R) 100 units/mL injection Inject 2-12 Units into the skin 4 (four) times daily -  before meals and at bedtime. slinding scale: 150-200=2; 201-250=4; 251-300=6; 301-350=8; 351-400=10; 401+=12 notify MD if BS is greater than 400   Yes [provider]  nystatin (MYCOSTATIN) 100000 UNIT/ML suspension Take 5 mLs by mouth 4 (four) times daily. For thrush for 14 days, use pink sponge to apply to tongue   Yes [provider]  pantoprazole sodium (PROTONIX) 40 mg/20 mL PACK 40 mg by Gastric Tube route 2 (two) times daily. 05/27/18 08/22/18 Yes [provider]  pregabalin (LYRICA) 50 MG capsule Place 50 mg into feeding tube 2 (two) times daily. 05/27/18  Yes [provider]  sennosides (SENOKOT) 8.8 MG/5ML syrup Place 2.5 mLs into feeding tube 2 (two) times daily.   Yes [provider]  terazosin (HYTRIN) 1 MG capsule Place 1 mg into feeding tube at bedtime. 06/09/18  Yes [provider]  trolamine salicylate (ASPERCREME) 10 % cream Apply 1 application topically daily as needed (feet pain).   Yes [provider]   Allergies  Allergen Reactions  . Lisinopril Hives and Swelling   Review of Systems  Physical Exam Vitals signs and nursing note reviewed.  Constitutional:      Appearance: He is ill-appearing.  Cardiovascular:     Rate and Rhythm: Tachycardia present.     Pulses: Normal pulses.  Pulmonary:     Breath sounds: Rales present.   Abdominal:     General: Abdomen is flat.     Palpations: Abdomen is soft.  Skin:    General: Skin is warm and dry.  Neurological:     Mental Status: He is alert and oriented to person, place, and time.  Psychiatric:        Thought Content: Thought content normal.     Vital Signs: BP 134/89   Pulse (!) 109   Temp 99.2 F (37.3 C) (Axillary)   Resp 18   Wt 56.7 kg   SpO2 96%   BMI 20.18 kg/m  Pain Scale: 0-10   Pain Score: 0-No pain   SpO2: SpO2: 96 % O2 Device:SpO2: 96 % O2 Flow Rate: .O2 Flow Rate (L/min): 2 L/min  IO: Intake/output summary:   Intake/Output Summary (Last 24 hours) at 08/23/2018 1210 Last data filed at 08/23/2018 1136 Gross per 24 hour  Intake 1592.1 ml  Output 1225 ml  Net 367.1 ml    LBM: Last BM Date: 08/21/18 Baseline Weight: Weight: 63 kg Most recent weight: Weight: 56.7 kg     Palliative Assessment/Data: PPS: 20%   Flowsheet Rows     Most Recent Value  Intake Tab  Referral Department  Hospitalist  Unit at Time of Referral  ER  Palliative Care Primary Diagnosis  Sepsis/Infectious Disease  Date Notified  08/22/18  Palliative Care Type  New Palliative care  Reason for referral  Clarify Goals of Care  Date of Admission  08/22/18  # of days IP prior to Palliative referral  0  Clinical Assessment  Psychosocial & Spiritual Assessment  Palliative Care Outcomes      Thank you for this consult. Palliative medicine will continue to follow and assist as needed.   Time In: 1115 Time Out: 1230 Time Total: 75 minutes Greater than 50%  of this time was spent counseling and coordinating care related to the above assessment and plan.  Signed by: Mariana Kaufman,  AGNP-C Palliative Medicine    Please contact Palliative Medicine Team phone at [phone] for questions and concerns.  For individual provider: See Shea Evans

## 2018-08-23 NOTE — Progress Notes (Signed)
   08/23/18 1400  Clinical Encounter Type  Visited With Patient  Visit Type Initial  Referral From Nurse  Ch received an OR requesting a prayer. Pt was asleep. Follow up needed.

## 2018-08-23 NOTE — Consult Note (Signed)
[facility] SURGICAL CONSULTATION NOTE (initial) - cpt: 50569    HISTORY OF PRESENT ILLNESS (HPI):  68 y.o. male admitted medicine service currently in the ICU after being brought to [facility] ED on 02/17 from his nursing facility with concerns over possible hematemesis. Patient has a history of devastating CVA and is aphasic which limited history. There was a witnessed episode of hematemesis in the ED and GI (Dr Maximino Greenland) was called. Patient's hemoglobin at the time was 14. He was admitted to ICU, intubated, and underwent EGD which revealed the balloon from his chronic PEG tube had caused gastric outlet obstruction and may have attributed to his hematemesis. The tube was pulled back and balloon was repositioned during the procedure by Dr Maximino Greenland.   Surgery is consulted by gastroenterology physician Dr. Melodie Bouillon, MD in this context for evaluation and management of the patient's chronic PEG tube and further management options regarding positioning/securing tube to prevent this from occurring again.     PAST MEDICAL HISTORY (PMH):  Past Medical History:  Diagnosis Date  . Allergy   . Asthma   . Diabetes mellitus without complication (HCC)   . Dysphagia   . GERD (gastroesophageal reflux disease)   . H/O blood clots   . Hyperlipidemia   . Hypertension   . Iron deficiency anemia   . Metabolic encephalopathy   . Prostate disease   . Stroke (HCC) 05/31/2013  . Urinary incontinence      PAST SURGICAL HISTORY (PSH):  Past Surgical History:  Procedure Laterality Date  . ESOPHAGOGASTRODUODENOSCOPY N/A 08/22/2018   Procedure: ESOPHAGOGASTRODUODENOSCOPY (EGD);  Surgeon: Pasty Spillers, MD;  [facility];  Service: Endoscopy;  Laterality: N/A;  . GASTROSTOMY TUBE PLACEMENT       MEDICATIONS:  Prior to Admission medications   Medication Sig Start Date End Date Taking? Authorizing Provider  Acetaminophen 650 MG/20.3ML SUSP Place 650 mg into feeding  tube every 8 (eight) hours as needed for fever.    Yes [provider]  atorvastatin (LIPITOR) 20 MG tablet Take 1 tablet (20 mg total) by mouth daily. Patient taking differently: Place 20 mg into feeding tube daily.  05/09/18 08/22/18 Yes Galen Manila, NP  docusate (COLACE) 50 MG/5ML liquid Place 100 mg into feeding tube 2 (two) times daily.   Yes [provider]  insulin regular (NOVOLIN R,HUMULIN R) 100 units/mL injection Inject 2-12 Units into the skin 4 (four) times daily -  before meals and at bedtime. slinding scale: 150-200=2; 201-250=4; 251-300=6; 301-350=8; 351-400=10; 401+=12 notify MD if BS is greater than 400   Yes [provider]  nystatin (MYCOSTATIN) 100000 UNIT/ML suspension Take 5 mLs by mouth 4 (four) times daily. For thrush for 14 days, use pink sponge to apply to tongue   Yes [provider]  pantoprazole sodium (PROTONIX) 40 mg/20 mL PACK 40 mg by Gastric Tube route 2 (two) times daily. 05/27/18 08/22/18 Yes [provider]  pregabalin (LYRICA) 50 MG capsule Place 50 mg into feeding tube 2 (two) times daily. 05/27/18  Yes [provider]  sennosides (SENOKOT) 8.8 MG/5ML syrup Place 2.5 mLs into feeding tube 2 (two) times daily.   Yes [provider]  terazosin (HYTRIN) 1 MG capsule Place 1 mg into feeding tube at bedtime. 06/09/18  Yes [provider]  trolamine salicylate (ASPERCREME) 10 % cream Apply 1 application topically daily as needed (feet pain).   Yes [provider]     ALLERGIES:  Allergies  Allergen Reactions  .  Lisinopril Hives and Swelling     SOCIAL HISTORY:  Social History   Socioeconomic History  . Marital status: Widowed    Spouse name: Not on file  . Number of children: Not on file  . Years of education: Not on file  . Highest education level: Not on file  Occupational History  . Not on file  Social Needs  . Financial resource strain: Not on file  . Food  insecurity:    Worry: Not on file    Inability: Not on file  . Transportation needs:    Medical: Not on file    Non-medical: Not on file  Tobacco Use  . Smoking status: Never Smoker  . Smokeless tobacco: Never Used  Substance and Sexual Activity  . Alcohol use: No  . Drug use: No  . Sexual activity: Not on file  Lifestyle  . Physical activity:    Days per week: Not on file    Minutes per session: Not on file  . Stress: Not on file  Relationships  . Social connections:    Talks on phone: Not on file    Gets together: Not on file    Attends religious service: Not on file    Active member of club or organization: Not on file    Attends meetings of clubs or organizations: Not on file    Relationship status: Not on file  . Intimate partner violence:    Fear of current or ex partner: Not on file    Emotionally abused: Not on file    Physically abused: Not on file    Forced sexual activity: Not on file  Other Topics Concern  . Not on file  Social History Narrative  . Not on file     FAMILY HISTORY:  History reviewed. No pertinent family history.    REVIEW OF SYSTEMS:  Review of Systems  Unable to perform ROS: Intubated  Gastrointestinal: Positive for vomiting (Hematemesis).    VITAL SIGNS:  Temp:  [97.8 F (36.6 C)-99.4 F (37.4 C)] 97.8 F (36.6 C) (02/18 0400) Pulse Rate:  [84-121] 110 (02/18 0900) Resp:  [15-31] 17 (02/18 0900) BP: (89-162)/(72-109) 123/92 (02/18 0900) SpO2:  [84 %-100 %] 100 % (02/18 0900) FiO2 (%):  [35 %-100 %] 35 % (02/18 0730) Weight:  [56.7 kg] 56.7 kg (02/18 0400)       Weight: 56.7 kg     INTAKE/OUTPUT:  This shift: No intake/output data recorded.  Last 2 shifts: @IOLAST2SHIFTS @   PHYSICAL EXAM:  Physical Exam Constitutional:      General: He is not in acute distress.    Appearance: Normal appearance. He is not ill-appearing.     Interventions: He is intubated.  HENT:     Head: Normocephalic.  Cardiovascular:     Rate and  Rhythm: Regular rhythm. Tachycardia present.  Pulmonary:     Effort: He is intubated.  Abdominal:     General: Abdomen is flat.     Tenderness: There is no abdominal tenderness. There is no guarding or rebound.    Skin:    General: Skin is warm and dry.     Findings: No erythema.       Labs:  CBC Latest Ref Rng & Units 08/23/2018 08/22/2018 08/22/2018  WBC 4.0 - 10.5 K/uL 16.1(H) - -  Hemoglobin 13.0 - 17.0 g/dL 11.1(L) 12.9(L) 12.6(L)  Hematocrit 39.0 - 52.0 % 35.1(L) 41.4 38.9(L)  Platelets 150 - 400 K/uL 176 - -   CMP  Latest Ref Rng & Units 08/23/2018 08/22/2018 05/09/2018  Glucose 70 - 99 mg/dL 761(H) 183(U) 373(H)  BUN 8 - 23 mg/dL 78(X) 14 11  Creatinine 0.61 - 1.24 mg/dL 7.84 7.84 1.28  Sodium 135 - 145 mmol/L 144 140 139  Potassium 3.5 - 5.1 mmol/L 2.9(L) 3.8 3.8  Chloride 98 - 111 mmol/L 108 98 103  CO2 22 - 32 mmol/L 27 31 29   Calcium 8.9 - 10.3 mg/dL 8.2(L) 9.2 9.4  Total Protein 6.5 - 8.1 g/dL - 7.9 6.8  Total Bilirubin 0.3 - 1.2 mg/dL - 1.0 0.8  Alkaline Phos 38 - 126 U/L - 74 -  AST 15 - 41 U/L - 32 16  ALT 0 - 44 U/L - 33 15     Assessment/Plan: (ICD-10's: K92.0) 68 y.o. male with improved hematemesis likely secondary to duodenal ulcer and gastric outlet obstruction cause by patient's PEG tube balloon which was repositioned by Dr Maximino Greenland during EGD yesterday, complicated by pertinent comorbidities including DM, HLD, HTN, iron deficiency anemia, and history of CVA resulting in aphasia/bed bound/chronic PEG dependence.   - Okay to use PEG Tube for feeding  - Can secure PEG Tube over gause dressing or how primary team/GI see fit  - If concern for tube being loose/dislodged, PEG can be replaced with new tube that better fits the tract   - No indication for emergent surgical intervention  - medical management per primary team  - general surgery will sign off, please re-consult if new issue/questions arise  All of the above findings and recommendations were  discussed with ICU physician Dr Bard Herbert, MD.   Thank you for the opportunity to participate in this patient's care.   -- Lynden Oxford, PA-C [facility] 08/23/2018, 9:30 AM [phone] M-F: 7am - 4pm

## 2018-08-23 NOTE — Progress Notes (Signed)
Chaplain offered prayer following palliative care conversation. Difficult to understand yet appeared aware of prayer. Chaplain will refer to on-call chaplain for prayer to be offered on 2.19.2020.    08/23/18 1900  Clinical Encounter Type  Visited With Patient  Visit Type Follow-up  Referral From Nurse  Spiritual Encounters  Spiritual Needs Prayer

## 2018-08-23 NOTE — Consult Note (Signed)
Pharmacy Antibiotic Note  Shane Sloan is a 68 y.o. male admitted on 08/22/2018 with pneumonia. He has a h/o CVA with chronic aphasia/PEG tube placement/bedbound state, presenting from local nursing home with vomiting of blood, hypoxia with O2 saturation in the 80s,  lactic acid 2.9, CXR noted for atelectasis versus pneumonia, large bloody emesis noted in the ED which is dark in color.   Plan: Zosyn EI 3.375g IV Q8hr.   Weight: 125 lb (56.7 kg)  Temp (24hrs), Avg:98.6 F (37 C), Min:97.8 F (36.6 C), Max:99.4 F (37.4 C)  Recent Labs  Lab 08/22/18 0853 08/23/18 0523  WBC 10.1 16.1*  CREATININE 0.76 0.95  LATICACIDVEN 2.9*  --     Estimated Creatinine Clearance: 61.3 mL/min (by C-G formula based on SCr of 0.95 mg/dL).    Allergies  Allergen Reactions  . Lisinopril Hives and Swelling    Antimicrobials this admission: vancomycin 2/17 x 1 Zosyn 2/17 >>  Microbiology results: 2/17 BCx: pending 2/17 Tracheal Aspirate: pending  2/18 MRSA PCR: positive   Thank you for allowing pharmacy to be a part of this patient's care.  Clovia Cuff, PharmD, BCPS 08/23/2018 9:52 AM

## 2018-08-23 NOTE — Plan of Care (Signed)
Extubated to 2 lpm O2 [place]. Tolerating well

## 2018-08-23 NOTE — Progress Notes (Signed)
Admitted yesterday for [facility] [place] at [facility]   PATIENT NAME: Shane Sloan    MR#:  [id number]  DATE OF BIRTH:  Dec 12, 1950  SUBJECTIVE: Admitted yesterday for GI bleed, patient developed respiratory distress, intubated prior to EGD.  Currently intubated, on Protonix drip.  CHIEF COMPLAINT:   Chief Complaint  Patient presents with  . Emesis    REVIEW OF SYSTEMS:   Review of Systems  Unable to perform ROS: Intubated     DRUG ALLERGIES:   Allergies  Allergen Reactions  . Lisinopril Hives and Swelling    VITALS:  Blood pressure 134/89, pulse (!) 109, temperature 99.2 F (37.3 C), temperature source Axillary, resp. rate 18, weight 56.7 kg, SpO2 96 %.  PHYSICAL EXAMINATION:  GENERAL:  68 y.o.-year-old patient lying in the bed, currently intubated morning.Marland Kitchen  EYES: Pupils equal, round, reactive to light a. No scleral icterus. Extraocular muscles intact.  HEENT: Head atraumatic, normocephalic.  Orally intubated.  NECK:  Supple, no jugular venous distention. No thyroid enlargement, no tenderness.  LUNGS: Normal breath sounds bilaterally, no wheezing, rales,rhonchi or crepitation. No use of accessory muscles of respiration.  CARDIOVASCULAR: S1, S2 normal. No murmurs, rubs, or gallops.  ABDOMEN: Soft, nontender, nondistended. Bowel sounds present. No organomegaly or mass.  PEG tube site is not infected. EXTREMITIES: No pedal edema, cyanosis, or clubbing.  NEUROLOGIC: Currently intubated PSYCHIATRIC: The patient is alert and oriented x 3.  SKIN: No obvious rash, lesion, or ulcer.    LABORATORY PANEL:   CBC Recent Labs  Lab 08/23/18 0523  WBC 16.1*  HGB 11.1*  HCT 35.1*  PLT 176   ------------------------------------------------------------------------------------------------------------------  Chemistries  Recent Labs  Lab 08/22/18 0853 08/23/18 0523  NA 140 144  K 3.8 2.9*  CL 98 108  CO2 31 27  GLUCOSE 182* 165*  BUN 14  26*  CREATININE 0.76 0.95  CALCIUM 9.2 8.2*  MG  --  2.4  AST 32  --   ALT 33  --   ALKPHOS 74  --   BILITOT 1.0  --    ------------------------------------------------------------------------------------------------------------------  Cardiac Enzymes No results for input(s): TROPONINI in the last 168 hours. ------------------------------------------------------------------------------------------------------------------  RADIOLOGY:  Dg Chest 2 View  Result Date: 08/22/2018 CLINICAL DATA:  Hypoxia.  Vomiting blood. EXAM: CHEST - 2 VIEW COMPARISON:  None. FINDINGS: Low volume chest with interstitial crowding. Could not exclude mild infiltrate or atelectasis in the retrocardiac lung. No edema, effusion, or pneumothorax. Normal heart size.Apparent percutaneous gastrostomy tube. The stomach is likely distended by gas and fluid. Hemostatic clips versus cholecystectomy clips overlap the right upper quadrant. IMPRESSION: Mild atelectasis or infiltrate in the retrocardiac lung. Electronically Signed   By: Marnee Spring M.D.   On: 08/22/2018 10:37   Dg Chest Port 1 View  Result Date: 08/23/2018 CLINICAL DATA:  Respiratory failure.  Follow-up exam. EXAM: PORTABLE CHEST 1 VIEW COMPARISON:  08/22/2018 FINDINGS: Endotracheal tube tip now projects 2.8 cm above the Carina, well positioned. There is persistent opacity at the medial left lung base consistent with pneumonia or atelectasis. Lungs demonstrate prominent bronchovascular markings diffusely but are otherwise clear. No pleural effusion or pneumothorax. IMPRESSION: 1. Well-positioned endotracheal tube. 2. Persistent left medial lung base opacity consistent with atelectasis or pneumonia. Electronically Signed   By: Amie Portland M.D.   On: 08/23/2018 07:42   Dg Chest Port 1 View  Result Date: 08/22/2018 CLINICAL DATA:  Status post intubation EXAM: PORTABLE CHEST 1 VIEW COMPARISON:  August 22, 2018  FINDINGS: The ET tube is been placed, terminating  at the carina. Recommend withdrawing 3 cm. The cardiomediastinal silhouette is stable. No pneumothorax. Mild opacity in left base is slightly more prominent the interval. No other interval changes. IMPRESSION: 1. The ETT terminates at the carina.  Recommend withdrawing 3 cm. 2. Increased opacity in left base may represent atelectasis or infiltrate. Recommend attention on follow-up. 3. No other acute abnormalities. These results will be called to the ordering clinician or representative by the Radiologist Assistant, and communication documented in the PACS or zVision Dashboard. Electronically Signed   By: Gerome Sam III M.D   On: 08/22/2018 16:28    EKG:   Orders placed or performed during the hospital encounter of 08/22/18  . ED EKG  . ED EKG  . EKG 12-Lead  . EKG 12-Lead    ASSESSMENT AND PLAN:   68 year old male with history of previous stroke aphasia brought in from nursing home because of hematemesis, patient developed respiratory distress prior to EGD so intubated, transferred to ICU after EGD.  #1. acute respiratory failure, likely aspiration event currently on full vent support, intensivist following, likely wean to extubate today.  Continue IV antibiotics with Zosyn. 2.  GI bleeding, patient had developed upper GI bleed, patient had emergent EGD yesterday which showed nonbleeding duodenal ulcer, patient is hemodynamically stable with stable hemoglobin, now on IV PPI, 3. dysphagia with inadequate oral intake, seen by dietitian, will start tube feeding with free water flushes. 4 diabetes mellitus type 5.  Hypertension 6.  Hyperlipidemia History of stroke in 2014 with aphasia, dysphasia status post G-tube placement #7 gastric outlet obstruction, duodenal ulcer, G-tube repositioning by gastroenterology yesterday.  #8. hypokalemia: Replace the potassium 9.  Continue contact precautions because of MRSA All the records are reviewed and case discussed with Care Management/Social  Workerr. Management plans discussed with the patient, family and they are in agreement.  CODE STATUS: Full code  TOTAL TIME TAKING CARE OF THIS PATIENT: 38 minutes.   POSSIBLE D/C IN 1-2 DAYS, DEPENDING ON CLINICAL CONDITION.   Katha Hamming M.D on 08/23/2018 at 11:55 AM  Between 7am to 6pm - Pager - [phone]  After 6pm go to [email or web address] - password [email or web address] [facility]  [facility]  Office  [phone]  CC: Primary care physician; Charlott Rakes, MD   Note: This dictation was prepared with Dragon dictation along with smaller phrase technology. Any transcriptional errors that result from this process are unintentional.

## 2018-08-23 NOTE — Progress Notes (Signed)
PULMONARY/CCM CONSULT NOTE  Requesting MD/Service: Salary Date of initial consultation: 02/17 Reason for consultation: elective intubation prior to EGD  PT PROFILE: 68 y.o. male with extensive PMH, most significantly devastating stroke 5 years prior to admission, chronic Foley catheter, chronic G-tube presented to [facility] ED with hematemesis (frothy brown) and subsequently developed respiratory distress.  Required intubation prior to EGD  MAJOR EVENTS/TEST RESULTS: 02/17 admission as documented above 02/17 EGD: G tube, with the balloon obstructing the pyloric channel, present. One non-bleeding duodenal ulcer with a clean ulcer base (Forrest Class III). Blood in the second portion of the duodenum  INDWELLING DEVICES: ETT 02/17 >> 02/18   MICRO DATA: MRSA PCR 02/17 >> POS Resp 0217 >>  Blood 02/17 >>   ANTIMICROBIALS:  Vanc 02/17 >> 02/17 Pip-tazo 02/17 >>    SUBJ: Passed SBT and extubated under my supervision this morning.  Tolerating well with no distress on room air  Vitals:   08/23/18 0953 08/23/18 1000 08/23/18 1100 08/23/18 1200  BP:  134/88 134/89 (!) 128/96  Pulse:  (!) 112 (!) 109 (!) 111  Resp:  19 18 16   Temp:      TempSrc:      SpO2: 98% 96% 96% 97%  Weight:       Vent Mode: PRVC FiO2 (%):  [35 %-100 %] 35 % Set Rate:  [15 bmp] 15 bmp Vt Set:  [400 mL] 400 mL PEEP:  [5 cmH20] 5 cmH20   EXAM:   Gen: NAD HEENT: NCAT, sclerae white, oropharynx normal Neck: No LAN, no JVD noted Lungs: No adventitious sounds noted anteriorly Cardiovascular: Regular, normal rate, no M noted Abdomen: Soft, NT, +BS Ext: no C/C/E Neuro: CNs intact, moves all extremities, aphasic Skin: No lesions noted   DATA:   BMP Latest Ref Rng & Units 08/23/2018 08/22/2018 05/09/2018  Glucose 70 - 99 mg/dL 850(Y) 774(J) 287(O)  BUN 8 - 23 mg/dL 67(E) 14 11  Creatinine 0.61 - 1.24 mg/dL 7.20 9.47 0.96  BUN/Creat Ratio 6 - 22 (calc) - - NOT APPLICABLE  Sodium 135 - 145 mmol/L 144 140 139   Potassium 3.5 - 5.1 mmol/L 2.9(L) 3.8 3.8  Chloride 98 - 111 mmol/L 108 98 103  CO2 22 - 32 mmol/L 27 31 29   Calcium 8.9 - 10.3 mg/dL 8.2(L) 9.2 9.4    CBC Latest Ref Rng & Units 08/23/2018 08/22/2018 08/22/2018  WBC 4.0 - 10.5 K/uL 16.1(H) - -  Hemoglobin 13.0 - 17.0 g/dL 11.1(L) 12.9(L) 12.6(L)  Hematocrit 39.0 - 52.0 % 35.1(L) 41.4 38.9(L)  Platelets 150 - 400 K/uL 176 - -    CXR: Improved LLL aeration  I have personally reviewed all chest radiographs reported above including CXRs and CT chest unless otherwise indicated  IMPRESSION:   1) acute respiratory failure with hypoxemia 2) LLL aspiration pneumonia 3) hematemesis due to malpositioned G tube cuff obstructing pyloric channel 4) history of CVA, chronic G-tube, chronic Foley 5) history of asthma, bronchospasm resolved   PLAN:  Extubated under my supervision this morning Monitor in ICU post extubation Continue nebulized steroids and bronchodilators ordered Continue ICU hemodynamic monitoring Advance diet per G-tube Advance activity as tolerated Monitor temp, WBC count Micro and abx as above DVT px: SCDs Monitor CBC intermittently Transfuse per usual guidelines  Billy Fischer, MD PCCM service Mobile [phone] Pager [phone] 08/23/2018 12:28 PM

## 2018-08-24 DIAGNOSIS — J189 Pneumonia, unspecified organism: Secondary | ICD-10-CM

## 2018-08-24 LAB — BASIC METABOLIC PANEL
Anion gap: 3 — ABNORMAL LOW (ref 5–15)
BUN: 26 mg/dL — ABNORMAL HIGH (ref 8–23)
CO2: 31 mmol/L (ref 22–32)
CREATININE: 0.71 mg/dL (ref 0.61–1.24)
Calcium: 8.4 mg/dL — ABNORMAL LOW (ref 8.9–10.3)
Chloride: 111 mmol/L (ref 98–111)
GFR calc Af Amer: >60 mL/min (ref >60)
GFR calc non Af Amer: >60 mL/min (ref >60)
Glucose, Bld: 130 mg/dL — ABNORMAL HIGH (ref 70–99)
Potassium: 3.9 mmol/L (ref 3.5–5.1)
Sodium: 145 mmol/L (ref 135–145)

## 2018-08-24 LAB — TYPE AND SCREEN
ABO/RH(D): B POS
ANTIBODY SCREEN: NEGATIVE
UNIT DIVISION: 0
Unit division: 0

## 2018-08-24 LAB — BPAM RBC
Blood Product Expiration Date: 202003112359
Blood Product Expiration Date: 202003132359
UNIT TYPE AND RH: 7300
Unit Type and Rh: 7300

## 2018-08-24 LAB — CBC
HCT: 32.6 % — ABNORMAL LOW (ref 39.0–52.0)
Hemoglobin: 10.1 g/dL — ABNORMAL LOW (ref 13.0–17.0)
MCH: 28.5 pg (ref 26.0–34.0)
MCHC: 31 g/dL (ref 30.0–36.0)
MCV: 92.1 fL (ref 80.0–100.0)
Platelets: 171 10*3/uL (ref 150–400)
RBC: 3.54 MIL/uL — ABNORMAL LOW (ref 4.22–5.81)
RDW: 14.2 % (ref 11.5–15.5)
WBC: 12.4 10*3/uL — ABNORMAL HIGH (ref 4.0–10.5)
nRBC: 0 % (ref 0.0–0.2)

## 2018-08-24 LAB — PREPARE RBC (CROSSMATCH)

## 2018-08-24 MED ORDER — PANTOPRAZOLE SODIUM 40 MG PO PACK
40.0000 mg | PACK | Freq: Every day | ORAL | Status: DC
  Administered 2018-08-24 – 2018-08-30 (×7): 40 mg
  Filled 2018-08-24 (×7): qty 20

## 2018-08-24 MED ORDER — GLYCOPYRROLATE 0.2 MG/ML IJ SOLN
0.2000 mg | INTRAMUSCULAR | Status: DC | PRN
  Administered 2018-08-24 – 2018-08-27 (×3): 0.2 mg via INTRAVENOUS
  Filled 2018-08-24 (×3): qty 1

## 2018-08-24 MED ORDER — ORAL CARE MOUTH RINSE
15.0000 mL | Freq: Two times a day (BID) | OROMUCOSAL | Status: DC
  Administered 2018-08-25 – 2018-08-30 (×11): 15 mL via OROMUCOSAL

## 2018-08-24 MED ORDER — CHLORHEXIDINE GLUCONATE 0.12 % MT SOLN: 15.0000 mL | Freq: Two times a day (BID) | OROMUCOSAL | Status: DC

## 2018-08-24 MED ORDER — MORPHINE SULFATE (CONCENTRATE) 10 MG/0.5ML PO SOLN
2.5000 mg | ORAL | Status: DC | PRN
  Administered 2018-08-24 – 2018-08-29 (×5): 2.6 mg via ORAL
  Filled 2018-08-24 (×5): qty 1

## 2018-08-24 NOTE — Progress Notes (Signed)
Daily Progress Note   Patient Name: Shane Sloan       Date: 08/24/2018 DOB: 1950-10-23  Age: 68 y.o. MRN#: [id number] Attending Physician: Epifanio Lesches, MD Primary Care Physician: Maryella Shivers, MD Admit Date: 08/22/2018  Reason for Consultation/Follow-up: Establishing goals of care  Subjective: I was able to reach patient's daughter this morning. Related my discussion with patient had with patient yesterday regarding his code status and GOC. She agreed and stated patient had previously told her he did not want resuscitation. However, he has a daughter coming in March from [place] of country and he is hoping to see her and therefore wishes to continue other life prolonging measures in the hope of seeing her.  I evaluated patient at bedside. Per RN report he has been pointing at his mouth and pointing to "tired" on language board this morning.  Evidently, he required a great amount of suctioning last night.  Respiratory was at bedside about to suction him again. I asked him if he wanted suction again and he nodded, "No" and said "fatigued" and spelled Fati on board.... I pointed to tired and  He nodded yes. I asked him if he was tired of being suctioned and he stated yes.  I relayed to him my discussion with Delma Freeze and he was pleased. Delma Freeze stated she will be her later today.   ROS  Length of Stay: 2  Current Medications: Scheduled Meds:  . atorvastatin  20 mg Per Tube Daily  . budesonide (PULMICORT) nebulizer solution  0.25 mg Nebulization Q6H  . chlorhexidine gluconate (MEDLINE KIT)  15 mL Mouth Rinse BID  . Chlorhexidine Gluconate Cloth  6 each Topical Q0600  . docusate  100 mg Per Tube BID  . feeding supplement (OSMOLITE 1.5 CAL)  237 mL Per Tube 5 X Daily  . feeding  supplement (PRO-STAT SUGAR FREE 64)  30 mL Per Tube BID  . free water  150 mL Per Tube 5 X Daily  . ipratropium-albuterol  3 mL Nebulization Q6H  . mupirocin ointment  1 application Nasal BID  . nutrition supplement (JUVEN)  1 packet Per Tube BID  . [START ON 08/25/2018] pantoprazole  40 mg Intravenous Q12H  . pantoprazole (PROTONIX) IV  40 mg Intravenous Q24H  . pregabalin  75 mg Per Tube BID  . scopolamine  1 patch Transdermal Q72H  . sennosides  2.5 mL Per Tube BID    Continuous Infusions: . piperacillin-tazobactam (ZOSYN)  IV 3.375 g (08/24/18 0537)    PRN Meds: Acetaminophen, glycopyrrolate, ipratropium-albuterol, morphine CONCENTRATE, [DISCONTINUED] ondansetron **OR** ondansetron (ZOFRAN) IV, polyethylene glycol, trolamine salicylate  Physical Exam          Vital Signs: BP 125/83   Pulse 91   Temp 98.8 F (37.1 C) (Oral)   Resp 15   Wt 56.7 kg   SpO2 96%   BMI 20.18 kg/m  SpO2: SpO2: 96 % O2 Device: O2 Device: Room Air O2 Flow Rate: O2 Flow Rate (L/min): 2 L/min  Intake/output summary:   Intake/Output Summary (Last 24 hours) at 08/24/2018 1224 Last data filed at 08/24/2018 0600 Gross per 24 hour  Intake 153.44 ml  Output 845 ml  Net -691.56 ml   LBM: Last BM Date: 08/21/18 Baseline Weight: Weight: 63 kg Most recent weight: Weight: 56.7 kg       Palliative Assessment/Data:    Flowsheet Rows     Most Recent Value  Intake Tab  Referral Department  Hospitalist  Unit at Time of Referral  ER  Palliative Care Primary Diagnosis  Sepsis/Infectious Disease  Date Notified  08/22/18  Palliative Care Type  New Palliative care  Reason for referral  Clarify Goals of Care  Date of Admission  08/22/18  # of days IP prior to Palliative referral  0  Clinical Assessment  Psychosocial & Spiritual Assessment  Palliative Care Outcomes      Patient Active Problem List   Diagnosis Date Noted  . Acute respiratory failure with hypoxia ([place])   . Advanced care  planning/counseling discussion   . Goals of care, counseling/discussion   . Palliative care by specialist   . Hematemesis 08/22/2018  . GIB (gastrointestinal bleeding) 08/22/2018  . Pressure injury of skin 08/22/2018  . Metabolic encephalopathy   . Postpyloric ulcer   . Primary osteoarthritis involving multiple joints 12/03/2017  . Cognitive deficit due to old cerebrovascular accident (CVA) 11/30/2016  . Polyneuropathy due to medical condition ([place]) 11/30/2016  . Controlled type 2 diabetes mellitus with diabetic autonomic neuropathy, without long-term current use of insulin ([place]) 11/04/2016  . Hyperlipidemia 11/04/2016  . Hypertension 11/04/2016  . BPH associated with nocturia 11/04/2016  . H/O blood clots   . Asthma   . History of stroke in adulthood 05/31/2013    Palliative Care Assessment & Plan   Patient Profile: 68 y.o. male  with past medical history of CVA resulting in L and R hemiplegia and dysphagia, g-tube placement, dysarthria and aphasia, a-fib with RVR, urinary retention, admitted on 08/22/2018 with hematemesis. He subsequently aspirated and developed aspiration pneumonia. He was intubated for EGD and has been extubated to room air. EGD revealed old blood likely from ulcer created by balloon from g-tube. Surgery consulted- no surgical invention needed.  Palliative medicine consulted for "long-term poor prognosis".  Assessment/Recommendations/Plan   DNR  Continue full scope care  Robinul .75m q4hr prn for secretions  Morphine 2.560msublingual for increased RR and pain  Goals of Care and Additional Recommendations:  Limitations on Scope of Treatment: Full Scope Treatment  Code Status:  DNR  Prognosis:   Unable to determine  Discharge Planning:  To Be Determined  Care plan was discussed with patient and RN  Thank you for allowing the Palliative Medicine Team to assist in the care of this patient.   Time In: 1200 Time Out: 1235 Total Time  35 minutes  Prolonged Time Billed no      Greater than 50%  of this time was spent counseling and coordinating care related to the above assessment and plan.  Mariana Kaufman, AGNP-C Palliative Medicine   Please contact Palliative Medicine Team phone at [phone] for questions and concerns.

## 2018-08-24 NOTE — Progress Notes (Signed)
PULMONARY/CCM PROGRESS NOTE  Requesting MD/Service: Salary Date of initial consultation: 02/17 Reason for consultation: elective intubation prior to EGD  PT PROFILE: 68 y.o. male with extensive PMH, most significantly devastating stroke 5 years prior to admission, chronic Foley catheter, chronic G-tube presented to [facility] ED with hematemesis (frothy brown) and subsequently developed respiratory distress.  Required intubation prior to EGD  MAJOR EVENTS/TEST RESULTS: 02/17 admission as documented above 02/17 EGD: G tube, with the balloon obstructing the pyloric channel, present. One non-bleeding duodenal ulcer with a clean ulcer base (Forrest Class III). Blood in the second portion of the duodenum  INDWELLING DEVICES: ETT 02/17 >> 02/18   MICRO DATA: MRSA PCR 02/17 >> POS Resp 0217 >>  Blood 02/17 >>   ANTIMICROBIALS:  Vanc 02/17 >> 02/17 Pip-tazo 02/17 >>    SUBJ: Continues to tolerate extubation but has copious oropharyngeal and airway secretions requiring frequent suctioning.  No overt distress.  Denies pain and dyspnea.  Vitals:   08/24/18 0400 08/24/18 0500 08/24/18 0600 08/24/18 0853  BP: 122/89 115/81 125/83   Pulse: 90 86 91   Resp: 14 18 15    Temp: 98.8 F (37.1 C)     TempSrc: Oral     SpO2: 100% 100% 100% 96%  Weight:      RA  EXAM:   Gen: No distress presently on room air HEENT: NCAT, sclerae white, edentulous Neck: Supple, no JVD noted Lungs: Diffuse bilateral rhonchi Cardiovascular: Regular, no M Abdomen: Soft, NT, + BS Ext: No C/C/E Neuro: CNs intact, moves all extremities, aphasic Skin: No lesions noted   DATA:   BMP Latest Ref Rng & Units 08/24/2018 08/23/2018 08/22/2018  Glucose 70 - 99 mg/dL 023(X) 435(W) 861(U)  BUN 8 - 23 mg/dL 83(F) 29(M) 14  Creatinine 0.61 - 1.24 mg/dL 2.11 1.55 2.08  BUN/Creat Ratio 6 - 22 (calc) - - -  Sodium 135 - 145 mmol/L 145 144 140  Potassium 3.5 - 5.1 mmol/L 3.9 2.9(L) 3.8  Chloride 98 - 111 mmol/L 111 108 98   CO2 22 - 32 mmol/L 31 27 31   Calcium 8.9 - 10.3 mg/dL 0.2(M) 3.3(K) 9.2    CBC Latest Ref Rng & Units 08/24/2018 08/23/2018 08/22/2018  WBC 4.0 - 10.5 K/uL 12.4(H) 16.1(H) -  Hemoglobin 13.0 - 17.0 g/dL 10.1(L) 11.1(L) 12.9(L)  Hematocrit 39.0 - 52.0 % 32.6(L) 35.1(L) 41.4  Platelets 150 - 400 K/uL 171 176 -    CXR: No new film  I have personally reviewed all chest radiographs reported above including CXRs and CT chest unless otherwise indicated  IMPRESSION:   1) acute respiratory failure with hypoxemia, resolved 2) LLL aspiration pneumonia 3) hematemesis due to malpositioned G tube cuff obstructing pyloric channel 4) history of CVA, chronic G-tube, chronic Foley 5) history of asthma, bronchospasm resolved 6) DNR established 12/18   PLAN:  Monitor and SDU through today Continue nebulized steroids and bronchodilators ordered Continue ICU hemodynamic monitoring Continue TF's per G-tube Advance activity as tolerated.  PT OT eval and management requested Monitor temp, WBC count Micro and abx as above DVT px: SCDs Monitor CBC intermittently Transfuse per usual guidelines  Billy Fischer, MD PCCM service Mobile [phone] Pager [phone] 08/24/2018 1:12 PM

## 2018-08-24 NOTE — Progress Notes (Signed)
Admitted yesterday for [facility] - [place] at [facility]   PATIENT NAME: Shane Sloan    MR#:  [id number]  DATE OF BIRTH:  11-18-1950  SUBJECTIVE: Patient is extubated,.  Seen by palliative care.  Aphasic at baseline but appears comfortable.  CHIEF COMPLAINT:   Chief Complaint  Patient presents with  . Emesis    REVIEW OF SYSTEMS:   Review of Systems  Unable to perform ROS: Medical condition     DRUG ALLERGIES:   Allergies  Allergen Reactions  . Lisinopril Hives and Swelling    VITALS:  Blood pressure 125/83, pulse 91, temperature 98.8 F (37.1 C), temperature source Oral, resp. rate 15, weight 56.7 kg, SpO2 96 %.  PHYSICAL EXAMINATION:  GENERAL:  68 y.o.-year-old patient lying in the bed, currently intubated morning.Marland Kitchen  EYES: Pupils equal, round, reactive to light a. No scleral icterus. Extraocular muscles intact.  HEENT: Head atraumatic, normocephalic.  Orally intubated.  NECK:  Supple, no jugular venous distention. No thyroid enlargement, no tenderness.  LUNGS: Normal breath sounds bilaterally, no wheezing, rales,rhonchi or crepitation. No use of accessory muscles of respiration.  CARDIOVASCULAR: S1, S2 normal. No murmurs, rubs, or gallops.  ABDOMEN: Soft, nontender, nondistended. Bowel sounds present. No organomegaly or mass.  PEG tube site is not infected. EXTREMITIES: No pedal edema, cyanosis, or clubbing.  NEUROLOGIC:extubated,, has baseline aphasia pSYCHIATRIC: The patient is alert and oriented x 3.  SKIN: No obvious rash, lesion, or ulcer.    LABORATORY PANEL:   CBC Recent Labs  Lab 08/24/18 0514  WBC 12.4*  HGB 10.1*  HCT 32.6*  PLT 171   ------------------------------------------------------------------------------------------------------------------  Chemistries  Recent Labs  Lab 08/22/18 0853 08/23/18 0523 08/24/18 0514  NA 140 144 145  K 3.8 2.9* 3.9  CL 98 108 111  CO2 31 27 31   GLUCOSE 182* 165* 130*  BUN  14 26* 26*  CREATININE 0.76 0.95 0.71  CALCIUM 9.2 8.2* 8.4*  MG  --  2.4  --   AST 32  --   --   ALT 33  --   --   ALKPHOS 74  --   --   BILITOT 1.0  --   --    ------------------------------------------------------------------------------------------------------------------  Cardiac Enzymes No results for input(s): TROPONINI in the last 168 hours. ------------------------------------------------------------------------------------------------------------------  RADIOLOGY:  Dg Chest Port 1 View  Result Date: 08/23/2018 CLINICAL DATA:  Respiratory failure.  Follow-up exam. EXAM: PORTABLE CHEST 1 VIEW COMPARISON:  08/22/2018 FINDINGS: Endotracheal tube tip now projects 2.8 cm above the Carina, well positioned. There is persistent opacity at the medial left lung base consistent with pneumonia or atelectasis. Lungs demonstrate prominent bronchovascular markings diffusely but are otherwise clear. No pleural effusion or pneumothorax. IMPRESSION: 1. Well-positioned endotracheal tube. 2. Persistent left medial lung base opacity consistent with atelectasis or pneumonia. Electronically Signed   By: Amie Portland M.D.   On: 08/23/2018 07:42   Dg Chest Port 1 View  Result Date: 08/22/2018 CLINICAL DATA:  Status post intubation EXAM: PORTABLE CHEST 1 VIEW COMPARISON:  August 22, 2018 FINDINGS: The ET tube is been placed, terminating at the carina. Recommend withdrawing 3 cm. The cardiomediastinal silhouette is stable. No pneumothorax. Mild opacity in left base is slightly more prominent the interval. No other interval changes. IMPRESSION: 1. The ETT terminates at the carina.  Recommend withdrawing 3 cm. 2. Increased opacity in left base may represent atelectasis or infiltrate. Recommend attention on follow-up. 3. No other acute abnormalities. These results will be  called to the ordering clinician or representative by the Radiologist Assistant, and communication documented in the PACS or zVision  Dashboard. Electronically Signed   By: Gerome Sam III M.D   On: 08/22/2018 16:28    EKG:   Orders placed or performed during the hospital encounter of 08/22/18  . ED EKG  . ED EKG  . EKG 12-Lead  . EKG 12-Lead    ASSESSMENT AND PLAN:   68 year old male with history of previous stroke aphasia brought in from nursing home because of hematemesis, patient developed respiratory distress prior to EGD so intubated, transferred to ICU after EGD.  #1. acute respiratory failure, likely aspiration event c status post intubation, continue Zosyn for aspiration pneumonia.   2.  GI bleeding, patient had developed upper GI bleed, patient had emergent EGD yesterday which showed nonbleeding duodenal ulcer, patient is hemodynamically stable with stable hemoglobin, now on IV PPI, 3. dysphagia with inadequate oral intake, seen by dietitian,  started tube feeding with free water flushes. 4 diabetes mellitus type 5.  Hypertension: Controlled. 6.  Hyperlipidemia History of stroke in 2014 with aphasia, dysphasia status post G-tube placement #7. gastric outlet obstruction, duodenal ulcer, G-tube repositioning by gastroenterology   #8. hypokalemia: Replaced the potassium, potassium is better now 3.9. 9.  Continue contact precautions because of MRSA  Patient seen by palliative care,  recommended DNR status, CODE STATUS changed to DNR. All the records are reviewed and case discussed with Care Management/Social Workerr. Management plans discussed with the patient, family and they are in agreement.  CODE STATUS: DNR  TOTAL TIME TAKING CARE OF THIS PATIENT: 38 minutes.   POSSIBLE D/C IN 1-2 DAYS, DEPENDING ON CLINICAL CONDITION.   Katha Hamming M.D on 08/24/2018 at 1:13 PM  Between 7am to 6pm - Pager - [phone]  After 6pm go to [email or web address] - password [email or web address] [facility]  [place] [facility]  Office  [phone]  CC: Primary care physician; Charlott Rakes, MD   Note: This  dictation was prepared with Dragon dictation along with smaller phrase technology. Any transcriptional errors that result from this process are unintentional.

## 2018-08-25 MED ORDER — FREE WATER
200.0000 mL | Freq: Every day | Status: DC
  Administered 2018-08-25 – 2018-08-30 (×24): 200 mL

## 2018-08-25 NOTE — Progress Notes (Addendum)
Admitted yesterday for [facility] - [place] at [facility]   PATIENT NAME: Shane Sloan    MR#:  [id number]  DATE OF BIRTH:  1951/05/03  SUBJECTIVE: Patient is extubated,.  Seen by palliative care.  Aphasic at baseline but appears comfortable.  CHIEF COMPLAINT:   Patient is refusing suctioning, PT/OT to evaluate  REVIEW OF SYSTEMS:   Review of Systems  Unable to perform ROS: Medical condition     DRUG ALLERGIES:   Allergies  Allergen Reactions  . Lisinopril Hives and Swelling    VITALS:  Blood pressure (!) 141/115, pulse 90, temperature 98.4 F (36.9 C), temperature source Oral, resp. rate 13, weight 56.7 kg, SpO2 97 %.  PHYSICAL EXAMINATION:  GENERAL:  68 y.o.-year-old patient lying in the bed, currently intubated morning.Marland Kitchen  EYES: Pupils equal, round, reactive to light a. No scleral icterus. Extraocular muscles intact.  HEENT: Head atraumatic, normocephalic.  Orally intubated.  NECK:  Supple, no jugular venous distention. No thyroid enlargement, no tenderness.  LUNGS: Normal breath sounds bilaterally, no wheezing, rales,rhonchi or crepitation. No use of accessory muscles of respiration.  CARDIOVASCULAR: S1, S2 normal. No murmurs, rubs, or gallops.  ABDOMEN: Soft, nontender, nondistended. Bowel sounds present. No organomegaly or mass.  PEG tube site is not infected. EXTREMITIES: No pedal edema, cyanosis, or clubbing.  NEUROLOGIC:extubated,, has baseline aphasia pSYCHIATRIC: The patient is alert and oriented x 3.  SKIN: No obvious rash, lesion, or ulcer.    LABORATORY PANEL:   CBC Recent Labs  Lab 08/24/18 0514  WBC 12.4*  HGB 10.1*  HCT 32.6*  PLT 171   ------------------------------------------------------------------------------------------------------------------  Chemistries  Recent Labs  Lab 08/22/18 0853 08/23/18 0523 08/24/18 0514  NA 140 144 145  K 3.8 2.9* 3.9  CL 98 108 111  CO2 31 27 31   GLUCOSE 182* 165* 130*  BUN  14 26* 26*  CREATININE 0.76 0.95 0.71  CALCIUM 9.2 8.2* 8.4*  MG  --  2.4  --   AST 32  --   --   ALT 33  --   --   ALKPHOS 74  --   --   BILITOT 1.0  --   --    ------------------------------------------------------------------------------------------------------------------  Cardiac Enzymes No results for input(s): TROPONINI in the last 168 hours. ------------------------------------------------------------------------------------------------------------------  RADIOLOGY:  No results found.  EKG:   Orders placed or performed during the hospital encounter of 08/22/18  . ED EKG  . ED EKG  . EKG 12-Lead  . EKG 12-Lead    ASSESSMENT AND PLAN:  68 year old male with history of previous stroke aphasia brought in from nursing home because of hematemesis, patient developed respiratory distress prior to EGD so intubated, transferred to ICU after EGD.  *acute respiratory failure likely secondary to aspiration Successfully extubated, continue aspiration protocol, empiric Zosyn, follow-up on outstanding cultures  *Acute GI bleeding s/p EGD noted for nonbleeding duodenal ulcer  *dysphagia with inadequate oral intake Dietary input appreciated Continue tube feeds with free water  *Chronic diabetes mellitus type 2 Stable on current regiment  *Chronic hyperlipidemia, unspecified Stable  *Chronic hypertension Controlled on current regiment  *History of CVA 2014 with aphasia, dysphasia s/p G-tube  Stable  Supportive care PRN   *Acute gastric outlet obstruction w/ duodenal ulcer G-tube repositioning by gastroenterology   *hypokalemia Repleted   Patient seen by palliative care,  recommended DNR status, CODE STATUS changed to DNR. All the records are reviewed and case discussed with Care Management/Social Workerr. Management plans discussed with the  patient, family and they are in agreement.  CODE STATUS: DNR  TOTAL TIME TAKING CARE OF THIS PATIENT: 35 minutes.    POSSIBLE D/C IN 1-3 DAYS, DEPENDING ON CLINICAL CONDITION.   Evelena Asa Salary M.D on 08/25/2018 at 12:37 PM  Between 7am to 6pm - Pager - [phone]  After 6pm go to [email or web address] - password [email or web address] [facility]  [place] [facility]  Office  [phone]  CC: Primary care physician; Charlott Rakes, MD   Note: This dictation was prepared with Dragon dictation along with smaller phrase technology. Any transcriptional errors that result from this process are unintentional.

## 2018-08-25 NOTE — Progress Notes (Signed)
PULMONARY/CCM PROGRESS NOTE  Requesting MD/Service: Salary Date of initial consultation: 02/17 Reason for consultation: elective intubation prior to EGD  PT PROFILE: 68 y.o. male with extensive PMH, most significantly devastating stroke 5 years prior to admission, chronic Foley catheter, chronic G-tube presented to [facility] ED with hematemesis (frothy brown) and subsequently developed respiratory distress.  Required intubation prior to EGD  MAJOR EVENTS/TEST RESULTS: 02/17 admission as documented above 02/17 EGD: G tube, with the balloon obstructing the pyloric channel, present. One non-bleeding duodenal ulcer with a clean ulcer base (Forrest Class III). Blood in the second portion of the duodenum  INDWELLING DEVICES: ETT 02/17 >> 02/18   MICRO DATA: MRSA PCR 02/17 >> POS Resp 0217 >> Enterobacter aerogenes Blood 02/17 >> NEG  ANTIMICROBIALS:  Vanc 02/17 >> 02/17 Pip-tazo 02/17 >> 02/22 (scheduled stop date)   SUBJ: Comfortable on room air.  Continues to have significant airway secretions but is able to cough effectively when coached.  Vitals:   08/25/18 1000 08/25/18 1100 08/25/18 1200 08/25/18 1300  BP: 134/76 (!) 154/80 (!) 141/115 (!) 139/94  Pulse: (!) 105  90   Resp: 19 (!) 21 13 (!) 27  Temp:   (!) 94.4 F (34.7 C)   TempSrc:      SpO2: 95%  97%   Weight:      RA  EXAM:   Gen: NAD HEENT: NCAT, sclerae white, edentulous Neck: Supple, no JVD Lungs: Scattered bilateral rhonchi Cardiovascular: Regular, no M Abdomen: Soft, NT, + BS, G-tube insertion site clean Ext: No clubbing, cyanosis, edema Neuro: A phasic, cranial nerves intact, moves all extremities Skin: No lesions noted   DATA:   BMP Latest Ref Rng & Units 08/24/2018 08/23/2018 08/22/2018  Glucose 70 - 99 mg/dL 062(I) 948(N) 462(V)  BUN 8 - 23 mg/dL 03(J) 00(X) 14  Creatinine 0.61 - 1.24 mg/dL 3.81 8.29 9.37  BUN/Creat Ratio 6 - 22 (calc) - - -  Sodium 135 - 145 mmol/L 145 144 140  Potassium 3.5 - 5.1  mmol/L 3.9 2.9(L) 3.8  Chloride 98 - 111 mmol/L 111 108 98  CO2 22 - 32 mmol/L 31 27 31   Calcium 8.9 - 10.3 mg/dL 1.6(R) 6.7(E) 9.2    CBC Latest Ref Rng & Units 08/24/2018 08/23/2018 08/22/2018  WBC 4.0 - 10.5 K/uL 12.4(H) 16.1(H) -  Hemoglobin 13.0 - 17.0 g/dL 10.1(L) 11.1(L) 12.9(L)  Hematocrit 39.0 - 52.0 % 32.6(L) 35.1(L) 41.4  Platelets 150 - 400 K/uL 171 176 -    CXR: No new film  I have personally reviewed all chest radiographs reported above including CXRs and CT chest unless otherwise indicated  IMPRESSION:   1) acute respiratory failure with hypoxemia, resolved 2) LLL aspiration pneumonia, Enterobacter aerogenes 3) hematemesis, resolved 4) Malpositioned G tube with cuff obstructing pyloric channel, repositioned 4) history of CVA, chronic G-tube, chronic Foley 5) history of asthma without bronchospasm presently 6) DNR established 12/18   PLAN:  Transfer to MedSurg floor Continue nebulized steroids and bronchodilators ordered Continue TF's per G-tube Advance activity as tolerated.  PT eval and management requested Monitor temp, WBC count Micro and abx as above Complete 5 days Zosyn DVT px: SCDs Monitor CBC intermittently Transfuse per usual guidelines  After transfer, PCCM will sign off. Please call if we can be of further assistance  Billy Fischer, MD PCCM service Mobile [phone] Pager [phone] 08/25/2018 2:32 PM

## 2018-08-25 NOTE — Progress Notes (Signed)
Suctioning refused

## 2018-08-25 NOTE — Evaluation (Addendum)
Physical Therapy Evaluation Patient Details Name: Shane Sloan MRN: [id number] DOB: January 21, 1951 Today's Date: 08/25/2018   History of Present Illness  Pt is a 68 y.o male who arrived to the ED from nursing home with hematemisis and SpO2 in 80's. Pt was intubated 2/17. EGD performed while intubated and per chart review hematemisis likely due to malpositioning of chronic G tube. Obstruction removed and pt extubated. Pt with PMHx of CVA, metabolic encephalopathy, anemia, HTN, blood clots, DM, and expressive aphasia.     Clinical Impression  Mr. Spence admitted for the above and presents with the following deficits (see PT problem list below). Pt with significant expressive aphasia, no family member present, difficult to assess PLOF. Pt utilized Public affairs consultant with moderate accuracy throughout treatment. Impulsivity noted and required VC of SPT sporadically throughout treatment.  Pt min A for sup>sit. Pt able to maintain sitting balance with intermittent min A for 1-2 minutes. Mod A for sit>sup. Pt would benefit from continued skilled therapy via SNF to address balance impairments and muscle weakness.     Follow Up Recommendations SNF    Equipment Recommendations  Other (comment)(TBD at next venue )    Recommendations for Other Services       Precautions / Restrictions Precautions Precautions: Fall Precaution Comments: BP, SpO2  Restrictions Weight Bearing Restrictions: No      Mobility  Bed Mobility Overal bed mobility: Needs Assistance Bed Mobility: Supine to Sit;Sit to Supine     Supine to sit: Min assist Sit to supine: Mod assist   General bed mobility comments: Pt required Min A for sup>sit for trunk support. Mod A was provided for BLE and trunk support with sit>sup. Max verbal and tactile cues provided for transition.  Transfers                 General transfer comment: unable to attempt this session due to global weakness and decreased activity tolerance    Ambulation/Gait                Stairs            Wheelchair Mobility    Modified Rankin (Stroke Patients Only)       Balance Overall balance assessment: Needs assistance Sitting-balance support: Bilateral upper extremity supported;Feet unsupported Sitting balance-Leahy Scale: Poor Sitting balance - Comments: SPT intermittently Min A for pt to maintain sitting balance. Pt able to maintain sitting 1-2 minutes.                                      Pertinent Vitals/Pain Pain Assessment: Faces Faces Pain Scale: Hurts a little bit Pain Location: R foot  Pain Descriptors / Indicators: Grimacing;Guarding Pain Intervention(s): Limited activity within patient's tolerance;Monitored during session    Home Living Family/patient expects to be discharged to:: Skilled nursing facility                      Prior Function Level of Independence: Needs assistance   Gait / Transfers Assistance Needed: Pt poor historian but reports he was walking 3 days ago with walker. Unlikely pt was walking due to limited dorsiflexion less than neutral.   ADL's / Homemaking Assistance Needed: Pt poor historian        Hand Dominance        Extremity/Trunk Assessment   Upper Extremity Assessment Upper Extremity Assessment: RUE deficits/detail;LUE deficits/detail RUE Deficits / Details: Strength  grossly 3-/5. Pt able to flex shoulder ~110 degrees. Full range not thoroughly assessed. LUE Deficits / Details: Strength grossly 3-/5. Pt able to flex shoulder ~110 degrees.  Full range not thoroughly assessed.     Lower Extremity Assessment Lower Extremity Assessment: RLE deficits/detail;LLE deficits/detail RLE Deficits / Details: Strength grossly 3-/5. Pt able to demonstrate SLR off bed ~3 inches. Able to perform heel slide. Full range not thoroughly assessed. Pt does not have PROM to neutral with dorsiflexion.  LLE Deficits / Details: Strength grossly 3-/5. Pt able to  demonstrate SLR off bed ~3 inches. Able to perform heel slide. Full range not thoroughly assessed. Pt does not have PROM to neutral with dorsiflexion.    Cervical / Trunk Assessment Cervical / Trunk Assessment: Kyphotic  Communication   Communication: Expressive difficulties;Other (comment)(minimal speech, pt uses communication board mod effectively )  Cognition Arousal/Alertness: Awake/alert Behavior During Therapy: Anxious;Impulsive;Restless Overall Cognitive Status: Difficult to assess Area of Impairment: Attention;Following commands;Safety/judgement;Awareness;Problem solving;Memory                   Current Attention Level: Focused Memory: Decreased short-term memory Following Commands: Follows one step commands inconsistently Safety/Judgement: Decreased awareness of safety Awareness: Intellectual Problem Solving: Slow processing;Decreased initiation;Difficulty sequencing;Requires verbal cues;Requires tactile cues General Comments: Difficult to thoroughly assess pt cognition as he has expressive aphasia and moderately accurate with communication board.       General Comments General comments (skin integrity, edema, etc.): Pt demonstrated impulsivity with bed mobility and was given multiple VC to wait. BP monitored throughout session with no abnormalities, HR responded appropriately to activity.     Exercises     Assessment/Plan    PT Assessment Patient needs continued PT services  PT Problem List Decreased strength;Decreased range of motion;Decreased activity tolerance;Decreased balance;Decreased mobility;Decreased safety awareness;Cardiopulmonary status limiting activity;Pain;Decreased cognition       PT Treatment Interventions DME instruction;Gait training;Functional mobility training;Therapeutic activities;Therapeutic exercise;Balance training;Neuromuscular re-education;Cognitive remediation;Patient/family education;Wheelchair mobility training    PT Goals (Current  goals can be found in the Care Plan section)  Acute Rehab PT Goals Patient Stated Goal: Pt unable to state. Per SPT goal to become more mobile.  PT Goal Formulation: Patient unable to participate in goal setting Time For Goal Achievement: 09/08/18 Potential to Achieve Goals: Fair    Frequency Min 2X/week   Barriers to discharge        Co-evaluation               AM-PAC PT "6 Clicks" Mobility  Outcome Measure Help needed turning from your back to your side while in a flat bed without using bedrails?: A Little Help needed moving from lying on your back to sitting on the side of a flat bed without using bedrails?: A Lot Help needed moving to and from a bed to a chair (including a wheelchair)?: Total Help needed standing up from a chair using your arms (e.g., wheelchair or bedside chair)?: Total Help needed to walk in hospital room?: Total Help needed climbing 3-5 steps with a railing? : Total 6 Click Score: 9    End of Session   Activity Tolerance: Patient limited by fatigue Patient left: in bed;with call bell/phone within reach;with bed alarm set;with nursing/sitter in room Nurse Communication: Mobility status PT Visit Diagnosis: Muscle weakness (generalized) (M62.81);Difficulty in walking, not elsewhere classified (R26.2)    Time: 4782-95621056-1126 PT Time Calculation (min) (ACUTE ONLY): 30 min   Charges:   PT Evaluation $PT Eval Moderate Complexity: 1 Mod PT Treatments $  Therapeutic Activity: 8-22 mins        Emeline Gins, SPT  08/25/2018, 2:02 PM

## 2018-08-25 NOTE — Plan of Care (Signed)
Patient has functionally returned to baseline. Now has Med-Surg orders, awaiting placement.      Problem: Education: Goal: Knowledge of General Education information will improve Description Including pain rating scale, medication(s)/side effects and non-pharmacologic comfort measures Outcome: Progressing   Problem: Health Behavior/Discharge Planning: Goal: Ability to manage health-related needs will improve Outcome: Progressing   Problem: Clinical Measurements: Goal: Ability to maintain clinical measurements within normal limits will improve Outcome: Progressing Goal: Will remain free from infection Outcome: Progressing Goal: Diagnostic test results will improve Outcome: Progressing Goal: Respiratory complications will improve Outcome: Progressing Goal: Cardiovascular complication will be avoided Outcome: Progressing   Problem: Activity: Goal: Risk for activity intolerance will decrease Outcome: Progressing   Problem: Nutrition: Goal: Adequate nutrition will be maintained Outcome: Progressing   Problem: Coping: Goal: Level of anxiety will decrease Outcome: Progressing   Problem: Elimination: Goal: Will not experience complications related to bowel motility Outcome: Progressing Goal: Will not experience complications related to urinary retention Outcome: Progressing   Problem: Pain Managment: Goal: General experience of comfort will improve Outcome: Progressing   Problem: Safety: Goal: Ability to remain free from injury will improve Outcome: Progressing   Problem: Skin Integrity: Goal: Risk for impaired skin integrity will decrease Outcome: Progressing

## 2018-08-25 NOTE — Consult Note (Signed)
Pharmacy Antibiotic Note  Shane Sloan is a 68 y.o. male admitted on 08/22/2018 with pneumonia. He has a h/o CVA with chronic aphasia/PEG tube placement/bedbound state, presenting from local nursing home with vomiting of blood, hypoxia with O2 saturation in the 80s,  lactic acid 2.9, CXR noted for atelectasis versus pneumonia, large bloody emesis noted in the ED which is dark in color.  Tracheal Aspirate Cx: Enterobacter aerogenes (R-Cefazolin)  Plan: Zosyn EI 3.375g IV Q8hr.   Weight: 125 lb (56.7 kg)  Temp (24hrs), Avg:99.7 F (37.6 C), Min:98.5 F (36.9 C), Max:101.8 F (38.8 C)  Recent Labs  Lab 08/22/18 0853 08/23/18 0523 08/24/18 0514  WBC 10.1 16.1* 12.4*  CREATININE 0.76 0.95 0.71  LATICACIDVEN 2.9*  --   --     Estimated Creatinine Clearance: 72.8 mL/min (by C-G formula based on SCr of 0.71 mg/dL).    Allergies  Allergen Reactions  . Lisinopril Hives and Swelling    Antimicrobials this admission: vancomycin 2/17 x 1 Zosyn 2/17 >>  Microbiology results: 2/17 BCx: pending 2/17 Tracheal Aspirate: pending  2/18 MRSA PCR: positive   Thank you for allowing pharmacy to be a part of this patient's care.  Clovia Cuff, PharmD, BCPS 08/25/2018 9:57 AM

## 2018-08-26 ENCOUNTER — Inpatient Hospital Stay: Payer: Medicare HMO

## 2018-08-26 MED ORDER — ACETAMINOPHEN 160 MG/5ML PO SOLN
650.0000 mg | Freq: Three times a day (TID) | ORAL | Status: DC | PRN
  Administered 2018-08-27: 650 mg
  Filled 2018-08-26 (×2): qty 20.3

## 2018-08-26 MED ORDER — GUAIFENESIN 100 MG/5ML PO SOLN
5.0000 mL | Freq: Three times a day (TID) | ORAL | Status: AC
  Administered 2018-08-26 – 2018-08-27 (×6): 100 mg
  Filled 2018-08-26 (×6): qty 10

## 2018-08-26 MED ORDER — IPRATROPIUM-ALBUTEROL 0.5-2.5 (3) MG/3ML IN SOLN
3.0000 mL | Freq: Three times a day (TID) | RESPIRATORY_TRACT | Status: DC
  Administered 2018-08-26 – 2018-08-30 (×11): 3 mL via RESPIRATORY_TRACT
  Filled 2018-08-26 (×12): qty 3

## 2018-08-26 MED ORDER — CEFDINIR 300 MG PO CAPS
300.0000 mg | ORAL_CAPSULE | Freq: Two times a day (BID) | ORAL | Status: AC
  Administered 2018-08-26 – 2018-08-27 (×4): 300 mg
  Filled 2018-08-26 (×4): qty 1

## 2018-08-26 MED ORDER — BUDESONIDE 0.25 MG/2ML IN SUSP
0.2500 mg | Freq: Two times a day (BID) | RESPIRATORY_TRACT | Status: DC
  Administered 2018-08-27 – 2018-08-30 (×7): 0.25 mg via RESPIRATORY_TRACT
  Filled 2018-08-26 (×7): qty 2

## 2018-08-26 NOTE — Progress Notes (Addendum)
Admitted yesterday for [facility] [place] at [facility]   PATIENT NAME: Shane Sloan    MR#:  [id number]  DATE OF BIRTH:  September 18, 1950  SUBJECTIVE: Patient is extubated,.  Seen by palliative care.  Aphasic at baseline but appears comfortable.  CHIEF COMPLAINT:   Patient with expressive aphasia at baseline, noted chest congestion with productive cough, 2 view chest x-ray noted for left lower lobe pneumonia, scheduled Mucinex twice daily, change antibiotics to oral agent via PEG, scopolamine patch scheduled, aspiration precautions, head of bed at 30 degrees REVIEW OF SYSTEMS:   Review of Systems  Unable to perform ROS: Medical condition     DRUG ALLERGIES:   Allergies  Allergen Reactions  . Lisinopril Hives and Swelling    VITALS:  Blood pressure 111/82, pulse 98, temperature 99.1 F (37.3 C), resp. rate 16, weight 56.7 kg, SpO2 96 %.  PHYSICAL EXAMINATION:  GENERAL:  68 y.o.-year-old patient lying in the bed, currently intubated morning.Marland Kitchen  EYES: Pupils equal, round, reactive to light a. No scleral icterus. Extraocular muscles intact.  HEENT: Head atraumatic, normocephalic.  Orally intubated.  NECK:  Supple, no jugular venous distention. No thyroid enlargement, no tenderness.  LUNGS: Normal breath sounds bilaterally, no wheezing, rales,rhonchi or crepitation. No use of accessory muscles of respiration.  CARDIOVASCULAR: S1, S2 normal. No murmurs, rubs, or gallops.  ABDOMEN: Soft, nontender, nondistended. Bowel sounds present. No organomegaly or mass.  PEG tube site is not infected. EXTREMITIES: No pedal edema, cyanosis, or clubbing.  NEUROLOGIC:extubated,, has baseline aphasia pSYCHIATRIC: The patient is alert and oriented x 3.  SKIN: No obvious rash, lesion, or ulcer.    LABORATORY PANEL:   CBC Recent Labs  Lab 08/24/18 0514  WBC 12.4*  HGB 10.1*  HCT 32.6*  PLT 171    ------------------------------------------------------------------------------------------------------------------  Chemistries  Recent Labs  Lab 08/22/18 0853 08/23/18 0523 08/24/18 0514  NA 140 144 145  K 3.8 2.9* 3.9  CL 98 108 111  CO2 31 27 31   GLUCOSE 182* 165* 130*  BUN 14 26* 26*  CREATININE 0.76 0.95 0.71  CALCIUM 9.2 8.2* 8.4*  MG  --  2.4  --   AST 32  --   --   ALT 33  --   --   ALKPHOS 74  --   --   BILITOT 1.0  --   --    ------------------------------------------------------------------------------------------------------------------  Cardiac Enzymes No results for input(s): TROPONINI in the last 168 hours. ------------------------------------------------------------------------------------------------------------------  RADIOLOGY:  Dg Chest 2 View  Result Date: 08/26/2018 CLINICAL DATA:  Evaluate for airway aspiration. EXAM: CHEST - 2 VIEW COMPARISON:  None. FINDINGS: Normal heart size. There is no pleural effusion or edema identified. Asymmetric opacity within the left lung base is identified compatible with either pneumonia or aspiration. Right lung clear. IMPRESSION: 1. Left base opacity concerning for pneumonia or aspiration. Electronically Signed   By: Signa Kell M.D.   On: 08/26/2018 10:26    EKG:   Orders placed or performed during the hospital encounter of 08/22/18  . ED EKG  . ED EKG  . EKG 12-Lead  . EKG 12-Lead    ASSESSMENT AND PLAN:  68 year old male with history of previous stroke aphasia brought in from nursing home because of hematemesis, patient developed respiratory distress prior to EGD so intubated, transferred to ICU after EGD.  *acute respiratory failure likely secondary to aspiration pneumonia Successfully extubated, suctioning PRN, aspiration precautions, head of bed at 30 degrees at all times  *  Acute left aspiration pneumonia Continue pneumonia protocol, treated initially with Zosyn-change to cefdinir twice daily to  complete antibiotic course   *Acute GI bleeding Hemoglobin stable Suspected due to malposition PEG tube which was adjusted by gastroenterology, general surgery did see patient while in house s/p EGD noted for nonbleeding duodenal ulcer  *Acute on chronic dysphagia with inadequate oral intake Secondary to CVA Dietary consult while in house, continue tube feeds, aspiration precautions, head of bed at 30 degrees, scheduled scopolamine patch  *Chronic diabetes mellitus type 2 Stable on current regiment  *Chronic hyperlipidemia, unspecified Stable  *Chronic hypertension Controlled on current regiment  *History of CVA 2014 with aphasia, dysphasia s/p G-tube  Stable  Supportive care PRN   *Acute gastric outlet obstruction w/ duodenal ulcer G-tube repositioning by gastroenterology   *hypokalemia Repleted  Disposition back to skilled nursing facility in 1 to 2 days pending clinical course  Patient seen by palliative care,  recommended DNR status, CODE STATUS changed to DNR. All the records are reviewed and case discussed with Care Management/Social Workerr. Management plans discussed with the patient, family and they are in agreement.  CODE STATUS: DNR  TOTAL TIME TAKING CARE OF THIS PATIENT: 35 minutes.   POSSIBLE D/C IN 1-2 DAYS, DEPENDING ON CLINICAL CONDITION.   Evelena Asa Jersee Winiarski M.D on 08/26/2018 at 11:56 AM  Between 7am to 6pm - Pager - [phone]  After 6pm go to [email or web address] - password [email or web address] [facility]  [place] [facility]  Office  [phone]  CC: Primary care physician; Charlott Rakes, MD   Note: This dictation was prepared with Dragon dictation along with smaller phrase technology. Any transcriptional errors that result from this process are unintentional.

## 2018-08-26 NOTE — Care Management Important Message (Signed)
Important Message  Patient Details  Name: Cheick Vert MRN: [id number] Date of Birth: 1951-05-20   Medicare Important Message Given:  Yes    Gwenette Greet, RN 08/26/2018, 12:06 PM

## 2018-08-26 NOTE — Progress Notes (Signed)
Physical Therapy Treatment Patient Details Name: Shane Sloan MRN: [id number] DOB: 1951/04/16 Today's Date: 08/26/2018    History of Present Illness Pt is a 68 y.o male who arrived to the ED from nursing home with hematemisis and SpO2 in 80's. Pt was intubated 2/17. EGD performed while intubated and per chart review hematemisis likely due to malpositioning of chronic G tube. Obstruction removed and pt extubated. Pt with PMHx of CVA, metabolic encephalopathy, anemia, HTN, blood clots, DM, and expressive aphasia.     PT Comments    Shane Sloan progressed towards his goals this session. Min A for trunk support with sup>sit. Pt sat on EOB with min guard and was able to perform alternating unilateral reaching exercise x5 each UE. Min A x2 provided for sit>sup for BLE and trunk support. Current plan remains appropriate.    Follow Up Recommendations  SNF     Equipment Recommendations  Other (comment)(TBD at next venue )    Recommendations for Other Services       Precautions / Restrictions Precautions Precautions: Fall Precaution Comments: BP, SpO2  Restrictions Weight Bearing Restrictions: No    Mobility  Bed Mobility Overal bed mobility: Needs Assistance Bed Mobility: Supine to Sit;Sit to Supine     Supine to sit: Min assist;HOB elevated Sit to supine: Min assist;+2 for physical assistance;HOB elevated   General bed mobility comments: Min A x1 for sup>sit for trunk support. Min A x2 for sit>supine. Pt on RA throughout session, SpO2 remained at or above 94%. Bp in supine 122/81. BP in sitting 127/71.  Transfers                 General transfer comment: unable to attempt due to global weakness.   Ambulation/Gait                 Stairs             Wheelchair Mobility    Modified Rankin (Stroke Patients Only)       Balance Overall balance assessment: Needs assistance Sitting-balance support: Bilateral upper extremity supported;Feet  supported Sitting balance-Leahy Scale: Fair Sitting balance - Comments: Pt able to perform sitting functional reaching exericse with intermittent UE support. Min guard in sitting. If perturbed pt would likely lose balance.                                    Cognition Arousal/Alertness: Awake/alert Behavior During Therapy: Anxious;Impulsive;Restless Overall Cognitive Status: Difficult to assess Area of Impairment: Attention;Memory;Following commands;Safety/judgement;Awareness;Problem solving                   Current Attention Level: Focused Memory: Decreased short-term memory Following Commands: Follows one step commands inconsistently Safety/Judgement: Decreased awareness of safety Awareness: Intellectual Problem Solving: Slow processing;Decreased initiation;Difficulty sequencing;Requires verbal cues;Requires tactile cues General Comments: Pt more alert this session and more used communication more accurately.       Exercises General Exercises - Lower Extremity Ankle Circles/Pumps: AROM;Both;10 reps;Supine Heel Slides: Strengthening;Both;15 reps;Supine Straight Leg Raises: Strengthening;Both;10 reps;Supine Other Exercises Other Exercises: seated alternating unilateral UE reaching x5 each UE to focus on sitting balance.     General Comments General comments (skin integrity, edema, etc.): Pt on RA, SpO2 remained at or above 94%.       Pertinent Vitals/Pain Pain Assessment: No/denies pain(no signs of pain)    Home Living  Prior Function            PT Goals (current goals can now be found in the care plan section) Acute Rehab PT Goals Patient Stated Goal: unable to state  PT Goal Formulation: Patient unable to participate in goal setting Time For Goal Achievement: 09/08/18 Potential to Achieve Goals: Fair Progress towards PT goals: Progressing toward goals    Frequency    Min 2X/week      PT Plan Current plan  remains appropriate    Co-evaluation              AM-PAC PT "6 Clicks" Mobility   Outcome Measure  Help needed turning from your back to your side while in a flat bed without using bedrails?: A Little Help needed moving from lying on your back to sitting on the side of a flat bed without using bedrails?: A Lot Help needed moving to and from a bed to a chair (including a wheelchair)?: Total Help needed standing up from a chair using your arms (e.g., wheelchair or bedside chair)?: Total Help needed to walk in hospital room?: Total Help needed climbing 3-5 steps with a railing? : Total 6 Click Score: 9    End of Session   Activity Tolerance: Patient tolerated treatment well;Patient limited by fatigue Patient left: in bed;with call bell/phone within reach;with bed alarm set Nurse Communication: Mobility status PT Visit Diagnosis: Muscle weakness (generalized) (M62.81);Difficulty in walking, not elsewhere classified (R26.2)     Time: 2706-2376 PT Time Calculation (min) (ACUTE ONLY): 35 min  Charges:  $Therapeutic Exercise: 8-22 mins $Therapeutic Activity: 8-22 mins                    Emeline Gins, SPT  08/26/2018, 4:18 PM

## 2018-08-27 LAB — BASIC METABOLIC PANEL
Anion gap: 5 (ref 5–15)
BUN: 21 mg/dL (ref 8–23)
CO2: 30 mmol/L (ref 22–32)
Calcium: 8.3 mg/dL — ABNORMAL LOW (ref 8.9–10.3)
Chloride: 105 mmol/L (ref 98–111)
Creatinine, Ser: 0.61 mg/dL (ref 0.61–1.24)
GFR calc Af Amer: >60 mL/min (ref >60)
GFR calc non Af Amer: >60 mL/min (ref >60)
Glucose, Bld: 118 mg/dL — ABNORMAL HIGH (ref 70–99)
Potassium: 3.5 mmol/L (ref 3.5–5.1)
Sodium: 140 mmol/L (ref 135–145)

## 2018-08-27 LAB — CBC
HCT: 32.7 % — ABNORMAL LOW (ref 39.0–52.0)
Hemoglobin: 10.4 g/dL — ABNORMAL LOW (ref 13.0–17.0)
MCH: 28.2 pg (ref 26.0–34.0)
MCHC: 31.8 g/dL (ref 30.0–36.0)
MCV: 88.6 fL (ref 80.0–100.0)
Platelets: 233 10*3/uL (ref 150–400)
RBC: 3.69 MIL/uL — ABNORMAL LOW (ref 4.22–5.81)
RDW: 13.7 % (ref 11.5–15.5)
WBC: 11 10*3/uL — ABNORMAL HIGH (ref 4.0–10.5)
nRBC: 0 % (ref 0.0–0.2)

## 2018-08-27 LAB — CULTURE, BLOOD (ROUTINE X 2)
Culture: NO GROWTH
Culture: NO GROWTH
SPECIAL REQUESTS: ADEQUATE
SPECIAL REQUESTS: ADEQUATE

## 2018-08-27 NOTE — NC FL2 (Signed)
[place] LEVEL OF CARE SCREENING TOOL     IDENTIFICATION  Patient Name: Shane Sloan Birthdate: 04/28/1951 Sex: male Admission Date (Current Location): 08/22/2018  [place] and [place] Number:  Selena Lesser [id number] [place] and Address:  [facility], [address] [place], [place] [address]      Provider Number: [id number]  Attending Physician Name and Address:  Dustin Flock, MD  Relative Name and Phone Number:  Delores Thelen (Daughter) [phone]    Current Level of Care: Hospital Recommended Level of Care: [place] Prior Approval Number:    Date Approved/Denied:   PASRR Number: [id number]  Discharge Plan: SNF    Current Diagnoses: Patient Active Problem List   Diagnosis Date Noted  . Pneumonia due to infectious organism   . Acute respiratory failure with hypoxia ([place])   . Advanced care planning/counseling discussion   . Goals of care, counseling/discussion   . Palliative care by specialist   . Hematemesis 08/22/2018  . GIB (gastrointestinal bleeding) 08/22/2018  . Pressure injury of skin 08/22/2018  . Metabolic encephalopathy   . Postpyloric ulcer   . Primary osteoarthritis involving multiple joints 12/03/2017  . Cognitive deficit due to old cerebrovascular accident (CVA) 11/30/2016  . Polyneuropathy due to medical condition ([place]) 11/30/2016  . Controlled type 2 diabetes mellitus with diabetic autonomic neuropathy, without long-term current use of insulin ([place]) 11/04/2016  . Hyperlipidemia 11/04/2016  . Hypertension 11/04/2016  . BPH associated with nocturia 11/04/2016  . H/O blood clots   . Asthma   . History of stroke in adulthood 05/31/2013    Orientation RESPIRATION BLADDER Height & Weight     Self, Time, Situation, Place  Normal Indwelling catheter Weight: 125 lb (56.7 kg) Height:     BEHAVIORAL SYMPTOMS/MOOD NEUROLOGICAL BOWEL NUTRITION STATUS      Incontinent Feeding tube(PEG)   AMBULATORY STATUS COMMUNICATION OF NEEDS Skin   Extensive Assist Non-Verbally Normal                       Personal Care Assistance Level of Assistance  Bathing, Feeding, Dressing Bathing Assistance: Maximum assistance Feeding assistance: Maximum assistance Dressing Assistance: Maximum assistance     Functional Limitations Info  Sight, Hearing, Speech Sight Info: Adequate Hearing Info: Adequate Speech Info: Impaired    SPECIAL CARE FACTORS FREQUENCY  PT (By licensed PT), OT (By licensed OT)     PT Frequency: 5X OT Frequency: 3X            Contractures Contractures Info: Not present    Additional Factors Info  Code Status, Allergies, Isolation Precautions, Suctioning Needs Code Status Info: DNR Allergies Info:  Lisinopril     Isolation Precautions Info: Contact precautions: MRSA Suctioning Needs: q1h   Current Medications (08/27/2018):  This is the current hospital active medication list Current Facility-Administered Medications  Medication Dose Route Frequency Provider Last Rate Last Dose  . acetaminophen (TYLENOL) solution 650 mg  650 mg Per Tube Q8H PRN Loney Hering D, MD   650 mg at 08/27/18 0942  . atorvastatin (LIPITOR) tablet 20 mg  20 mg Per Tube Daily Salary, Montell D, MD   20 mg at 08/27/18 0940  . budesonide (PULMICORT) nebulizer solution 0.25 mg  0.25 mg Nebulization BID Wilhelmina Mcardle, MD   0.25 mg at 08/27/18 9798  . cefdinir (OMNICEF) capsule 300 mg  300 mg Per Tube Q12H Salary, Montell D, MD   300 mg at 08/27/18 0940  . chlorhexidine gluconate (MEDLINE  KIT) (PERIDEX) 0.12 % solution 15 mL  15 mL Mouth Rinse BID Wilhelmina Mcardle, MD   15 mL at 08/27/18 0946  . Chlorhexidine Gluconate Cloth 2 % PADS 6 each  6 each Topical Q0600 Salary, Avel Peace, MD   6 each at 08/27/18 0551  . docusate (COLACE) 50 MG/5ML liquid 100 mg  100 mg Per Tube BID Salary, Montell D, MD   100 mg at 08/27/18 0940  . feeding supplement (OSMOLITE 1.5 CAL) liquid 237 mL   237 mL Per Tube 5 X Daily Wilhelmina Mcardle, MD   237 mL at 08/27/18 1420  . feeding supplement (PRO-STAT SUGAR FREE 64) liquid 30 mL  30 mL Per Tube BID Wilhelmina Mcardle, MD   30 mL at 08/27/18 1230  . free water 200 mL  200 mL Per Tube 5 X Daily Wilhelmina Mcardle, MD   200 mL at 08/27/18 1420  . glycopyrrolate (ROBINUL) injection 0.2 mg  0.2 mg Intravenous Q4H PRN Earlie Counts, NP   0.2 mg at 08/27/18 0546  . guaiFENesin (ROBITUSSIN) 100 MG/5ML solution 100 mg  5 mL Per Tube TID Salary, Montell D, MD   100 mg at 08/27/18 1420  . ipratropium-albuterol (DUONEB) 0.5-2.5 (3) MG/3ML nebulizer solution 3 mL  3 mL Nebulization TID Wilhelmina Mcardle, MD   3 mL at 08/27/18 1410  . MEDLINE mouth rinse  15 mL Mouth Rinse q12n4p Darel Hong D, NP   15 mL at 08/27/18 1230  . morphine CONCENTRATE 10 MG/0.5ML oral solution 2.6 mg  2.6 mg Oral Q2H PRN Earlie Counts, NP   2.6 mg at 08/25/18 2348  . nutrition supplement (JUVEN) (JUVEN) powder packet 1 packet  1 packet Per Tube BID Wilhelmina Mcardle, MD   1 packet at 08/26/18 1810  . ondansetron (ZOFRAN) injection 4 mg  4 mg Intravenous Q6H PRN Salary, Montell D, MD      . pantoprazole sodium (PROTONIX) 40 mg/20 mL oral suspension 40 mg  40 mg Per Tube Q1200 Wilhelmina Mcardle, MD   40 mg at 08/27/18 1420  . polyethylene glycol (MIRALAX / GLYCOLAX) packet 17 g  17 g Oral Daily PRN Salary, Montell D, MD      . pregabalin (LYRICA) capsule 75 mg  75 mg Per Tube BID Earlie Counts, NP   75 mg at 08/27/18 0940  . scopolamine (TRANSDERM-SCOP) 1 MG/3DAYS 1.5 mg  1 patch Transdermal Q72H Flora Lipps, MD   1.5 mg at 08/26/18 2311  . sennosides (SENOKOT) 8.8 MG/5ML syrup 2.5 mL  2.5 mL Per Tube BID Salary, Montell D, MD   2.5 mL at 08/27/18 0940  . trolamine salicylate (ASPERCREME) 10 % cream 1 application  1 application Topical Daily PRN Salary, Avel Peace, MD         Discharge Medications: Please see discharge summary for a list of discharge medications.  Relevant  Imaging Results:  Relevant Lab Results:   Additional Information [id number]  Zettie Pho, LCSW

## 2018-08-27 NOTE — Progress Notes (Addendum)
Admitted yesterday for [facility] [place] at [facility]   PATIENT NAME: Shane Sloan    MR#:  [id number]  DATE OF BIRTH:  May 21, 1951  SUBJECTIVE: Patient is extubated,.  Seen by palliative care.  Aphasic at baseline but appears comfortable.  CHIEF COMPLAINT:   Patient requiring frequent suctioning.  REVIEW OF SYSTEMS:   Review of Systems  Unable to perform ROS: Medical condition     DRUG ALLERGIES:   Allergies  Allergen Reactions  . Lisinopril Hives and Swelling    VITALS:  Blood pressure 118/86, pulse (!) 114, temperature 97.7 F (36.5 C), resp. rate 16, weight 56.7 kg, SpO2 90 %.  PHYSICAL EXAMINATION:  GENERAL:  68 y.o.-year-old patient lying in the bed, currently not intubated EYES: Pupils equal, round, reactive to light a. No scleral icterus. Extraocular muscles intact.  HEENT: Head atraumatic, normocephalic.  Orally intubated.  NECK:  Supple, no jugular venous distention. No thyroid enlargement, no tenderness.  LUNGS: Normal breath sounds bilaterally, no wheezing, rales,rhonchi or crepitation. No use of accessory muscles of respiration.  CARDIOVASCULAR: S1, S2 normal. No murmurs, rubs, or gallops.  ABDOMEN: Soft, nontender, nondistended. Bowel sounds present. No organomegaly or mass.  PEG tube site is not infected. EXTREMITIES: No pedal edema, cyanosis, or clubbing.  NEUROLOGIChas baseline aphasia pSYCHIATRIC: The patient is sleepy this morning not oriented SKIN: No obvious rash, lesion, or ulcer.    LABORATORY PANEL:   CBC Recent Labs  Lab 08/27/18 0400  WBC 11.0*  HGB 10.4*  HCT 32.7*  PLT 233   ------------------------------------------------------------------------------------------------------------------  Chemistries  Recent Labs  Lab 08/22/18 0853 08/23/18 0523  08/27/18 0400  NA 140 144   < > 140  K 3.8 2.9*   < > 3.5  CL 98 108   < > 105  CO2 31 27   < > 30  GLUCOSE 182* 165*   < > 118*  BUN 14 26*   < > 21   CREATININE 0.76 0.95   < > 0.61  CALCIUM 9.2 8.2*   < > 8.3*  MG  --  2.4  --   --   AST 32  --   --   --   ALT 33  --   --   --   ALKPHOS 74  --   --   --   BILITOT 1.0  --   --   --    < > = values in this interval not displayed.   ------------------------------------------------------------------------------------------------------------------  Cardiac Enzymes No results for input(s): TROPONINI in the last 168 hours. ------------------------------------------------------------------------------------------------------------------  RADIOLOGY:  Dg Chest 2 View  Result Date: 08/26/2018 CLINICAL DATA:  Evaluate for airway aspiration. EXAM: CHEST - 2 VIEW COMPARISON:  None. FINDINGS: Normal heart size. There is no pleural effusion or edema identified. Asymmetric opacity within the left lung base is identified compatible with either pneumonia or aspiration. Right lung clear. IMPRESSION: 1. Left base opacity concerning for pneumonia or aspiration. Electronically Signed   By: Signa Kell M.D.   On: 08/26/2018 10:26    EKG:   Orders placed or performed during the hospital encounter of 08/22/18  . ED EKG  . ED EKG  . EKG 12-Lead  . EKG 12-Lead    ASSESSMENT AND PLAN:  67 year old male with history of previous stroke aphasia brought in from nursing home because of hematemesis, patient developed respiratory distress prior to EGD so intubated, transferred to ICU after EGD.  *acute respiratory failure likely secondary to  aspiration pneumonia Successfully extubated, suctioning PRN, aspiration precautions, head of bed at 30 degrees at all time Patient continues to have secretions Continue scopolamine patch Prognosis very poor due to persistent and severe secretion  *Acute left aspiration pneumonia Continue Zosyn for now  *Acute GI bleeding Hemoglobin stable Suspected due to malposition PEG tube which was adjusted by gastroenterology, general surgery did see patient while in  house s/p EGD noted for nonbleeding duodenal ulcer  *Acute on chronic dysphagia with inadequate oral intake Secondary to CVA Continue PEG tube feeding   *Chronic diabetes mellitus type 2 Stable on current regiment  *Chronic hyperlipidemia, unspecified Stable  *Chronic hypertension Controlled on current regiment  *History of CVA 2014 with aphasia, dysphasia s/p G-tube  Stable  Supportive care PRN   *Acute gastric outlet obstruction w/ duodenal ulcer G-tube repositioning by gastroenterology   *hypokalemia Repleted  Disposition back to skilled nursing facility awaiting insurance authorization   Patient seen by palliative care,  recommended DNR status, CODE STATUS changed to DNR. All the records are reviewed and case discussed with Care Management/Social Workerr. Management plans discussed with the patient, family and they are in agreement.  CODE STATUS: DNR  TOTAL TIME TAKING CARE OF THIS PATIENT: 35 minutes.   POSSIBLE D/C IN 1-2 DAYS, DEPENDING ON CLINICAL CONDITION.   Auburn Bilberry M.D on 08/27/2018 at 12:21 PM  Between 7am to 6pm - Pager - [phone]  After 6pm go to [email or web address] - password [email or web address] [facility]  [place] [facility]  Office  [phone]  CC: Primary care physician; Charlott Rakes, MD   Note: This dictation was prepared with Dragon dictation along with smaller phrase technology. Any transcriptional errors that result from this process are unintentional.

## 2018-08-27 NOTE — Clinical Social Work Note (Signed)
Clinical Social Work Assessment  Patient Details  Name: Shane Sloan MRN: [id number] Date of Birth: 1951/04/21  Date of referral:  08/27/18               Reason for consult:  Facility Placement                Permission sought to share information with:  Facility Industrial/product designer granted to share information::  Yes, Verbal Permission Granted  Name::        Agency::  [facility]  Relationship::     Contact Information:     Housing/Transportation Living arrangements for the past 2 months:  Skilled Nursing Facility Source of Information:  Medical Team, Adult Children, Facility Patient Interpreter Needed:  None Criminal Activity/Legal Involvement Pertinent to Current Situation/Hospitalization:  No - Comment as needed Significant Relationships:  Adult Children, Merchandiser, retail Lives with:  Facility Resident Do you feel safe going back to the place where you live?  Yes Need for family participation in patient care:  Yes (Comment)(Patient is non-verbal)  Care giving concerns:  Patient admitted from [facility]   Social Worker assessment / plan:  The CSW saw in chart review that patient admitted from [facility]. The CSW attempted to meet with family and the patient at bedside; no family was available, and the patient was sleeping soundly. The CSW contacted the patient's daughter, Shane Sloan, by phone, to discuss care. Charromay verbalized agreement with return to [facility] once the patient is stable and insurance has authorized such. Charromay asked questions about the process which the CSW answered.  According to [facility], admissions coordinator for [facility], the patient is currently short term rehab to transition to long term care. The patient has suctioning needs q1hr at this time, which may be a barrier to discharge. [facility] will update the CSW if they can accept him with suctioning at that rate. The CSW is  following for discharge facilitation once the patient is stable and the insurance company has authorized. The CSW has sent updated therapy notes to the facility to assist with insurance authorization.  Employment status:  Retired Database administrator, [employer] In [place] PT Recommendations:  Skilled Nursing Facility Information / Referral to community resources:     Patient/Family's Response to care:  The patient's daughter thanked the CSW.  Patient/Family's Understanding of and Emotional Response to Diagnosis, Current Treatment, and Prognosis:  The patient's daughter seems to feel guilty about not being able to be present due to work. She was amenable to emotional support and agrees with the current plan.  Emotional Assessment Appearance:  Appears stated age Attitude/Demeanor/Rapport:  Sedated Affect (typically observed):  Stable Orientation:  Oriented to Self, Oriented to Place, Oriented to  Time, Oriented to Situation Alcohol / Substance use:  Never Used Psych involvement (Current and /or in the community):  No (Comment)  Discharge Needs  Concerns to be addressed:  Care Coordination, Discharge Planning Concerns Readmission within the last 30 days:  No Current discharge risk:  Chronically ill, Physical Impairment Barriers to Discharge:  English as a second language teacher, Continued Medical Work up   [employer], LCSW 08/27/2018, 2:47 PM

## 2018-08-28 LAB — CULTURE, RESPIRATORY W GRAM STAIN

## 2018-08-28 NOTE — Progress Notes (Signed)
Admitted yesterday for [facility] [place] at [facility]   PATIENT NAME: Shane Sloan    MR#:  [id number]  DATE OF BIRTH:  1951-01-02  SUBJECTIVE: Patient is extubated,.  Seen by palliative care.  Aphasic at baseline but appears comfortable.  CHIEF COMPLAINT: She has not required much suctioning since yesterday   REVIEW OF SYSTEMS:   Review of Systems  Unable to perform ROS: Medical condition     DRUG ALLERGIES:   Allergies  Allergen Reactions  . Lisinopril Hives and Swelling    VITALS:  Blood pressure 103/90, pulse 76, temperature 97.7 F (36.5 C), resp. rate 18, weight 56.7 kg, SpO2 95 %.  PHYSICAL EXAMINATION:  GENERAL:  68 y.o.-year-old patient lying in the bed, stable EYES: Pupils equal, round, reactive to light a. No scleral icterus. Extraocular muscles intact.  HEENT: Head atraumatic, normocephalic.  Orally intubated.  NECK:  Supple, no jugular venous distention. No thyroid enlargement, no tenderness.  LUNGS: Normal breath sounds bilaterally, no wheezing, rales,rhonchi or crepitation. No use of accessory muscles of respiration.  CARDIOVASCULAR: S1, S2 normal. No murmurs, rubs, or gallops.  ABDOMEN: Soft, nontender, nondistended. Bowel sounds present. No organomegaly or mass.  PEG tube site is not infected. EXTREMITIES: No pedal edema, cyanosis, or clubbing.  NEUROLOGIChas baseline aphasia pSYCHIATRIC: The patient is sleepy this morning not oriented SKIN: No obvious rash, lesion, or ulcer.    LABORATORY PANEL:   CBC Recent Labs  Lab 08/27/18 0400  WBC 11.0*  HGB 10.4*  HCT 32.7*  PLT 233   ------------------------------------------------------------------------------------------------------------------  Chemistries  Recent Labs  Lab 08/22/18 0853 08/23/18 0523  08/27/18 0400  NA 140 144   < > 140  K 3.8 2.9*   < > 3.5  CL 98 108   < > 105  CO2 31 27   < > 30  GLUCOSE 182* 165*   < > 118*  BUN 14 26*   < > 21   CREATININE 0.76 0.95   < > 0.61  CALCIUM 9.2 8.2*   < > 8.3*  MG  --  2.4  --   --   AST 32  --   --   --   ALT 33  --   --   --   ALKPHOS 74  --   --   --   BILITOT 1.0  --   --   --    < > = values in this interval not displayed.   ------------------------------------------------------------------------------------------------------------------  Cardiac Enzymes No results for input(s): TROPONINI in the last 168 hours. ------------------------------------------------------------------------------------------------------------------  RADIOLOGY:  No results found.  EKG:   Orders placed or performed during the hospital encounter of 08/22/18  . ED EKG  . ED EKG  . EKG 12-Lead  . EKG 12-Lead    ASSESSMENT AND PLAN:  68 year old male with history of previous stroke aphasia brought in from nursing home because of hematemesis, patient developed respiratory distress prior to EGD so intubated, transferred to ICU after EGD.  *acute respiratory failure likely secondary to aspiration pneumonia Successfully extubated, suctioning PRN, aspiration precautions, head of bed at 30 degrees at all time Secretions improved Continue scopolamine patch Prognosis very poor   *Acute left aspiration pneumonia Change to Augmentin  *Acute GI bleeding Hemoglobin stable Suspected due to malposition PEG tube which was adjusted by gastroenterology, general surgery did see patient while in house s/p EGD noted for nonbleeding duodenal ulcer Hemoglobin stable  *Acute on chronic dysphagia with inadequate oral  intake Secondary to CVA Continue PEG tube feeding   *Chronic diabetes mellitus type 2 Stable on current regiment  *Chronic hyperlipidemia, unspecified Stable  *Chronic hypertension Controlled on current regiment  *History of CVA 2014 with aphasia, dysphasia s/p G-tube  Stable  Supportive care PRN   *Acute gastric outlet obstruction w/ duodenal ulcer G-tube repositioning by  gastroenterology   *hypokalemia Repleted  Disposition back to skilled nursing facility awaiting insurance authorization   Patient seen by palliative care,  recommended DNR status, CODE STATUS changed to DNR. All the records are reviewed and case discussed with Care Management/Social Workerr. Management plans discussed with the patient, family and they are in agreement.  CODE STATUS: DNR  TOTAL TIME TAKING CARE OF THIS PATIENT: 35 minutes.   POSSIBLE D/C IN 1-2 DAYS, DEPENDING ON CLINICAL CONDITION.   Auburn Bilberry M.D on 08/28/2018 at 12:09 PM  Between 7am to 6pm - Pager - [phone]  After 6pm go to [email or web address] - password [email or web address] [facility]  [place] [facility]  Office  [phone]  CC: Primary care physician; Charlott Rakes, MD   Note: This dictation was prepared with Dragon dictation along with smaller phrase technology. Any transcriptional errors that result from this process are unintentional.

## 2018-08-28 NOTE — Progress Notes (Signed)
While suctioning pt this AM, noticed blood in suction. On assessment found tongue to red and raw. Lovell Sheehan away from patient and informed Cabin crew.

## 2018-08-29 MED ORDER — BUDESONIDE 0.25 MG/2ML IN SUSP: 0.2500 mg | Freq: Two times a day (BID) | RESPIRATORY_TRACT | 12 refills | Status: AC

## 2018-08-29 MED ORDER — FREE WATER: 200.0000 mL | Freq: Every day | Status: AC

## 2018-08-29 MED ORDER — OSMOLITE 1.5 CAL PO LIQD: 237.0000 mL | Freq: Every day | ORAL | 0 refills | Status: AC

## 2018-08-29 MED ORDER — IPRATROPIUM-ALBUTEROL 0.5-2.5 (3) MG/3ML IN SOLN: 3.0000 mL | Freq: Three times a day (TID) | RESPIRATORY_TRACT | Status: AC

## 2018-08-29 MED ORDER — PRO-STAT SUGAR FREE PO LIQD: 30.0000 mL | Freq: Two times a day (BID) | ORAL | 0 refills | Status: AC

## 2018-08-29 MED ORDER — SCOPOLAMINE 1 MG/3DAYS TD PT72: 1.0000 | MEDICATED_PATCH | TRANSDERMAL | 12 refills | Status: AC

## 2018-08-29 NOTE — Discharge Summary (Signed)
[facility] [place] at [facility], 68 y.o., DOB 1950-07-11, MRN [id number]. Admission date: 08/22/2018 Discharge Date 08/29/2018 Primary MD Charlott Rakes, MD Admitting Physician Bertrum Sol, MD  Admission Diagnosis  Acute respiratory failure with hypoxia (HCC) [J96.01] Hematemesis with nausea [K92.0]  Discharge Diagnosis   Active Problems: Acute respiratory failure due to aspiration pneumonia Acute GI bleed due to malpositioned PEG tube now resolved Acute on chronic dysphasia Diabetes type 2 Chronic hyperlipidemia Essential hypertension Hypokalemia    Hospital Course  Shane Sloan  is a 68 y.o. male with a known history per below which includes history of CVA with chronic aphasia/PEG tube placement/bedbound state, presenting from local nursing home with vomiting of blood, patient noted to be hypoxic with O2 saturation in the 80s, ER evaluation noted for lactic acid 2.9, chest x-ray noted for atelectasis versus pneumonia, large bloody emesis noted in the emergency room which is dark in color.  Patient was admitted and treated for aspiration pneumonia.  He was also seen by GI for possible upper GI bleed.  He was thought to have a possible malpositioned PEG tube causing the bleeding.  This was worked on by GI physician.  Patient also had significant secretions which have now dramatically decreased.  We will continue scopolamine.            Consults  GI  Significant Tests:  See full reports for all details     Dg Chest 2 View  Result Date: 08/26/2018 CLINICAL DATA:  Evaluate for airway aspiration. EXAM: CHEST - 2 VIEW COMPARISON:  None. FINDINGS: Normal heart size. There is no pleural effusion or edema identified. Asymmetric opacity within the left lung base is identified compatible with either pneumonia or aspiration. Right lung clear. IMPRESSION: 1. Left base opacity concerning for pneumonia or aspiration. Electronically Signed   By:  Signa Kell M.D.   On: 08/26/2018 10:26   Dg Chest 2 View  Result Date: 08/22/2018 CLINICAL DATA:  Hypoxia.  Vomiting blood. EXAM: CHEST - 2 VIEW COMPARISON:  None. FINDINGS: Low volume chest with interstitial crowding. Could not exclude mild infiltrate or atelectasis in the retrocardiac lung. No edema, effusion, or pneumothorax. Normal heart size.Apparent percutaneous gastrostomy tube. The stomach is likely distended by gas and fluid. Hemostatic clips versus cholecystectomy clips overlap the right upper quadrant. IMPRESSION: Mild atelectasis or infiltrate in the retrocardiac lung. Electronically Signed   By: Marnee Spring M.D.   On: 08/22/2018 10:37   Dg Chest Port 1 View  Result Date: 08/23/2018 CLINICAL DATA:  Respiratory failure.  Follow-up exam. EXAM: PORTABLE CHEST 1 VIEW COMPARISON:  08/22/2018 FINDINGS: Endotracheal tube tip now projects 2.8 cm above the Carina, well positioned. There is persistent opacity at the medial left lung base consistent with pneumonia or atelectasis. Lungs demonstrate prominent bronchovascular markings diffusely but are otherwise clear. No pleural effusion or pneumothorax. IMPRESSION: 1. Well-positioned endotracheal tube. 2. Persistent left medial lung base opacity consistent with atelectasis or pneumonia. Electronically Signed   By: Amie Portland M.D.   On: 08/23/2018 07:42   Dg Chest Port 1 View  Result Date: 08/22/2018 CLINICAL DATA:  Status post intubation EXAM: PORTABLE CHEST 1 VIEW COMPARISON:  August 22, 2018 FINDINGS: The ET tube is been placed, terminating at the carina. Recommend withdrawing 3 cm. The cardiomediastinal silhouette is stable. No pneumothorax. Mild opacity in left base is slightly more prominent the interval. No other interval changes. IMPRESSION: 1. The ETT terminates at the carina.  Recommend withdrawing  3 cm. 2. Increased opacity in left base may represent atelectasis or infiltrate. Recommend attention on follow-up. 3. No other acute  abnormalities. These results will be called to the ordering clinician or representative by the Radiologist Assistant, and communication documented in the PACS or zVision Dashboard. Electronically Signed   By: Gerome Sam III M.D   On: 08/22/2018 16:28       Today   Subjective:   Shane Sloan patient more verbal today Objective:   Blood pressure 120/80, pulse 78, temperature 98 F (36.7 C), temperature source Oral, resp. rate 17, weight 56.7 kg, SpO2 95 %.  .  Intake/Output Summary (Last 24 hours) at 08/29/2018 1246 Last data filed at 08/29/2018 1231 Gross per 24 hour  Intake -  Output 2450 ml  Net -2450 ml    Exam VITAL SIGNS: Blood pressure 120/80, pulse 78, temperature 98 F (36.7 C), temperature source Oral, resp. rate 17, weight 56.7 kg, SpO2 95 %.  GENERAL:  68 y.o.-year-old patient lying in the bed with no acute distress.  EYES: Pupils equal, round, reactive to light and accommodation. No scleral icterus. Extraocular muscles intact.  HEENT: Head atraumatic, normocephalic. Oropharynx and nasopharynx clear.  NECK:  Supple, no jugular venous distention. No thyroid enlargement, no tenderness.  LUNGS: Normal breath sounds bilaterally, no wheezing, rales,rhonchi or crepitation. No use of accessory muscles of respiration.  CARDIOVASCULAR: S1, S2 normal. No murmurs, rubs, or gallops.  ABDOMEN: Soft, nontender, nondistended. Bowel sounds present. No organomegaly or mass.  EXTREMITIES: No pedal edema, cyanosis, or clubbing.  NEUROLOGIC: Limited PSYCHIATRIC: The patient is alert  SKIN: No obvious rash, lesion, or ulcer.   Data Review     CBC w Diff:  Lab Results  Component Value Date   WBC 11.0 (H) 08/27/2018   HGB 10.4 (L) 08/27/2018   HCT 32.7 (L) 08/27/2018   PLT 233 08/27/2018   LYMPHOPCT 17 08/22/2018   MONOPCT 2 08/22/2018   EOSPCT 0 08/22/2018   BASOPCT 0 08/22/2018   CMP:  Lab Results  Component Value Date   NA 140 08/27/2018   K 3.5 08/27/2018   CL  105 08/27/2018   CO2 30 08/27/2018   BUN 21 08/27/2018   CREATININE 0.61 08/27/2018   CREATININE 0.87 05/09/2018   PROT 7.9 08/22/2018   ALBUMIN 4.2 08/22/2018   BILITOT 1.0 08/22/2018   ALKPHOS 74 08/22/2018   AST 32 08/22/2018   ALT 33 08/22/2018  .  Micro Results Recent Results (from the past 240 hour(s))  MRSA PCR Screening     Status: Abnormal   Collection Time: 08/22/18  1:16 PM  Result Value Ref Range Status   MRSA by PCR POSITIVE (A) NEGATIVE Final    Comment:        The GeneXpert MRSA Assay (FDA approved for NASAL specimens only), is one component of a comprehensive MRSA colonization surveillance program. It is not intended to diagnose MRSA infection nor to guide or monitor treatment for MRSA infections. CRITICAL RESULT CALLED TO, READ BACK BY AND VERIFIED WITH: CALLED TO JUAN RODRIGUEZ @1455  08/22/2018 [facility] Performed at [facility], [address] [place], [place] [address]   Culture, blood (routine x 2) Call MD if unable to obtain prior to antibiotics being given     Status: None   Collection Time: 08/22/18  1:41 PM  Result Value Ref Range Status   Specimen Description BLOOD BLOOD LEFT HAND  Final   Special Requests   Final    BOTTLES DRAWN  AEROBIC AND ANAEROBIC Blood Culture adequate volume   Culture   Final    NO GROWTH 5 DAYS Performed at [facility], [address] [place], [place] [address]    Report Status 08/27/2018 FINAL  Final  Culture, blood (routine x 2) Call MD if unable to obtain prior to antibiotics being given     Status: None   Collection Time: 08/22/18  1:41 PM  Result Value Ref Range Status   Specimen Description BLOOD BLOOD LEFT WRIST  Final   Special Requests   Final    BOTTLES DRAWN AEROBIC AND ANAEROBIC Blood Culture adequate volume   Culture   Final    NO GROWTH 5 DAYS Performed at [facility], [address], [place], [place] [address]    Report Status 08/27/2018 FINAL  Final  Culture,  respiratory     Status: None   Collection Time: 08/22/18  4:35 PM  Result Value Ref Range Status   Specimen Description   Final    TRACHEAL ASPIRATE Performed at [facility], [address] [place], [place] [address]    Special Requests   Final    NONE Performed at [facility], [address] [place], [place] [address]    Gram Stain   Final    FEW WBC PRESENT, PREDOMINANTLY PMN MODERATE YEAST Performed at [facility], [address], [place], [place] [address]    Culture FEW ENTEROBACTER AEROGENES FEW CANDIDA GLABRATA   Final   Report Status 08/28/2018 FINAL  Final   Organism ID, Bacteria ENTEROBACTER AEROGENES  Final      Susceptibility   Enterobacter aerogenes - MIC*    CEFAZOLIN >=64 RESISTANT Resistant     CEFEPIME <=1 SENSITIVE Sensitive     CEFTAZIDIME <=1 SENSITIVE Sensitive     CEFTRIAXONE <=1 SENSITIVE Sensitive     CIPROFLOXACIN <=0.25 SENSITIVE Sensitive     GENTAMICIN <=1 SENSITIVE Sensitive     IMIPENEM 1 SENSITIVE Sensitive     TRIMETH/SULFA <=20 SENSITIVE Sensitive     PIP/TAZO <=4 SENSITIVE Sensitive     * FEW ENTEROBACTER AEROGENES        Code Status Orders  (From admission, onward)         Start     Ordered   08/24/18 1210  Do not attempt resuscitation (DNR)  Continuous    Question Answer Comment  In the event of cardiac or respiratory ARREST Do not call a "code blue"   In the event of cardiac or respiratory ARREST Do not perform Intubation, CPR, defibrillation or ACLS   In the event of cardiac or respiratory ARREST Use medication by any route, position, wound care, and other measures to relive pain and suffering. May use oxygen, suction and manual treatment of airway obstruction as needed for comfort.      08/24/18 1209        Code Status History    Date Active Date Inactive Code Status Order ID Comments User Context   08/22/2018 1202 08/24/2018 1209 Full Code [id number]  Bertrum Sol, MD ED   08/22/2018 1202  08/22/2018 1202 Full Code [id number]  Salary, Evelena Asa, MD ED    Advance Directive Documentation     Most Recent Value  Type of Advance Directive  Healthcare Power of Attorney  Pre-existing out of facility DNR order (yellow form or pink MOST form)  -  "MOST" Form in Place?  -  Contact information for after-discharge care    Destination    [facility] CARE Preferred SNF .   Service:  Skilled Nursing Contact information: [address] [place] [address] [phone]              Discharge Medications   Allergies as of 08/29/2018      Reactions   Lisinopril Hives, Swelling      Medication List    TAKE these medications   Acetaminophen 650 MG/20.3ML Susp Place 650 mg into feeding tube every 8 (eight) hours as needed for fever.   atorvastatin 20 MG tablet Commonly known as:  LIPITOR Take 1 tablet (20 mg total) by mouth daily. What changed:  how to take this   budesonide 0.25 MG/2ML nebulizer solution Commonly known as:  PULMICORT Take 2 mLs (0.25 mg total) by nebulization 2 (two) times daily.   docusate 50 MG/5ML liquid Commonly known as:  COLACE Place 100 mg into feeding tube 2 (two) times daily.   feeding supplement (OSMOLITE 1.5 CAL) Liqd Place 237 mLs into feeding tube 5 (five) times daily.   feeding supplement (PRO-STAT SUGAR FREE 64) Liqd Place 30 mLs into feeding tube 2 (two) times daily.   free water Soln Place 200 mLs into feeding tube 5 (five) times daily.   insulin regular 100 units/mL injection Commonly known as:  NOVOLIN R,HUMULIN R Inject 2-12 Units into the skin 4 (four) times daily -  before meals and at bedtime. slinding scale: 150-200=2; 201-250=4; 251-300=6; 301-350=8; 351-400=10; 401+=12 notify MD if BS is greater than 400   ipratropium-albuterol 0.5-2.5 (3) MG/3ML Soln Commonly known as:  DUONEB Take 3 mLs by nebulization 3 (three) times daily.   nystatin 100000 UNIT/ML suspension Commonly known  as:  MYCOSTATIN Take 5 mLs by mouth 4 (four) times daily. For thrush for 14 days, use pink sponge to apply to tongue   pantoprazole sodium 40 mg/20 mL Pack Commonly known as:  PROTONIX 40 mg by Gastric Tube route 2 (two) times daily.   pregabalin 50 MG capsule Commonly known as:  LYRICA Place 50 mg into feeding tube 2 (two) times daily.   scopolamine 1 MG/3DAYS Commonly known as:  TRANSDERM-SCOP Place 1 patch (1.5 mg total) onto the skin every 3 (three) days.   sennosides 8.8 MG/5ML syrup Commonly known as:  SENOKOT Place 2.5 mLs into feeding tube 2 (two) times daily.   terazosin 1 MG capsule Commonly known as:  HYTRIN Place 1 mg into feeding tube at bedtime.   trolamine salicylate 10 % cream Commonly known as:  ASPERCREME Apply 1 application topically daily as needed (feet pain).          Total Time in preparing paper work, data evaluation and todays exam - 35 minutes  Auburn Bilberry M.D on 08/29/2018 at 12:46 PM [facility]   Office  [phone]

## 2018-08-29 NOTE — Care Management Important Message (Signed)
Important Message  Patient Details  Name: Shane Sloan MRN: [id number] Date of Birth: 28-Mar-1951   Medicare Important Message Given:  Yes    Eber Hong, RN 08/29/2018, 7:41 PM

## 2018-08-30 NOTE — Clinical Social Work Note (Signed)
Patient is medically ready for discharge today. Per Tresa Endo at [employer] patient was denied by [facility] but they can accept him under Medicaid. CSW notified patient's daughter Sabino Evelyn [phone] of discharge today. Patient will be transported by EMS. RN to call report and call for transport.   Ruthe Mannan MSW, [address] [phone]

## 2018-08-30 NOTE — Progress Notes (Signed)
Physical Therapy Treatment Patient Details Name: Maxden Hailu MRN: [id number] DOB: Jul 10, 1950 Today's Date: 08/30/2018    History of Present Illness Pt is a 68 y.o male who arrived to the ED from nursing home with hematemisis and SpO2 in 80's. Pt was intubated 2/17. EGD performed while intubated and per chart review hematemisis likely due to malpositioning of chronic G tube. Obstruction removed and pt extubated. Pt with PMHx of CVA, metabolic encephalopathy, anemia, HTN, blood clots, DM, and expressive aphasia.     PT Comments    Pt progressed towards his goals this session as he was able to maintain static sitting without use of UE for 5 min without LOB. RN present taking vitals upon SPT entering room. SpO2 in supine 84%, RN educated pt on pursed lip breathing and SpO2 rose to 90%. Min A for sup>sit for trunk support. SpO2 remained at or above 91% in sitting. Mod A for trunk and BLE support with sit>sup. Of note pt's eyes became watery, pt unable to state if it was due to pain, RN notified. Current plan remains appropriate.    Follow Up Recommendations  SNF     Equipment Recommendations  Other (comment)(TBD at next venue )    Recommendations for Other Services       Precautions / Restrictions Precautions Precautions: Fall Precaution Comments: BP, SpO2  Restrictions Weight Bearing Restrictions: No    Mobility  Bed Mobility Overal bed mobility: Needs Assistance Bed Mobility: Supine to Sit;Sit to Supine     Supine to sit: Min assist;HOB elevated Sit to supine: Mod assist;HOB elevated   General bed mobility comments: RN present taking vitals upon SPT entering room. SpO2 in supine 84%, RN educated pt on pursed lip breathing and SpO2 rose to 90%. Min A for sup>sit for trunk support. SpO2 remained at or above 91% in sitting. Pt maintained static sitting balance for 5 min without LOB. Mod A for trunk and BLE support with sit>sup.   Transfers                 General  transfer comment: unable to attempt due to global weakness.   Ambulation/Gait                 Stairs             Wheelchair Mobility    Modified Rankin (Stroke Patients Only)       Balance Overall balance assessment: Needs assistance Sitting-balance support: No upper extremity supported;Feet supported Sitting balance-Leahy Scale: Fair Sitting balance - Comments: Pt able to maintain static sitting balance without use of UE but challenged with dynamic movement.                                     Cognition Arousal/Alertness: Awake/alert Behavior During Therapy: Anxious;WFL for tasks assessed/performed Overall Cognitive Status: Difficult to assess Area of Impairment: Safety/judgement;Awareness;Problem solving;Attention;Memory;Following commands                   Current Attention Level: Focused Memory: Decreased short-term memory Following Commands: Follows one step commands with increased time Safety/Judgement: Decreased awareness of safety Awareness: Intellectual Problem Solving: Slow processing;Decreased initiation;Difficulty sequencing;Requires verbal cues;Requires tactile cues General Comments: Pt able to follow one step commands more consistently this session.      Exercises Other Exercises Other Exercises: pt sat at EOB with BLE feet support and no UE support for 5 min without any  LOB and min guard. Pt instructed to reach with UE in which pt became unsteady but was able to correct with Min A.     General Comments General comments (skin integrity, edema, etc.): Pt able to indicate when he needed suctioning and was able to perform independently.      Pertinent Vitals/Pain Pain Assessment: Faces Faces Pain Scale: Hurts a little bit Pain Location: pt unable to report, SPT unsure if crying due to pain or frustration Pain Descriptors / Indicators: Crying Pain Intervention(s): Limited activity within patient's tolerance;Monitored  during session    Home Living                      Prior Function            PT Goals (current goals can now be found in the care plan section) Acute Rehab PT Goals Patient Stated Goal: unable to state  PT Goal Formulation: Patient unable to participate in goal setting Time For Goal Achievement: 09/08/18 Potential to Achieve Goals: Fair Progress towards PT goals: Progressing toward goals    Frequency    Min 2X/week      PT Plan Current plan remains appropriate    Co-evaluation              AM-PAC PT "6 Clicks" Mobility   Outcome Measure  Help needed turning from your back to your side while in a flat bed without using bedrails?: A Little Help needed moving from lying on your back to sitting on the side of a flat bed without using bedrails?: A Lot Help needed moving to and from a bed to a chair (including a wheelchair)?: Total Help needed standing up from a chair using your arms (e.g., wheelchair or bedside chair)?: Total Help needed to walk in hospital room?: Total Help needed climbing 3-5 steps with a railing? : Total 6 Click Score: 9    End of Session Equipment Utilized During Treatment: Other (comment)(suction) Activity Tolerance: Patient tolerated treatment well Patient left: in bed;with call bell/phone within reach;with nursing/sitter in room(2 RN) Nurse Communication: Mobility status;Other (comment)(bed alarm off due to RNs assessing pressure points) PT Visit Diagnosis: Muscle weakness (generalized) (M62.81);Difficulty in walking, not elsewhere classified (R26.2)     Time: 8616-8372 PT Time Calculation (min) (ACUTE ONLY): 21 min  Charges:  $Therapeutic Activity: 8-22 mins                       Emeline Gins, SPT  08/30/2018, 12:06 PM

## 2018-08-30 NOTE — Progress Notes (Signed)
Pt to go to [facility] via ems. Alert.  Cont to have secretions at times and suctioned with yankar at intevals. Larry from resp  Notified to come deep suction but he refused to let him suction him. tol tube feedings

## 2018-08-30 NOTE — Progress Notes (Signed)
Ad[facility] [place] [facility]   PATIENT NAME: Shane Sloan    MR#:  [id number]  DATE OF BIRTH:  1950/11/01  SUBJECTIVE: Patient is extubated,.  Seen by palliative care.  Aphasic at baseline but appears comfortable.  CHIEF COMPLAINT: Has been told by social worker that patient has not received insurance authorization  REVIEW OF SYSTEMS:   Review of Systems  Unable to perform ROS: Medical condition     DRUG ALLERGIES:   Allergies  Allergen Reactions  . Lisinopril Hives and Swelling    VITALS:  Blood pressure (!) 143/76, pulse 87, temperature 98.1 F (36.7 C), temperature source Oral, resp. rate 20, weight 56.7 kg, SpO2 91 %.  PHYSICAL EXAMINATION:  GENERAL:  68 y.o.-year-old patient lying in the bed, stable EYES: Pupils equal, round, reactive to light a. No scleral icterus. Extraocular muscles intact.  HEENT: Head atraumatic, normocephalic.  Orally intubated.  NECK:  Supple, no jugular venous distention. No thyroid enlargement, no tenderness.  LUNGS: Normal breath sounds bilaterally, no wheezing, rales,rhonchi or crepitation. No use of accessory muscles of respiration.  CARDIOVASCULAR: S1, S2 normal. No murmurs, rubs, or gallops.  ABDOMEN: Soft, nontender, nondistended. Bowel sounds present. No organomegaly or mass.  PEG tube site is not infected. EXTREMITIES: No pedal edema, cyanosis, or clubbing.  NEUROLOGIChas baseline aphasia pSYCHIATRIC: The patient is sleepy this morning not oriented SKIN: No obvious rash, lesion, or ulcer.    LABORATORY PANEL:   CBC Recent Labs  Lab 08/27/18 0400  WBC 11.0*  HGB 10.4*  HCT 32.7*  PLT 233   ------------------------------------------------------------------------------------------------------------------  Chemistries  Recent Labs  Lab 08/27/18 0400  NA 140  K 3.5  CL 105  CO2 30  GLUCOSE 118*  BUN 21  CREATININE 0.61  CALCIUM 8.3*    ------------------------------------------------------------------------------------------------------------------  Cardiac Enzymes No results for input(s): TROPONINI in the last 168 hours. ------------------------------------------------------------------------------------------------------------------  RADIOLOGY:  No results found.  EKG:   Orders placed or performed during the hospital encounter of 08/22/18  . ED EKG  . ED EKG  . EKG 12-Lead  . EKG 12-Lead    ASSESSMENT AND PLAN:  68 year old male with history of previous stroke aphasia brought in from nursing home because of hematemesis, patient developed respiratory distress prior to EGD so intubated, transferred to ICU after EGD.  *acute respiratory failure likely secondary to aspiration pneumonia Successfully extubated, suctioning PRN, aspiration precautions, head of bed at 30 degrees at all time Secretions improved Continue scopolamine patch Prognosis very poor   *Acute left aspiration pneumonia Continue Augmentin  *Acute GI bleeding Hemoglobin stable Suspected due to malposition PEG tube which was adjusted by gastroenterology, general surgery did see patient while in house s/p EGD noted for nonbleeding duodenal ulcer Hemoglobin stable  *Acute on chronic dysphagia with inadequate oral intake Secondary to CVA Continue PEG tube feeding   *Chronic diabetes mellitus type 2 Stable on current regiment  *Chronic hyperlipidemia, unspecified Stable  *Chronic hypertension Controlled on current regiment  *History of CVA 2014 with aphasia, dysphasia s/p G-tube  Stable  Supportive care PRN   *Acute gastric outlet obstruction w/ duodenal ulcer G-tube repositioning by gastroenterology   *hypokalemia Repleted  Disposition back to skilled nursing facility awaiting insurance authorization   Patient seen by palliative care,  recommended DNR status, CODE STATUS changed to DNR. All the records are reviewed and  case discussed with Care Management/Social Workerr. Management plans discussed with the patient, family and they are in agreement.  CODE  STATUS: DNR  TOTAL TIME TAKING CARE OF THIS PATIENT: 25 minutes.   POSSIBLE D/C IN 1-2 DAYS, DEPENDING ON CLINICAL CONDITION.   Auburn Bilberry M.D on 08/30/2018 at 12:28 PM  Between 7am to 6pm - Pager - [phone]  After 6pm go to [email or web address] - password [email or web address]  [facility]  Office  [phone]  CC: Primary care physician; Charlott Rakes, MD   Note: This dictation was prepared with Dragon dictation along with smaller phrase technology. Any transcriptional errors that result from this process are unintentional.

## 2018-08-30 NOTE — Progress Notes (Signed)
Ems here to transport to [facility]  Report called earlier  To tailia at [facility]. Sl d/cd

## 2018-08-30 NOTE — Progress Notes (Signed)
Nutrition Follow-up  DOCUMENTATION CODES:   Not applicable  INTERVENTION:  Continue bolus regimen of Osmolite 1.5 Cal 1 can (237 mL) 5 times daily + Pro-Stat 30 mL BID per G-tube. Provides 1975 kcal, 105 grams of protein, 905 mL H2O daily from tube feeding.  Continue Juven pack BID per G-tube to promote wound healing.  Continue to flush tube with 60 mL before each bolus feeding and 90 mL after each bolus feeding. Patient will receive 90 mL water with each Pro-Stat provision (30 mL flush, watered down with 30 mL, 30 mL flush). Patient will receive 180 mL water with each Juven provision (30 mL flush, mixed with 120 mL, 30 mL flush). This is a total of approximately 2195 mL daily including water in tube feeding. Patient will also receive water with medication provision.  NUTRITION DIAGNOSIS:   Inadequate oral intake related to inability to eat, dysphagia as evidenced by NPO status, other (comment)(reliance on G-tube for nutrition/hydration needs).  Ongoing - addressing with TF regimen.  GOAL:   Patient will meet greater than or equal to 90% of their needs  Met with TF regimen.  MONITOR:   Weight trends, TF tolerance, Skin, I & O's  REASON FOR ASSESSMENT:   Malnutrition Screening Tool, Consult Assessment of nutrition requirement/status  ASSESSMENT:   67 year old male with PMHx of asthma, DM, HTN, HLD, hx CVA 05/31/2013, aphasia, dysphagia s/p G-tube placement, GERD, iron deficiency anemia who is admitted from [facility] with hematemesis, underwent EGD 2/17 which found G-tube had caused gastric outlet obstruction and duodenal ulcer now s/p repositioning by GI.   Saw patient in room and discussed with RN. Patient continues to tolerate usual bolus TF regimen well. Per skin documentation in chart patient's unstageable pressure ulcer to buttocks now in early/partial granulation stage of healing. Having type 6 BMs per chart.  Enteral Access: 16 Fr. Kangaroo G-tube with 20 cc  balloon  Medications reviewed and include: Colace 100 mg BID per tube, pantoprazole, sennosides 2.5 mL BID per tube.  Labs reviewed.  Diet Order:   Diet Order            Diet general        Diet NPO time specified  Diet effective now             EDUCATION NEEDS:   No education needs have been identified at this time  Skin:  Skin Assessment: Skin Integrity Issues:(unstageable to left buttocks; MSAD to perineum)  Last BM:  08/29/2018 - medium type 6  Height:   Ht Readings from Last 1 Encounters:  05/09/18 5' 6" (1.676 m)   Weight:   Wt Readings from Last 1 Encounters:  08/23/18 56.7 kg   Ideal Body Weight:  64.5 kg(calculated w/ ht of 5' 6" from previous encounter)  BMI:  Body mass index is 20.18 kg/m.  Estimated Nutritional Needs:   Kcal:  1700-1980 (30-35 kcal/kg)  Protein:  85-115 grams (1.5-2 grams/kg)  Fluid:  1.7-2 L/day (30-35 mL/kg)  Leanne Stephens, MS, RD, LDN Office: [phone] Pager: [phone] After Hours/Weekend Pager: [phone]  

## 2018-08-30 NOTE — Discharge Summary (Signed)
[facility] [place] [facility], 68 y.o., DOB [birth year], MRN [id number]. Admission date: 08/22/2018 Discharge Date 08/30/2018 Primary MD Charlott Rakes, MD Admitting Physician Bertrum Sol, MD  Admission Diagnosis  Acute respiratory failure with hypoxia (HCC) [J96.01] Hematemesis with nausea [K92.0]  Discharge Diagnosis   Active Problems: Acute respiratory failure due to aspiration pneumonia Acute GI bleed due to malpositioned PEG tube now resolved Acute on chronic dysphasia Diabetes type 2 Chronic hyperlipidemia Essential hypertension Hypokalemia    Hospital Course  Shane Sloan  is a 68 y.o. male with a known history per below which includes history of CVA with chronic aphasia/PEG tube placement/bedbound state, presenting from local nursing home with vomiting of blood, patient noted to be hypoxic with O2 saturation in the 80s, ER evaluation noted for lactic acid 2.9, chest x-ray noted for atelectasis versus pneumonia, large bloody emesis noted in the emergency room which is dark in color.  Patient was admitted and treated for aspiration pneumonia.  He was also seen by GI for possible upper GI bleed.  He was thought to have a possible malpositioned PEG tube causing the bleeding.  This was worked on by GI physician.  Patient also had significant secretions which have now dramatically decreased.  We will continue scopolamine.            Consults  GI  Significant Tests:  See full reports for all details     Dg Chest 2 View  Result Date: 08/26/2018 CLINICAL DATA:  Evaluate for airway aspiration. EXAM: CHEST - 2 VIEW COMPARISON:  None. FINDINGS: Normal heart size. There is no pleural effusion or edema identified. Asymmetric opacity within the left lung base is identified compatible with either pneumonia or aspiration. Right lung clear. IMPRESSION: 1. Left base opacity concerning for pneumonia or aspiration. Electronically Signed   By:  Signa Kell M.D.   On: 08/26/2018 10:26   Dg Chest 2 View  Result Date: 08/22/2018 CLINICAL DATA:  Hypoxia.  Vomiting blood. EXAM: CHEST - 2 VIEW COMPARISON:  None. FINDINGS: Low volume chest with interstitial crowding. Could not exclude mild infiltrate or atelectasis in the retrocardiac lung. No edema, effusion, or pneumothorax. Normal heart size.Apparent percutaneous gastrostomy tube. The stomach is likely distended by gas and fluid. Hemostatic clips versus cholecystectomy clips overlap the right upper quadrant. IMPRESSION: Mild atelectasis or infiltrate in the retrocardiac lung. Electronically Signed   By: Marnee Spring M.D.   On: 08/22/2018 10:37   Dg Chest Port 1 View  Result Date: 08/23/2018 CLINICAL DATA:  Respiratory failure.  Follow-up exam. EXAM: PORTABLE CHEST 1 VIEW COMPARISON:  08/22/2018 FINDINGS: Endotracheal tube tip now projects 2.8 cm above the Carina, well positioned. There is persistent opacity at the medial left lung base consistent with pneumonia or atelectasis. Lungs demonstrate prominent bronchovascular markings diffusely but are otherwise clear. No pleural effusion or pneumothorax. IMPRESSION: 1. Well-positioned endotracheal tube. 2. Persistent left medial lung base opacity consistent with atelectasis or pneumonia. Electronically Signed   By: Amie Portland M.D.   On: 08/23/2018 07:42   Dg Chest Port 1 View  Result Date: 08/22/2018 CLINICAL DATA:  Status post intubation EXAM: PORTABLE CHEST 1 VIEW COMPARISON:  August 22, 2018 FINDINGS: The ET tube is been placed, terminating at the carina. Recommend withdrawing 3 cm. The cardiomediastinal silhouette is stable. No pneumothorax. Mild opacity in left base is slightly more prominent the interval. No other interval changes. IMPRESSION: 1. The ETT terminates at the carina.  Recommend withdrawing  3 cm. 2. Increased opacity in left base may represent atelectasis or infiltrate. Recommend attention on follow-up. 3. No other acute  abnormalities. These results will be called to the ordering clinician or representative by the Radiologist Assistant, and communication documented in the PACS or zVision Dashboard. Electronically Signed   By: Gerome Sam III M.D   On: 08/22/2018 16:28       Today   Subjective:   Shane Sloan patient more verbal today Objective:   Blood pressure (!) 143/76, pulse 87, temperature 98.1 F (36.7 C), temperature source Oral, resp. rate 20, weight 56.7 kg, SpO2 91 %.  .  Intake/Output Summary (Last 24 hours) at 08/30/2018 1626 Last data filed at 08/29/2018 2210 Gross per 24 hour  Intake 237 ml  Output 650 ml  Net -413 ml    Exam VITAL SIGNS: Blood pressure (!) 143/76, pulse 87, temperature 98.1 F (36.7 C), temperature source Oral, resp. rate 20, weight 56.7 kg, SpO2 91 %.  GENERAL:  68 y.o.-year-old patient lying in the bed with no acute distress.  EYES: Pupils equal, round, reactive to light and accommodation. No scleral icterus. Extraocular muscles intact.  HEENT: Head atraumatic, normocephalic. Oropharynx and nasopharynx clear.  NECK:  Supple, no jugular venous distention. No thyroid enlargement, no tenderness.  LUNGS: Normal breath sounds bilaterally, no wheezing, rales,rhonchi or crepitation. No use of accessory muscles of respiration.  CARDIOVASCULAR: S1, S2 normal. No murmurs, rubs, or gallops.  ABDOMEN: Soft, nontender, nondistended. Bowel sounds present. No organomegaly or mass.  EXTREMITIES: No pedal edema, cyanosis, or clubbing.  NEUROLOGIC: Limited PSYCHIATRIC: The patient is alert  SKIN: No obvious rash, lesion, or ulcer.   Data Review     CBC w Diff:  Lab Results  Component Value Date   WBC 11.0 (H) 08/27/2018   HGB 10.4 (L) 08/27/2018   HCT 32.7 (L) 08/27/2018   PLT 233 08/27/2018   LYMPHOPCT 17 08/22/2018   MONOPCT 2 08/22/2018   EOSPCT 0 08/22/2018   BASOPCT 0 08/22/2018   CMP:  Lab Results  Component Value Date   NA 140 08/27/2018   K 3.5  08/27/2018   CL 105 08/27/2018   CO2 30 08/27/2018   BUN 21 08/27/2018   CREATININE 0.61 08/27/2018   CREATININE 0.87 05/09/2018   PROT 7.9 08/22/2018   ALBUMIN 4.2 08/22/2018   BILITOT 1.0 08/22/2018   ALKPHOS 74 08/22/2018   AST 32 08/22/2018   ALT 33 08/22/2018  .  Micro Results Recent Results (from the past 240 hour(s))  MRSA PCR Screening     Status: Abnormal   Collection Time: 08/22/18  1:16 PM  Result Value Ref Range Status   MRSA by PCR POSITIVE (A) NEGATIVE Final    Comment:        The GeneXpert MRSA Assay (FDA approved for NASAL specimens only), is one component of a comprehensive MRSA colonization surveillance program. It is not intended to diagnose MRSA infection nor to guide or monitor treatment for MRSA infections. CRITICAL RESULT CALLED TO, READ BACK BY AND VERIFIED WITH: CALLED TO JUAN RODRIGUEZ @1455  08/22/2018 [facility] Performed at [facility], [address] [place], [place] [address]   Culture, blood (routine x 2) Call MD if unable to obtain prior to antibiotics being given     Status: None   Collection Time: 08/22/18  1:41 PM  Result Value Ref Range Status   Specimen Description BLOOD BLOOD LEFT HAND  Final   Special Requests   Final  BOTTLES DRAWN AEROBIC AND ANAEROBIC Blood Culture adequate volume   Culture   Final    NO GROWTH 5 DAYS Performed at [facility], [address] [place], [place] [address]    Report Status 08/27/2018 FINAL  Final  Culture, blood (routine x 2) Call MD if unable to obtain prior to antibiotics being given     Status: None   Collection Time: 08/22/18  1:41 PM  Result Value Ref Range Status   Specimen Description BLOOD BLOOD LEFT WRIST  Final   Special Requests   Final    BOTTLES DRAWN AEROBIC AND ANAEROBIC Blood Culture adequate volume   Culture   Final    NO GROWTH 5 DAYS Performed at [facility], [address], [place], [place] [address]    Report Status 08/27/2018 FINAL  Final   Culture, respiratory     Status: None   Collection Time: 08/22/18  4:35 PM  Result Value Ref Range Status   Specimen Description   Final    TRACHEAL ASPIRATE Performed at [facility], [address], [place], [place] [address]    Special Requests   Final    NONE Performed at [facility], [address] [place], [place] [address]    Gram Stain   Final    FEW WBC PRESENT, PREDOMINANTLY PMN MODERATE YEAST Performed at [facility], [address] [place], [place] [address]    Culture FEW ENTEROBACTER AEROGENES FEW CANDIDA GLABRATA   Final   Report Status 08/28/2018 FINAL  Final   Organism ID, Bacteria ENTEROBACTER AEROGENES  Final      Susceptibility   Enterobacter aerogenes - MIC*    CEFAZOLIN >=64 RESISTANT Resistant     CEFEPIME <=1 SENSITIVE Sensitive     CEFTAZIDIME <=1 SENSITIVE Sensitive     CEFTRIAXONE <=1 SENSITIVE Sensitive     CIPROFLOXACIN <=0.25 SENSITIVE Sensitive     GENTAMICIN <=1 SENSITIVE Sensitive     IMIPENEM 1 SENSITIVE Sensitive     TRIMETH/SULFA <=20 SENSITIVE Sensitive     PIP/TAZO <=4 SENSITIVE Sensitive     * FEW ENTEROBACTER AEROGENES        Code Status Orders  (From admission, onward)         Start     Ordered   08/24/18 1210  Do not attempt resuscitation (DNR)  Continuous    Question Answer Comment  In the event of cardiac or respiratory ARREST Do not call a "code blue"   In the event of cardiac or respiratory ARREST Do not perform Intubation, CPR, defibrillation or ACLS   In the event of cardiac or respiratory ARREST Use medication by any route, position, wound care, and other measures to relive pain and suffering. May use oxygen, suction and manual treatment of airway obstruction as needed for comfort.      08/24/18 1209        Code Status History    Date Active Date Inactive Code Status Order ID Comments User Context   08/22/2018 1202 08/24/2018 1209 Full Code [id number]  Bertrum Sol, MD ED    08/22/2018 1202 08/22/2018 1202 Full Code [id number]  Salary, Evelena Asa, MD ED    Advance Directive Documentation     Most Recent Value  Type of Advance Directive  Healthcare Power of Attorney  Pre-existing out of facility DNR order (yellow form or pink MOST form)  -  "MOST" Form in Place?  -  Contact information for after-discharge care    Destination    [facility] Preferred SNF .   Service:  Skilled Nursing Contact information: [address] [place] [address] [phone]              Discharge Medications   Allergies as of 08/30/2018      Reactions   Lisinopril Hives, Swelling      Medication List    TAKE these medications   Acetaminophen 650 MG/20.3ML Susp Place 650 mg into feeding tube every 8 (eight) hours as needed for fever.   atorvastatin 20 MG tablet Commonly known as:  LIPITOR Take 1 tablet (20 mg total) by mouth daily. What changed:  how to take this   budesonide 0.25 MG/2ML nebulizer solution Commonly known as:  PULMICORT Take 2 mLs (0.25 mg total) by nebulization 2 (two) times daily.   docusate 50 MG/5ML liquid Commonly known as:  COLACE Place 100 mg into feeding tube 2 (two) times daily.   feeding supplement (OSMOLITE 1.5 CAL) Liqd Place 237 mLs into feeding tube 5 (five) times daily.   feeding supplement (PRO-STAT SUGAR FREE 64) Liqd Place 30 mLs into feeding tube 2 (two) times daily.   free water Soln Place 200 mLs into feeding tube 5 (five) times daily.   insulin regular 100 units/mL injection Commonly known as:  NOVOLIN R,HUMULIN R Inject 2-12 Units into the skin 4 (four) times daily -  before meals and at bedtime. slinding scale: 150-200=2; 201-250=4; 251-300=6; 301-350=8; 351-400=10; 401+=12 notify MD if BS is greater than 400   ipratropium-albuterol 0.5-2.5 (3) MG/3ML Soln Commonly known as:  DUONEB Take 3 mLs by nebulization 3 (three) times daily.   nystatin 100000 UNIT/ML  suspension Commonly known as:  MYCOSTATIN Take 5 mLs by mouth 4 (four) times daily. For thrush for 14 days, use pink sponge to apply to tongue   pantoprazole sodium 40 mg/20 mL Pack Commonly known as:  PROTONIX 40 mg by Gastric Tube route 2 (two) times daily.   pregabalin 50 MG capsule Commonly known as:  LYRICA Place 50 mg into feeding tube 2 (two) times daily.   scopolamine 1 MG/3DAYS Commonly known as:  TRANSDERM-SCOP Place 1 patch (1.5 mg total) onto the skin every 3 (three) days.   sennosides 8.8 MG/5ML syrup Commonly known as:  SENOKOT Place 2.5 mLs into feeding tube 2 (two) times daily.   terazosin 1 MG capsule Commonly known as:  HYTRIN Place 1 mg into feeding tube at bedtime.   trolamine salicylate 10 % cream Commonly known as:  ASPERCREME Apply 1 application topically daily as needed (feet pain).          Total Time in preparing paper work, data evaluation and todays exam - 35 minutes  Auburn Bilberry M.D on 08/30/2018 at 4:26 PM [facility]   Office  [phone]

## 2018-08-30 NOTE — Progress Notes (Signed)
Admitted yesterday for [facility] [place] [facility]   PATIENT NAME: Shane Sloan    MR#:  [id number]  DATE OF BIRTH:  [birth year]  SUBJECTIVE: Patient is extubated,.  Seen by palliative care.  Aphasic at baseline but appears comfortable.  CHIEF COMPLAINT: No authorization from insurance patient still waiting for bed   REVIEW OF SYSTEMS:   Review of Systems  Unable to perform ROS: Medical condition     DRUG ALLERGIES:   Allergies  Allergen Reactions  . Lisinopril Hives and Swelling    VITALS:  Blood pressure (!) 143/76, pulse 87, temperature 98.1 F (36.7 C), temperature source Oral, resp. rate 20, weight 56.7 kg, SpO2 91 %.  PHYSICAL EXAMINATION:  GENERAL:  68 y.o.-year-old patient lying in the bed, stable EYES: Pupils equal, round, reactive to light a. No scleral icterus. Extraocular muscles intact.  HEENT: Head atraumatic, normocephalic.  Orally intubated.  NECK:  Supple, no jugular venous distention. No thyroid enlargement, no tenderness.  LUNGS: Normal breath sounds bilaterally, no wheezing, rales,rhonchi or crepitation. No use of accessory muscles of respiration.  CARDIOVASCULAR: S1, S2 normal. No murmurs, rubs, or gallops.  ABDOMEN: Soft, nontender, nondistended. Bowel sounds present. No organomegaly or mass.  PEG tube site is not infected. EXTREMITIES: No pedal edema, cyanosis, or clubbing.  NEUROLOGIChas baseline aphasia pSYCHIATRIC: The patient is sleepy this morning not oriented SKIN: No obvious rash, lesion, or ulcer.    LABORATORY PANEL:   CBC Recent Labs  Lab 08/27/18 0400  WBC 11.0*  HGB 10.4*  HCT 32.7*  PLT 233   ------------------------------------------------------------------------------------------------------------------  Chemistries  Recent Labs  Lab 08/27/18 0400  NA 140  K 3.5  CL 105  CO2 30  GLUCOSE 118*  BUN 21  CREATININE 0.61  CALCIUM 8.3*    ------------------------------------------------------------------------------------------------------------------  Cardiac Enzymes No results for input(s): TROPONINI in the last 168 hours. ------------------------------------------------------------------------------------------------------------------  RADIOLOGY:  No results found.  EKG:   Orders placed or performed during the hospital encounter of 08/22/18  . ED EKG  . ED EKG  . EKG 12-Lead  . EKG 12-Lead    ASSESSMENT AND PLAN:  68 year old male with history of previous stroke aphasia brought in from nursing home because of hematemesis, patient developed respiratory distress prior to EGD so intubated, transferred to ICU after EGD.  *acute respiratory failure likely secondary to aspiration pneumonia Successfully extubated, suctioning PRN, aspiration precautions, head of bed at 30 degrees at all time Secretions improved Continue scopolamine patch Prognosis very poor   *Acute left aspiration pneumonia Change to Augmentin  *Acute GI bleeding Hemoglobin stable Suspected due to malposition PEG tube which was adjusted by gastroenterology, general surgery did see patient while in house s/p EGD noted for nonbleeding duodenal ulcer Hemoglobin stable  *Acute on chronic dysphagia with inadequate oral intake Secondary to CVA Continue PEG tube feeding   *Chronic diabetes mellitus type 2 Stable on current regiment  *Chronic hyperlipidemia, unspecified Stable  *Chronic hypertension Controlled on current regiment  *History of CVA 2014 with aphasia, dysphasia s/p G-tube  Stable  Supportive care PRN   *Acute gastric outlet obstruction w/ duodenal ulcer G-tube repositioning by gastroenterology   *hypokalemia Repleted  Disposition back to skilled nursing facility awaiting insurance authorization   Patient seen by palliative care,  recommended DNR status, CODE STATUS changed to DNR. All the records are reviewed and  case discussed with Care Management/Social Workerr. Management plans discussed with the patient, family and they are in agreement.  CODE STATUS: DNR  TOTAL TIME TAKING CARE OF THIS PATIENT: 25 minutes.   POSSIBLE D/C IN 1-2 DAYS, DEPENDING ON CLINICAL CONDITION.   Auburn Bilberry M.D on 08/30/2018 at 12:27 PM  Between 7am to 6pm - Pager - [phone]  After 6pm go to [email or web address] - password [email or web address] [facility]  [facility]  Office  [phone]  CC: Primary care physician; Charlott Rakes, MD   Note: This dictation was prepared with Dragon dictation along with smaller phrase technology. Any transcriptional errors that result from this process are unintentional.

## 2018-09-09 DIAGNOSIS — I69320 Aphasia following cerebral infarction: Secondary | ICD-10-CM | POA: Diagnosis not present

## 2018-09-09 DIAGNOSIS — Z8719 Personal history of other diseases of the digestive system: Secondary | ICD-10-CM | POA: Diagnosis not present

## 2018-09-09 DIAGNOSIS — Z931 Gastrostomy status: Secondary | ICD-10-CM | POA: Diagnosis not present

## 2018-09-09 DIAGNOSIS — K279 Peptic ulcer, site unspecified, unspecified as acute or chronic, without hemorrhage or perforation: Secondary | ICD-10-CM | POA: Diagnosis not present

## 2018-09-12 DIAGNOSIS — J189 Pneumonia, unspecified organism: Secondary | ICD-10-CM | POA: Diagnosis not present

## 2018-09-12 DIAGNOSIS — N179 Acute kidney failure, unspecified: Secondary | ICD-10-CM | POA: Diagnosis not present

## 2018-09-12 DIAGNOSIS — I10 Essential (primary) hypertension: Secondary | ICD-10-CM | POA: Diagnosis not present

## 2018-09-12 DIAGNOSIS — D649 Anemia, unspecified: Secondary | ICD-10-CM | POA: Diagnosis not present

## 2018-09-13 DIAGNOSIS — I69320 Aphasia following cerebral infarction: Secondary | ICD-10-CM | POA: Diagnosis not present

## 2018-09-13 DIAGNOSIS — Z8719 Personal history of other diseases of the digestive system: Secondary | ICD-10-CM | POA: Diagnosis not present

## 2018-09-13 DIAGNOSIS — K279 Peptic ulcer, site unspecified, unspecified as acute or chronic, without hemorrhage or perforation: Secondary | ICD-10-CM | POA: Diagnosis not present

## 2018-09-13 DIAGNOSIS — M6281 Muscle weakness (generalized): Secondary | ICD-10-CM | POA: Diagnosis not present

## 2018-09-17 DIAGNOSIS — R110 Nausea: Secondary | ICD-10-CM | POA: Diagnosis not present

## 2018-09-27 ENCOUNTER — Other Ambulatory Visit: Payer: Self-pay

## 2018-09-27 ENCOUNTER — Ambulatory Visit
Admit: 2018-09-27 | Discharge: 2018-09-27 | Disposition: A | Discharge status: Home / Self Care | Payer: Medicare HMO | Attending: Emergency Medicine | Admitting: Emergency Medicine

## 2018-09-27 ENCOUNTER — Emergency Department
Admission: EM | Admit: 2018-09-27 | Discharge: 2018-09-27 | Disposition: A | Discharge status: Skilled Nursing Facility | Payer: Medicare HMO | Attending: Emergency Medicine | Admitting: Emergency Medicine

## 2018-09-27 ENCOUNTER — Encounter: Payer: Self-pay | Admitting: Emergency Medicine

## 2018-09-27 DIAGNOSIS — E785 Hyperlipidemia, unspecified: Secondary | ICD-10-CM | POA: Diagnosis not present

## 2018-09-27 DIAGNOSIS — E119 Type 2 diabetes mellitus without complications: Secondary | ICD-10-CM | POA: Diagnosis not present

## 2018-09-27 DIAGNOSIS — J45909 Unspecified asthma, uncomplicated: Secondary | ICD-10-CM | POA: Diagnosis not present

## 2018-09-27 DIAGNOSIS — Z431 Encounter for attention to gastrostomy: Secondary | ICD-10-CM | POA: Diagnosis not present

## 2018-09-27 DIAGNOSIS — R404 Transient alteration of awareness: Secondary | ICD-10-CM | POA: Diagnosis not present

## 2018-09-27 DIAGNOSIS — R0902 Hypoxemia: Secondary | ICD-10-CM | POA: Diagnosis not present

## 2018-09-27 DIAGNOSIS — K9423 Gastrostomy malfunction: Secondary | ICD-10-CM | POA: Diagnosis not present

## 2018-09-27 DIAGNOSIS — Z743 Need for continuous supervision: Secondary | ICD-10-CM | POA: Diagnosis not present

## 2018-09-27 DIAGNOSIS — R279 Unspecified lack of coordination: Secondary | ICD-10-CM | POA: Diagnosis not present

## 2018-09-27 HISTORY — PX: IR GASTROSTOMY TUBE MOD SED: IMG625

## 2018-09-27 LAB — COMPREHENSIVE METABOLIC PANEL
ALT: 76 U/L — ABNORMAL HIGH (ref 0–44)
ANION GAP: 8 (ref 5–15)
AST: 50 U/L — ABNORMAL HIGH (ref 15–41)
Albumin: 3.4 g/dL — ABNORMAL LOW (ref 3.5–5.0)
Alkaline Phosphatase: 62 U/L (ref 38–126)
BUN: 11 mg/dL (ref 8–23)
CO2: 28 mmol/L (ref 22–32)
Calcium: 8.7 mg/dL — ABNORMAL LOW (ref 8.9–10.3)
Chloride: 100 mmol/L (ref 98–111)
Creatinine, Ser: 0.67 mg/dL (ref 0.61–1.24)
GFR calc Af Amer: >60 mL/min (ref >60)
GFR calc non Af Amer: >60 mL/min (ref >60)
Glucose, Bld: 101 mg/dL — ABNORMAL HIGH (ref 70–99)
Potassium: 4.6 mmol/L (ref 3.5–5.1)
Sodium: 136 mmol/L (ref 135–145)
Total Bilirubin: 0.7 mg/dL (ref 0.3–1.2)
Total Protein: 7.3 g/dL (ref 6.5–8.1)

## 2018-09-27 LAB — CBC WITH DIFFERENTIAL/PLATELET
Abs Immature Granulocytes: 0.01 10*3/uL (ref 0.00–0.07)
BASOS ABS: 0 10*3/uL (ref 0.0–0.1)
BASOS PCT: 0 %
EOS PCT: 2 %
Eosinophils Absolute: 0.1 10*3/uL (ref 0.0–0.5)
HCT: 37.7 % — ABNORMAL LOW (ref 39.0–52.0)
Hemoglobin: 12.1 g/dL — ABNORMAL LOW (ref 13.0–17.0)
Immature Granulocytes: 0 %
Lymphocytes Relative: 30 %
Lymphs Abs: 2 10*3/uL (ref 0.7–4.0)
MCH: 28 pg (ref 26.0–34.0)
MCHC: 32.1 g/dL (ref 30.0–36.0)
MCV: 87.3 fL (ref 80.0–100.0)
Monocytes Absolute: 0.3 10*3/uL (ref 0.1–1.0)
Monocytes Relative: 5 %
Neutro Abs: 4.1 10*3/uL (ref 1.7–7.7)
Neutrophils Relative %: 63 %
PLATELETS: 255 10*3/uL (ref 150–400)
RBC: 4.32 MIL/uL (ref 4.22–5.81)
RDW: 14.2 % (ref 11.5–15.5)
WBC: 6.5 10*3/uL (ref 4.0–10.5)
nRBC: 0 % (ref 0.0–0.2)

## 2018-09-27 MED ORDER — IOHEXOL 300 MG/ML  SOLN: 30.0000 mL | Freq: Once | INTRAMUSCULAR | Status: DC | PRN

## 2018-09-27 MED ORDER — OXYCODONE-ACETAMINOPHEN 5-325 MG PO TABS
2.0000 | ORAL_TABLET | Freq: Once | ORAL | Status: AC
  Administered 2018-09-27: 2
  Filled 2018-09-27: qty 2

## 2018-09-27 MED ORDER — LIDOCAINE HCL (PF) 1 % IJ SOLN
INTRAMUSCULAR | Status: AC
  Filled 2018-09-27: qty 30

## 2018-09-27 NOTE — ED Notes (Signed)
Pt is verbalizing "pain" and pointing at PEG tube location.

## 2018-09-27 NOTE — ED Notes (Addendum)
This RN received a phone call from Shane Sloan from Internal radiology, pt is scheduled for procedure at 1315hrs.

## 2018-09-27 NOTE — ED Notes (Addendum)
Attempted to call report to [facility] with no answer. Will try again, prior to patient discharge.

## 2018-09-27 NOTE — ED Notes (Signed)
[facility]  CALLED  FOR  TRANSPORT   TO    HOUSE 

## 2018-09-27 NOTE — ED Triage Notes (Addendum)
Pt from [facility] via [facility]. Per EMS, pt pulled his peg tube out. Pt is bed bound. pt at baseline, EMS VS 142/86; 93%SpO2 RA; 76 HR. EPD at bedside.

## 2018-09-27 NOTE — ED Provider Notes (Signed)
PEG tube was replaced by interventional radiology.  He is cleared for outpatient follow-up.   Emily Filbert, MD 09/27/18 1352

## 2018-09-27 NOTE — ED Notes (Signed)
IR able to place 18 [place] Peg Tube. Patient tolerated procedure well. Report given by Dorann Lodge, RN

## 2018-09-27 NOTE — ED Provider Notes (Signed)
[facility] Emergency Department Provider Note       Time seen: ----------------------------------------- 10:01 AM on 09/27/2018 -----------------------------------------   I have reviewed the triage vital signs and the nursing notes.  HISTORY   Chief Complaint No chief complaint on file.    HPI Shane Sloan is a 68 y.o. male with a history of allergies, diabetes, dysphagia, GERD, hyperlipidemia, hypertension, encephalopathy, prostate disease, CVA who presents to the ED for PEG tube removal.  Patient is brought by [facility] by ambulance for a PEG tube removal.  Patient is bedbound, it is unclear how long the tube has been out.  Past Medical History:  Diagnosis Date  . Allergy   . Asthma   . Diabetes mellitus without complication (HCC)   . Dysphagia   . GERD (gastroesophageal reflux disease)   . H/O blood clots   . Hyperlipidemia   . Hypertension   . Iron deficiency anemia   . Metabolic encephalopathy   . Prostate disease   . Stroke (HCC) 05/31/2013  . Urinary incontinence     Patient Active Problem List   Diagnosis Date Noted  . Pneumonia due to infectious organism   . Acute respiratory failure with hypoxia (HCC)   . Advanced care planning/counseling discussion   . Goals of care, counseling/discussion   . Palliative care by specialist   . Hematemesis 08/22/2018  . GIB (gastrointestinal bleeding) 08/22/2018  . Pressure injury of skin 08/22/2018  . Metabolic encephalopathy   . Postpyloric ulcer   . Primary osteoarthritis involving multiple joints 12/03/2017  . Cognitive deficit due to old cerebrovascular accident (CVA) 11/30/2016  . Polyneuropathy due to medical condition (HCC) 11/30/2016  . Controlled type 2 diabetes mellitus with diabetic autonomic neuropathy, without long-term current use of insulin (HCC) 11/04/2016  . Hyperlipidemia 11/04/2016  . Hypertension 11/04/2016  . BPH associated with nocturia 11/04/2016  . H/O blood  clots   . Asthma   . History of stroke in adulthood 05/31/2013    Past Surgical History:  Procedure Laterality Date  . ESOPHAGOGASTRODUODENOSCOPY N/A 08/22/2018   Procedure: ESOPHAGOGASTRODUODENOSCOPY (EGD);  Surgeon: Pasty Spillers, MD;  Location: [facility] ENDOSCOPY;  Service: Endoscopy;  Laterality: N/A;  . GASTROSTOMY TUBE PLACEMENT      Allergies Lisinopril  Social History Social History   Tobacco Use  . Smoking status: Never Smoker  . Smokeless tobacco: Never Used  Substance Use Topics  . Alcohol use: No  . Drug use: No   Review of Systems Constitutional: Negative for fever. Cardiovascular: Negative for chest pain. Respiratory: Negative for shortness of breath. Gastrointestinal: Negative for abdominal pain Musculoskeletal: Negative for back pain. Skin: Negative for rash. Neurological: Negative for headaches, focal weakness or numbness.  All systems negative/normal/unremarkable except as stated in the HPI  ____________________________________________   PHYSICAL EXAM:  VITAL SIGNS: ED Triage Vitals  Enc Vitals Group     BP      Pulse      Resp      Temp      Temp src      SpO2      Weight      Height      Head Circumference      Peak Flow      Pain Score      Pain Loc      Pain Edu?      Excl. in GC?    Constitutional: Alert and oriented. Well appearing and in no distress. Cardiovascular: Normal rate, regular rhythm.  No murmurs, rubs, or gallops. Respiratory: Normal respiratory effort without tachypnea nor retractions. Breath sounds are clear and equal bilaterally. No wheezes/rales/rhonchi. Gastrointestinal: Soft and nontender. Normal bowel sounds.  Left upper quadrant PEG tube site appears to be closed Musculoskeletal: Nontender with normal range of motion in extremities. No lower extremity tenderness nor edema. Skin:  Skin is warm, dry and intact. No rash noted. ____________________________________________  ED COURSE:  As part of my medical  decision making, I reviewed the following data within the electronic MEDICAL RECORD NUMBER History obtained from family if available, nursing notes, old chart and ekg, as well as notes from prior ED visits. Patient presented for accidental PEG tube removal, I will attempt to replace and obtain imaging if possible.  22 French PEG tube was unable to be passed through the skin.  This appears to have closed entirely at this point.   Procedures ____________________________________________   LABS (pertinent positives/negatives)  Labs Reviewed  CBC WITH DIFFERENTIAL/PLATELET - Abnormal; Notable for the following components:      Result Value   Hemoglobin 12.1 (*)    HCT 37.7 (*)    All other components within normal limits  COMPREHENSIVE METABOLIC PANEL - Abnormal; Notable for the following components:   Glucose, Bld 101 (*)    Calcium 8.7 (*)    Albumin 3.4 (*)    AST 50 (*)    ALT 76 (*)    All other components within normal limits   ____________________________________________   DIFFERENTIAL DIAGNOSIS   PEG tube removal  FINAL ASSESSMENT AND PLAN  PEG tube removal   Plan: The patient had presented for PEG tube removal. Patient's labs are grossly unremarkable.  Again I was unable to pass the tube back into his stomach.  Have discussed with interventional radiology who will attempt to place here in the hospital.   Ulice Dash, MD    Note: This note was generated in part or whole with voice recognition software. Voice recognition is usually quite accurate but there are transcription errors that can and very often do occur. I apologize for any typographical errors that were not detected and corrected.     Emily Filbert, MD 09/27/18 [id number]

## 2018-09-27 NOTE — ED Notes (Signed)
Pt transported to IR procedure.

## 2018-09-27 NOTE — Procedures (Signed)
18 Fr balloon G tube replacement Tip is in stomach, ready for use EBL 0 Comp 0

## 2018-09-29 DIAGNOSIS — R05 Cough: Secondary | ICD-10-CM | POA: Diagnosis not present

## 2018-09-30 DIAGNOSIS — D649 Anemia, unspecified: Secondary | ICD-10-CM | POA: Diagnosis not present

## 2018-09-30 DIAGNOSIS — E0840 Diabetes mellitus due to underlying condition with diabetic neuropathy, unspecified: Secondary | ICD-10-CM | POA: Diagnosis not present

## 2018-09-30 DIAGNOSIS — R339 Retention of urine, unspecified: Secondary | ICD-10-CM | POA: Diagnosis not present

## 2018-09-30 DIAGNOSIS — N390 Urinary tract infection, site not specified: Secondary | ICD-10-CM | POA: Diagnosis not present

## 2018-09-30 DIAGNOSIS — J690 Pneumonitis due to inhalation of food and vomit: Secondary | ICD-10-CM | POA: Diagnosis not present

## 2018-09-30 DIAGNOSIS — R319 Hematuria, unspecified: Secondary | ICD-10-CM | POA: Diagnosis not present

## 2018-10-01 DIAGNOSIS — J9601 Acute respiratory failure with hypoxia: Secondary | ICD-10-CM | POA: Diagnosis not present

## 2018-10-01 DIAGNOSIS — R0989 Other specified symptoms and signs involving the circulatory and respiratory systems: Secondary | ICD-10-CM | POA: Diagnosis not present

## 2018-10-03 DIAGNOSIS — R0989 Other specified symptoms and signs involving the circulatory and respiratory systems: Secondary | ICD-10-CM | POA: Diagnosis not present

## 2018-10-03 DIAGNOSIS — N179 Acute kidney failure, unspecified: Secondary | ICD-10-CM | POA: Diagnosis not present

## 2018-10-03 DIAGNOSIS — R41841 Cognitive communication deficit: Secondary | ICD-10-CM | POA: Diagnosis not present

## 2018-10-03 DIAGNOSIS — E0840 Diabetes mellitus due to underlying condition with diabetic neuropathy, unspecified: Secondary | ICD-10-CM | POA: Diagnosis not present

## 2018-10-03 DIAGNOSIS — R569 Unspecified convulsions: Secondary | ICD-10-CM | POA: Diagnosis not present

## 2018-10-03 DIAGNOSIS — R339 Retention of urine, unspecified: Secondary | ICD-10-CM | POA: Diagnosis not present

## 2018-10-03 DIAGNOSIS — I10 Essential (primary) hypertension: Secondary | ICD-10-CM | POA: Diagnosis not present

## 2018-10-03 DIAGNOSIS — J189 Pneumonia, unspecified organism: Secondary | ICD-10-CM | POA: Diagnosis not present

## 2018-10-03 DIAGNOSIS — J690 Pneumonitis due to inhalation of food and vomit: Secondary | ICD-10-CM | POA: Diagnosis not present

## 2018-10-03 DIAGNOSIS — Z1159 Encounter for screening for other viral diseases: Secondary | ICD-10-CM | POA: Diagnosis not present

## 2018-10-03 DIAGNOSIS — R509 Fever, unspecified: Secondary | ICD-10-CM | POA: Diagnosis not present

## 2018-10-03 DIAGNOSIS — J9601 Acute respiratory failure with hypoxia: Secondary | ICD-10-CM | POA: Diagnosis not present

## 2018-10-06 DIAGNOSIS — Z431 Encounter for attention to gastrostomy: Secondary | ICD-10-CM | POA: Diagnosis not present

## 2018-10-14 DIAGNOSIS — Z1331 Encounter for screening for depression: Secondary | ICD-10-CM | POA: Diagnosis not present

## 2018-10-14 DIAGNOSIS — E1369 Other specified diabetes mellitus with other specified complication: Secondary | ICD-10-CM | POA: Diagnosis not present

## 2018-10-14 DIAGNOSIS — Z931 Gastrostomy status: Secondary | ICD-10-CM | POA: Diagnosis not present

## 2018-10-14 DIAGNOSIS — E1140 Type 2 diabetes mellitus with diabetic neuropathy, unspecified: Secondary | ICD-10-CM | POA: Diagnosis not present

## 2018-10-14 DIAGNOSIS — Z139 Encounter for screening, unspecified: Secondary | ICD-10-CM | POA: Diagnosis not present

## 2018-10-14 DIAGNOSIS — Z0000 Encounter for general adult medical examination without abnormal findings: Secondary | ICD-10-CM | POA: Diagnosis not present

## 2018-10-14 DIAGNOSIS — E785 Hyperlipidemia, unspecified: Secondary | ICD-10-CM | POA: Diagnosis not present

## 2018-10-18 DIAGNOSIS — N390 Urinary tract infection, site not specified: Secondary | ICD-10-CM | POA: Diagnosis not present

## 2018-10-18 DIAGNOSIS — R319 Hematuria, unspecified: Secondary | ICD-10-CM | POA: Diagnosis not present

## 2018-10-18 DIAGNOSIS — Z79899 Other long term (current) drug therapy: Secondary | ICD-10-CM | POA: Diagnosis not present

## 2018-10-18 DIAGNOSIS — R0789 Other chest pain: Secondary | ICD-10-CM | POA: Diagnosis not present

## 2018-10-18 DIAGNOSIS — D649 Anemia, unspecified: Secondary | ICD-10-CM | POA: Diagnosis not present

## 2018-10-18 DIAGNOSIS — R093 Abnormal sputum: Secondary | ICD-10-CM | POA: Diagnosis not present

## 2018-10-18 DIAGNOSIS — I959 Hypotension, unspecified: Secondary | ICD-10-CM | POA: Diagnosis not present

## 2018-10-24 DIAGNOSIS — R4701 Aphasia: Secondary | ICD-10-CM | POA: Diagnosis not present

## 2018-10-24 DIAGNOSIS — I69391 Dysphagia following cerebral infarction: Secondary | ICD-10-CM | POA: Diagnosis not present

## 2018-11-04 DEATH — deceased

## 2019-03-29 IMAGING — DX DG SHOULDER 2+V*R*
3 series · 3 of 3 positions shown · non-contrast
Comparison: None.

CLINICAL DATA: Pain in right shoulder since August 2016, worsening in
the last month, decreased range f motion and very painful to move,
more painful to move arm up or out, no injury old or new, no
surgery, posterior neck pain when sleeping and over to the back of
rt shoulder and down his arm,

EXAM:
RIGHT SHOULDER - 2+ VIEW

[shoulder ap]
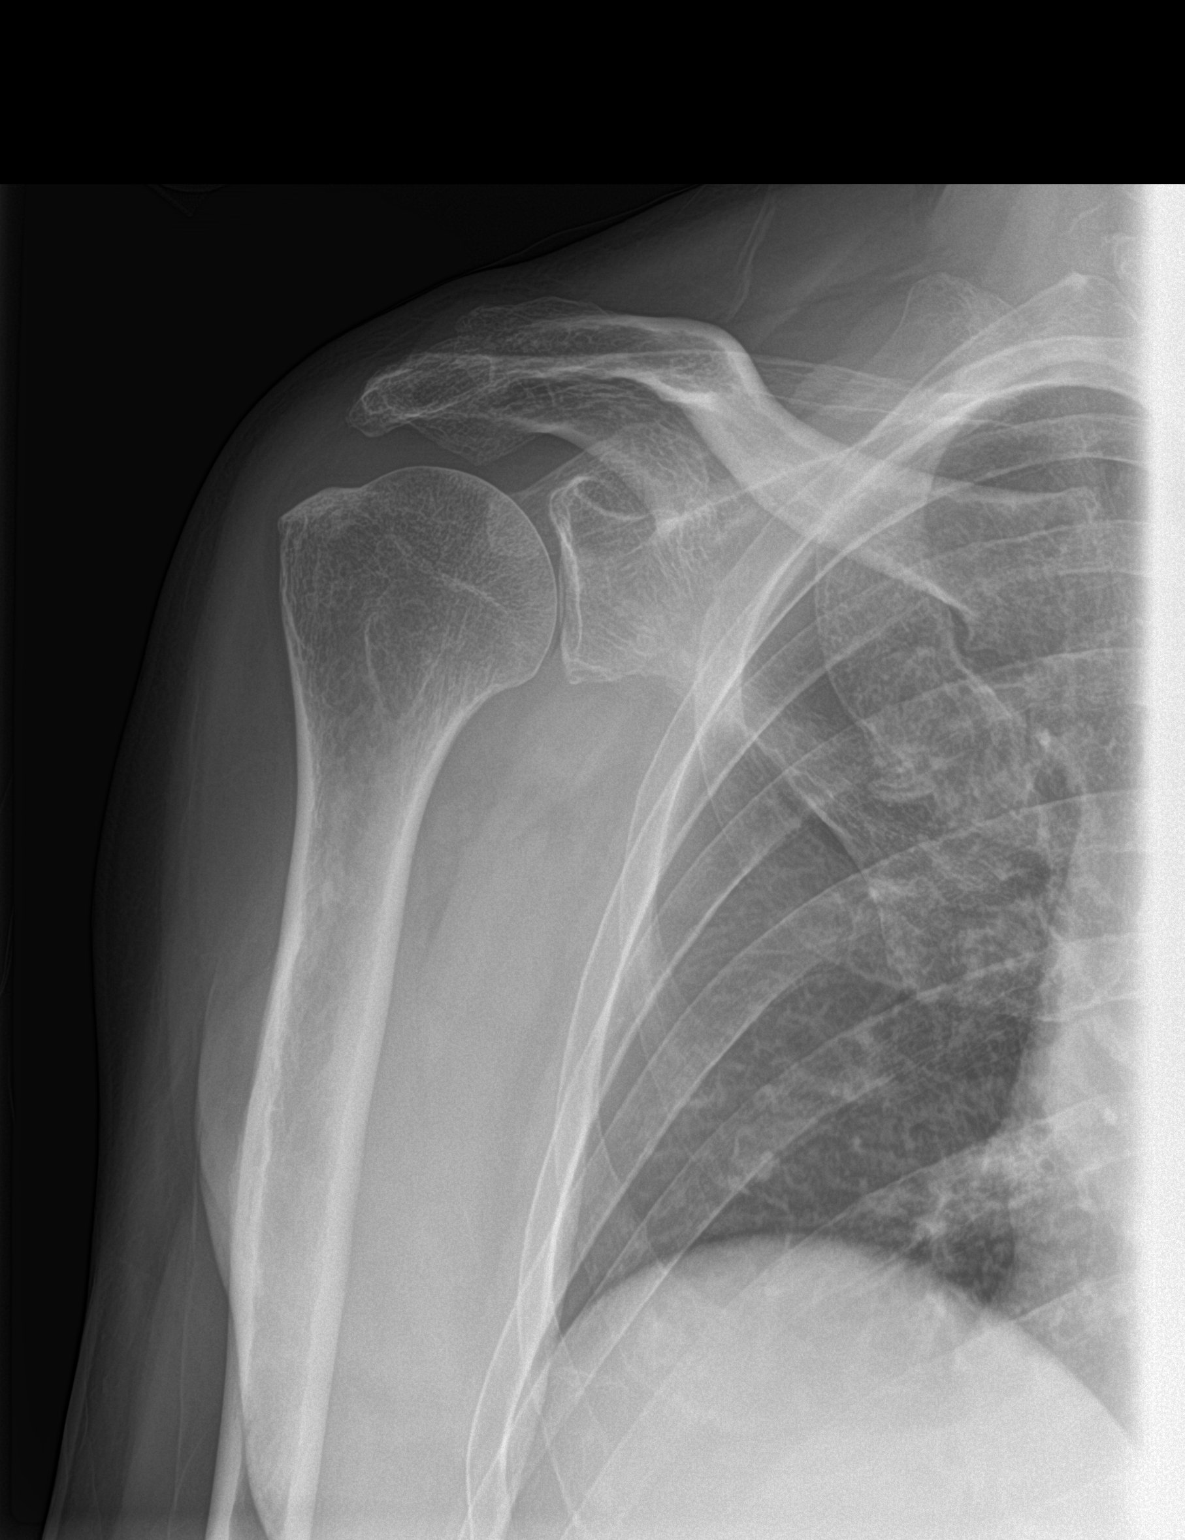

[shoulder y-view]
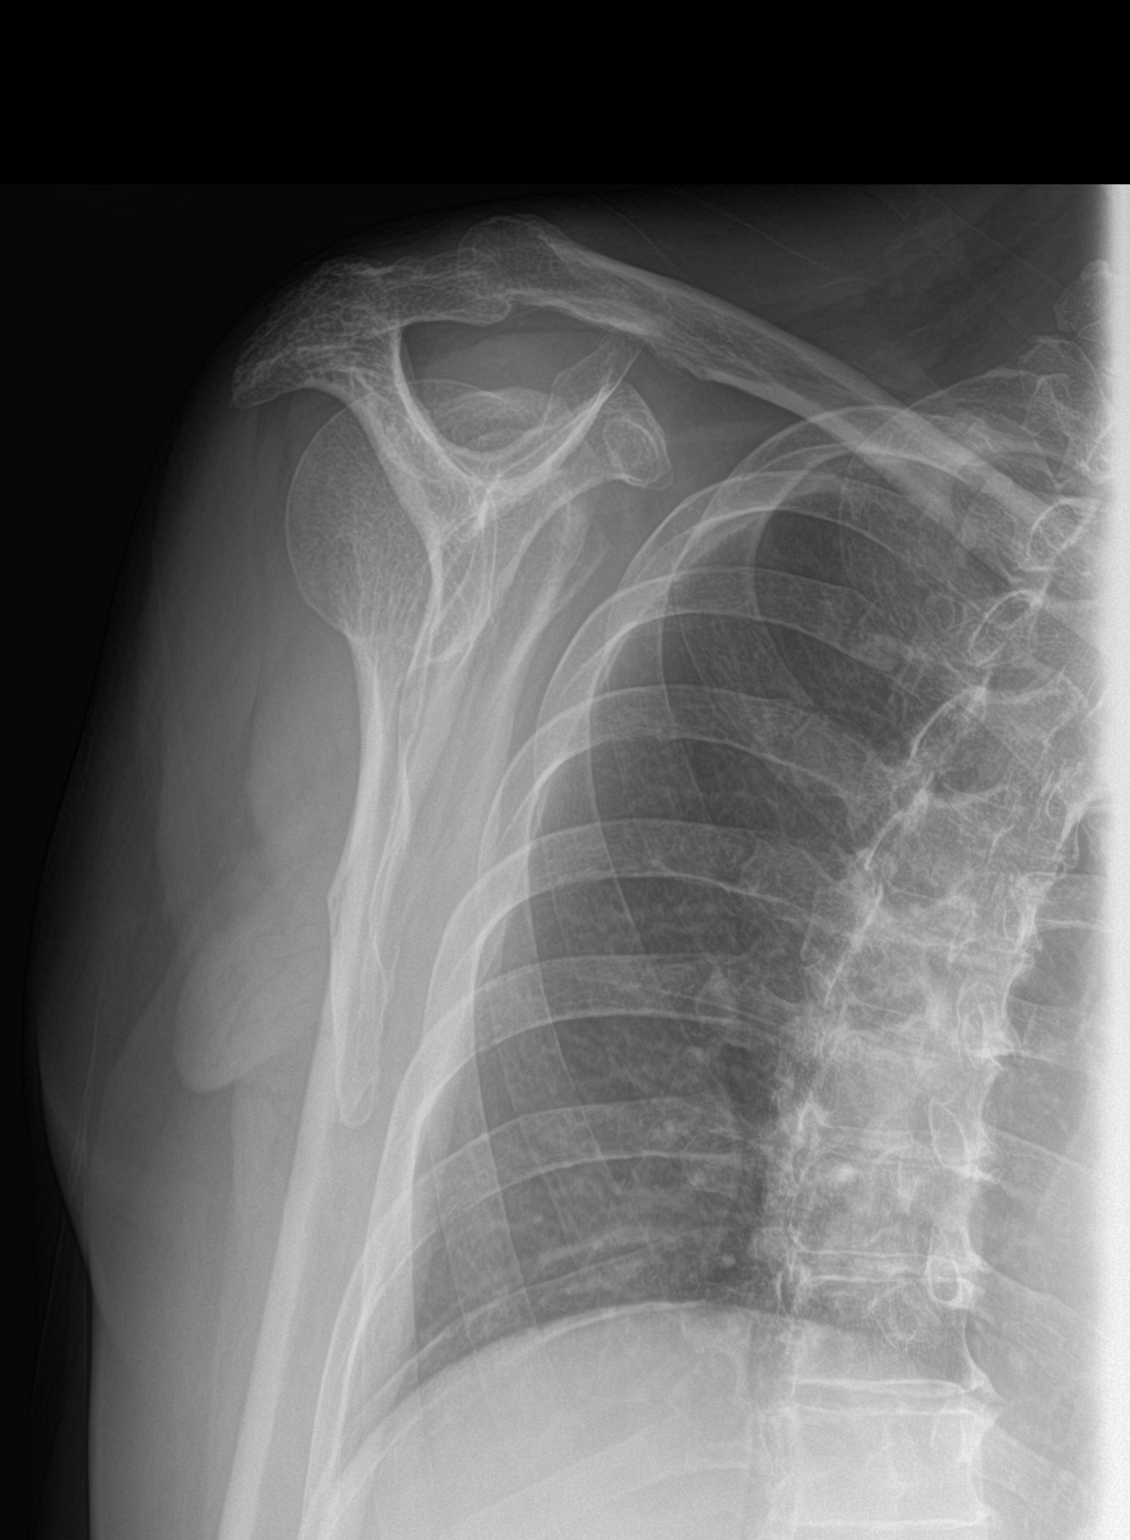

[shoulder axial]
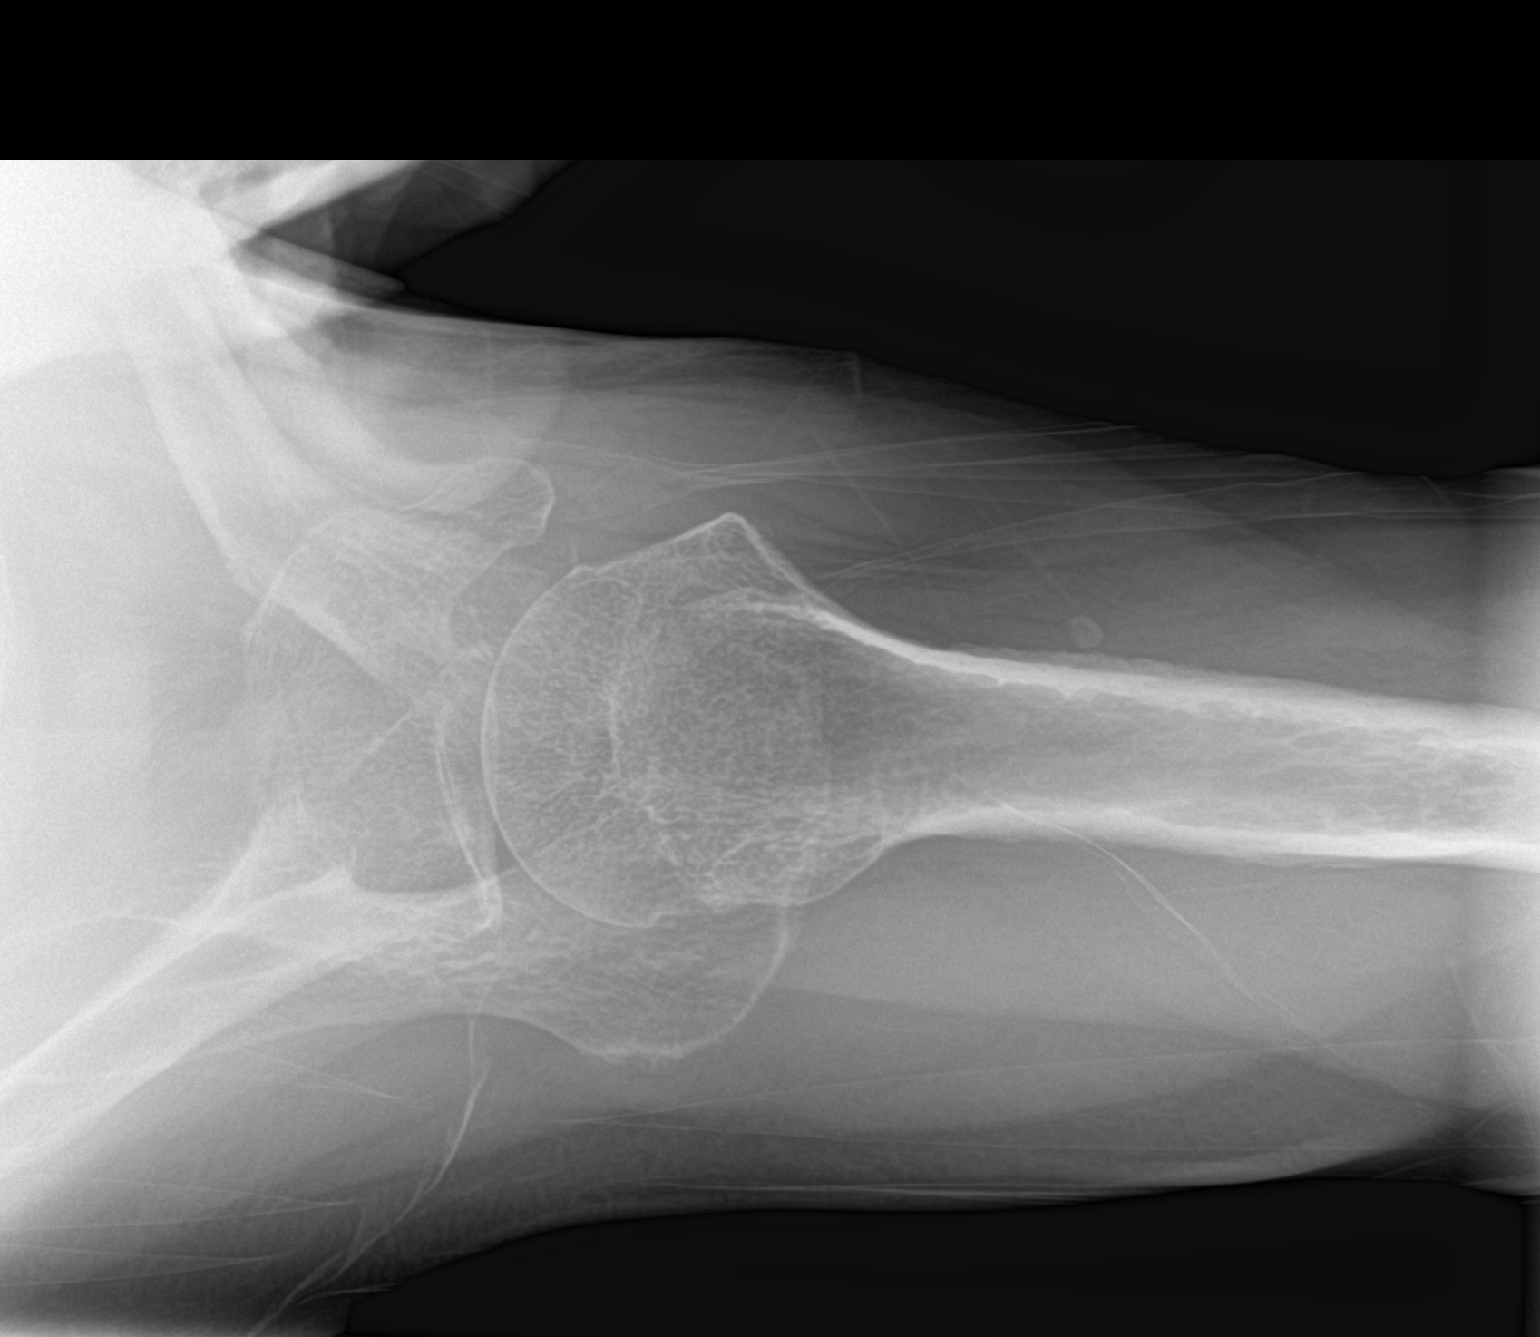

[3 of 3 positions shown; findings below may reference images not displayed]

FINDINGS: There is no evidence of fracture or dislocation. There is no
evidence of arthropathy or other focal bone abnormality. Soft
tissues are unremarkable.
IMPRESSION: Negative.

## 2020-02-14 IMAGING — RF DG PERC GASTROSTOMY TUBE INSERT W/FLUORO
1 series · 5 of 5 positions shown · non-contrast
Comparison: none

INDICATION: Complete dislodgement of percutaneous gastrostomy tube placed 6 days
ago at [HOSPITAL].

[Series 1: cp_standard · 0.19mm/px · 5 of 5 slices shown]
[im 1/5]
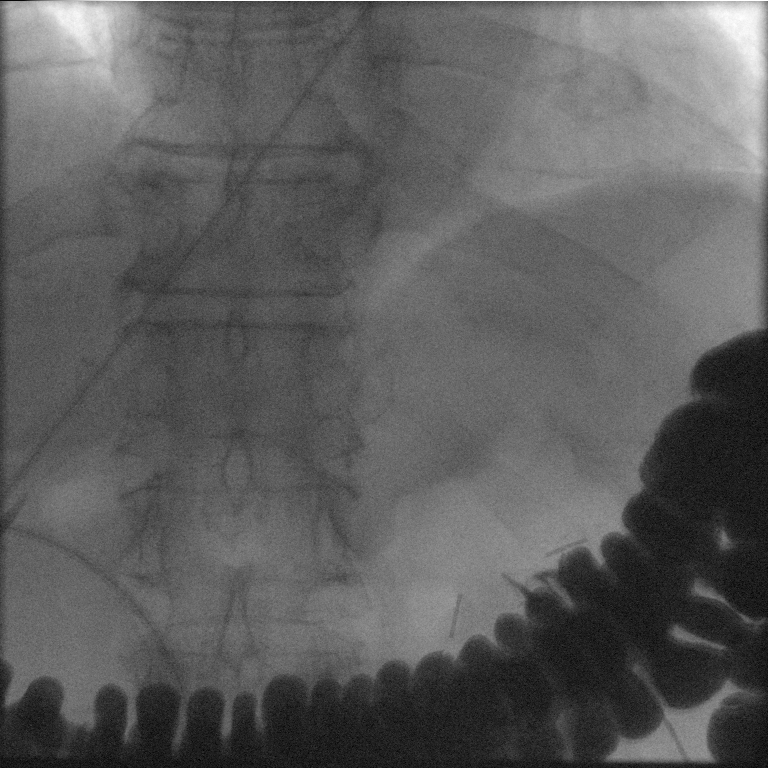
[im 2/5]
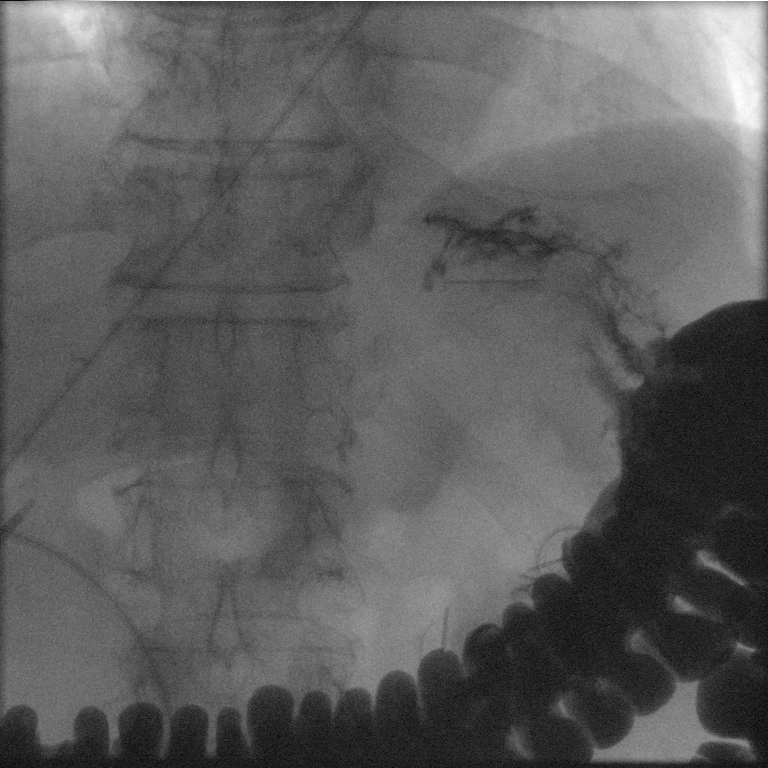
[im 3/5]
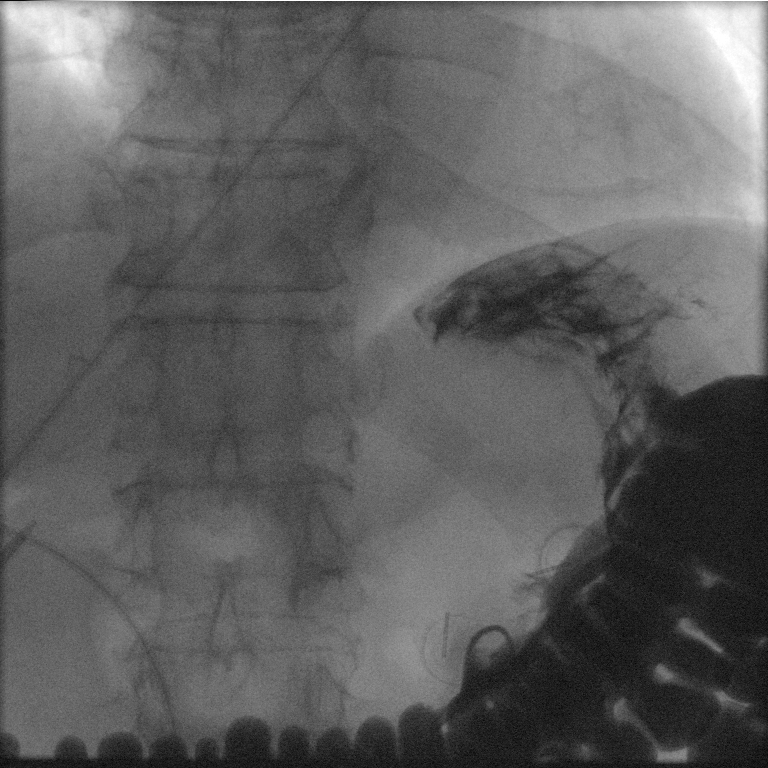
[im 4/5]
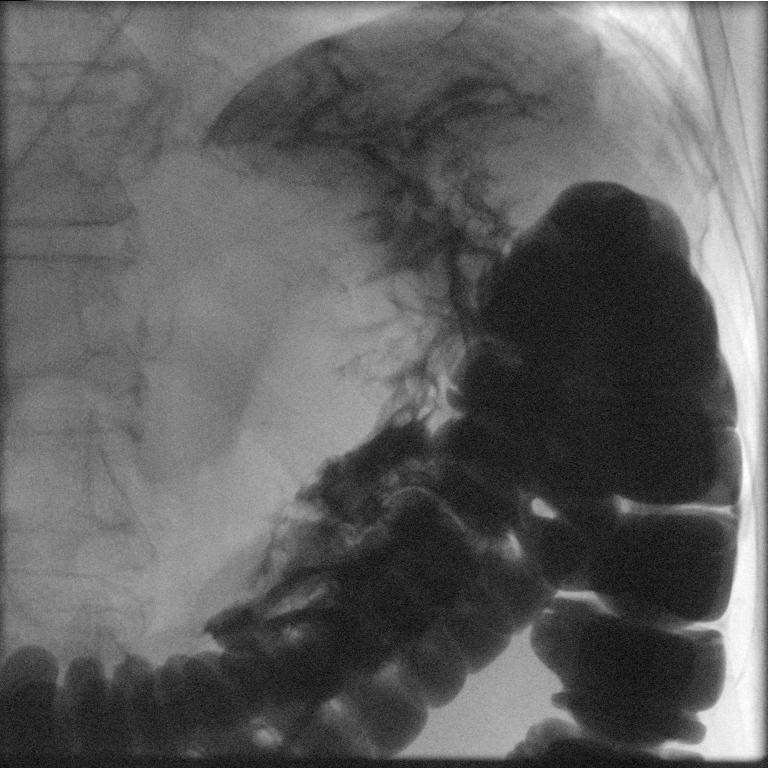
[im 5/5]
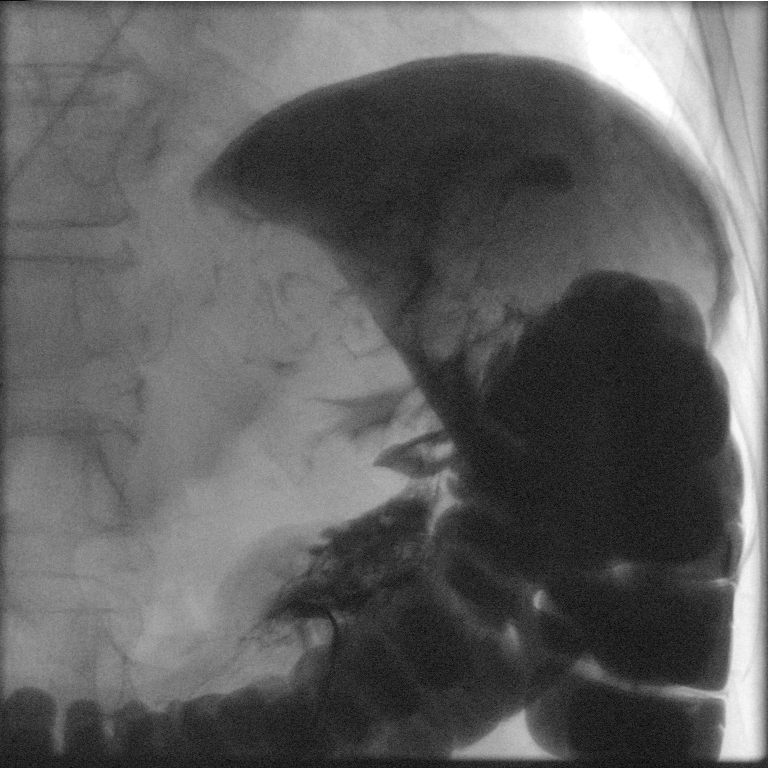

[5 of 5 positions shown; findings below may reference images not displayed]

EXAM:
ATTEMPTED PERCUTANEOUS GASTROSTOMY TUBE REPLACEMENT UNDER
FLUOROSCOPY

MEDICATIONS:
None

ANESTHESIA/SEDATION:
None

CONTRAST:  20 mL [id number]

FLUOROSCOPY TIME:  Fluoroscopy Time: 2 minutes.  44 mGy.

COMPLICATIONS:
None immediate.

PROCEDURE:
The gastrostomy tube exit site was prepped with chlorhexidine.
During the procedure, viscous lidocaine was utilized as well as
infiltration of 1% lidocaine around the gastrostomy tube exit site.

A 5 French dilator was advanced through the exit site under
fluoroscopy. Contrast injection was performed. A guidewire was
advanced under fluoroscopy.

Tract dilatation was performed over the guidewire. An 18 French
balloon retention gastrostomy tube was advanced through the tract.
The tube was injected with contrast under fluoroscopy. Ultimately,
the tube was removed.
FINDINGS: After probing the gastrostomy exit site with a 5 French dilator and
injection of contrast, there was ability to opacify some gastric
rugae. After dilating the percutaneous tract over a guidewire, a
gastrostomy tube was able to be advanced. Although some gastric
rugae were able to be visualized with tube injection, there clearly
was extravasation of contrast into the peritoneal cavity in the left
upper quadrant and subphrenic space and the tube was clearly not
entirely in the stomach. Gastrostomy tube replacement was therefore
abandoned at that point due to lack of success.
IMPRESSION: Inability to successfully advance a balloon retention gastrostomy
tube into the gastric lumen. Although initial opacification of
gastric rugae was successful via a dilator advanced into the
percutaneous tract, a balloon retention gastrostomy tube could not
be successfully advanced fully into the gastric lumen after tract
dilatation.

## 2020-05-07 IMAGING — CR DG CHEST 2V
2 series · 2 of 2 positions shown · non-contrast
Comparison: None.

CLINICAL DATA: Hypoxia.  Vomiting blood.

EXAM:
CHEST - 2 VIEW

[chest lat]
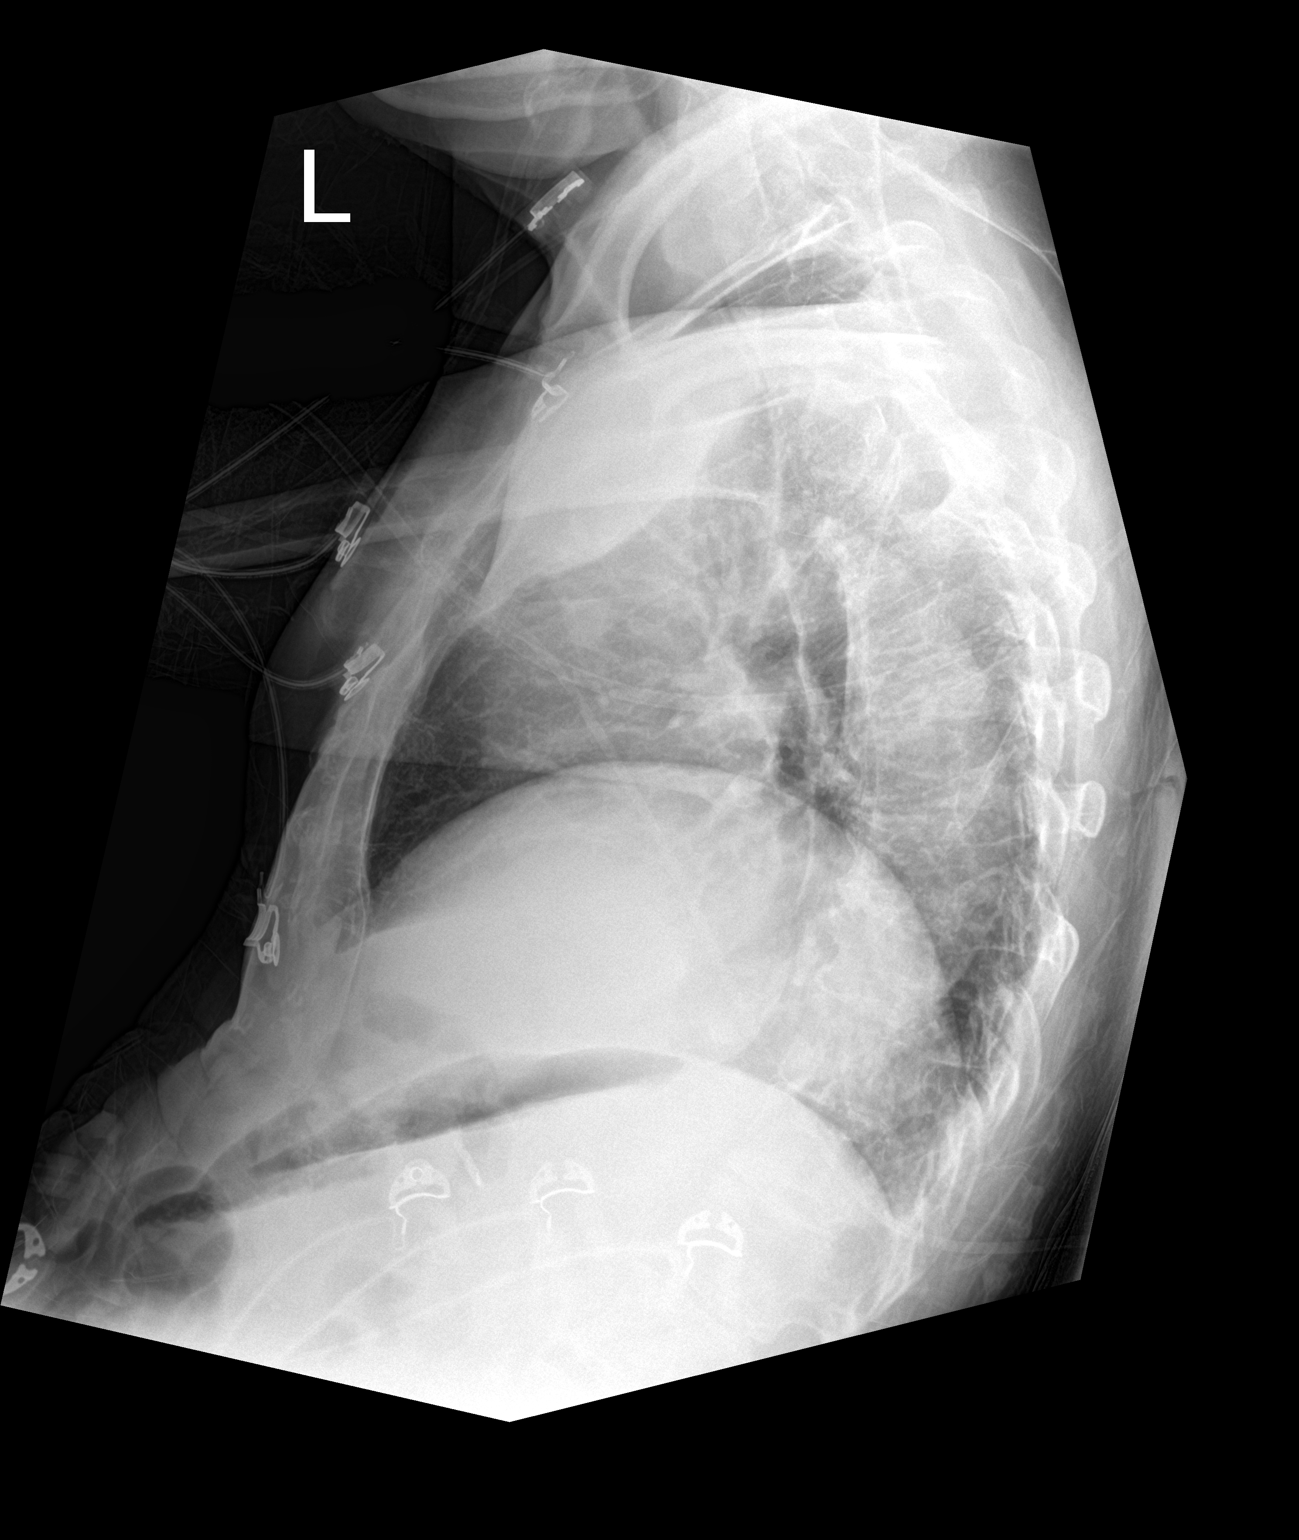

[chest ap]
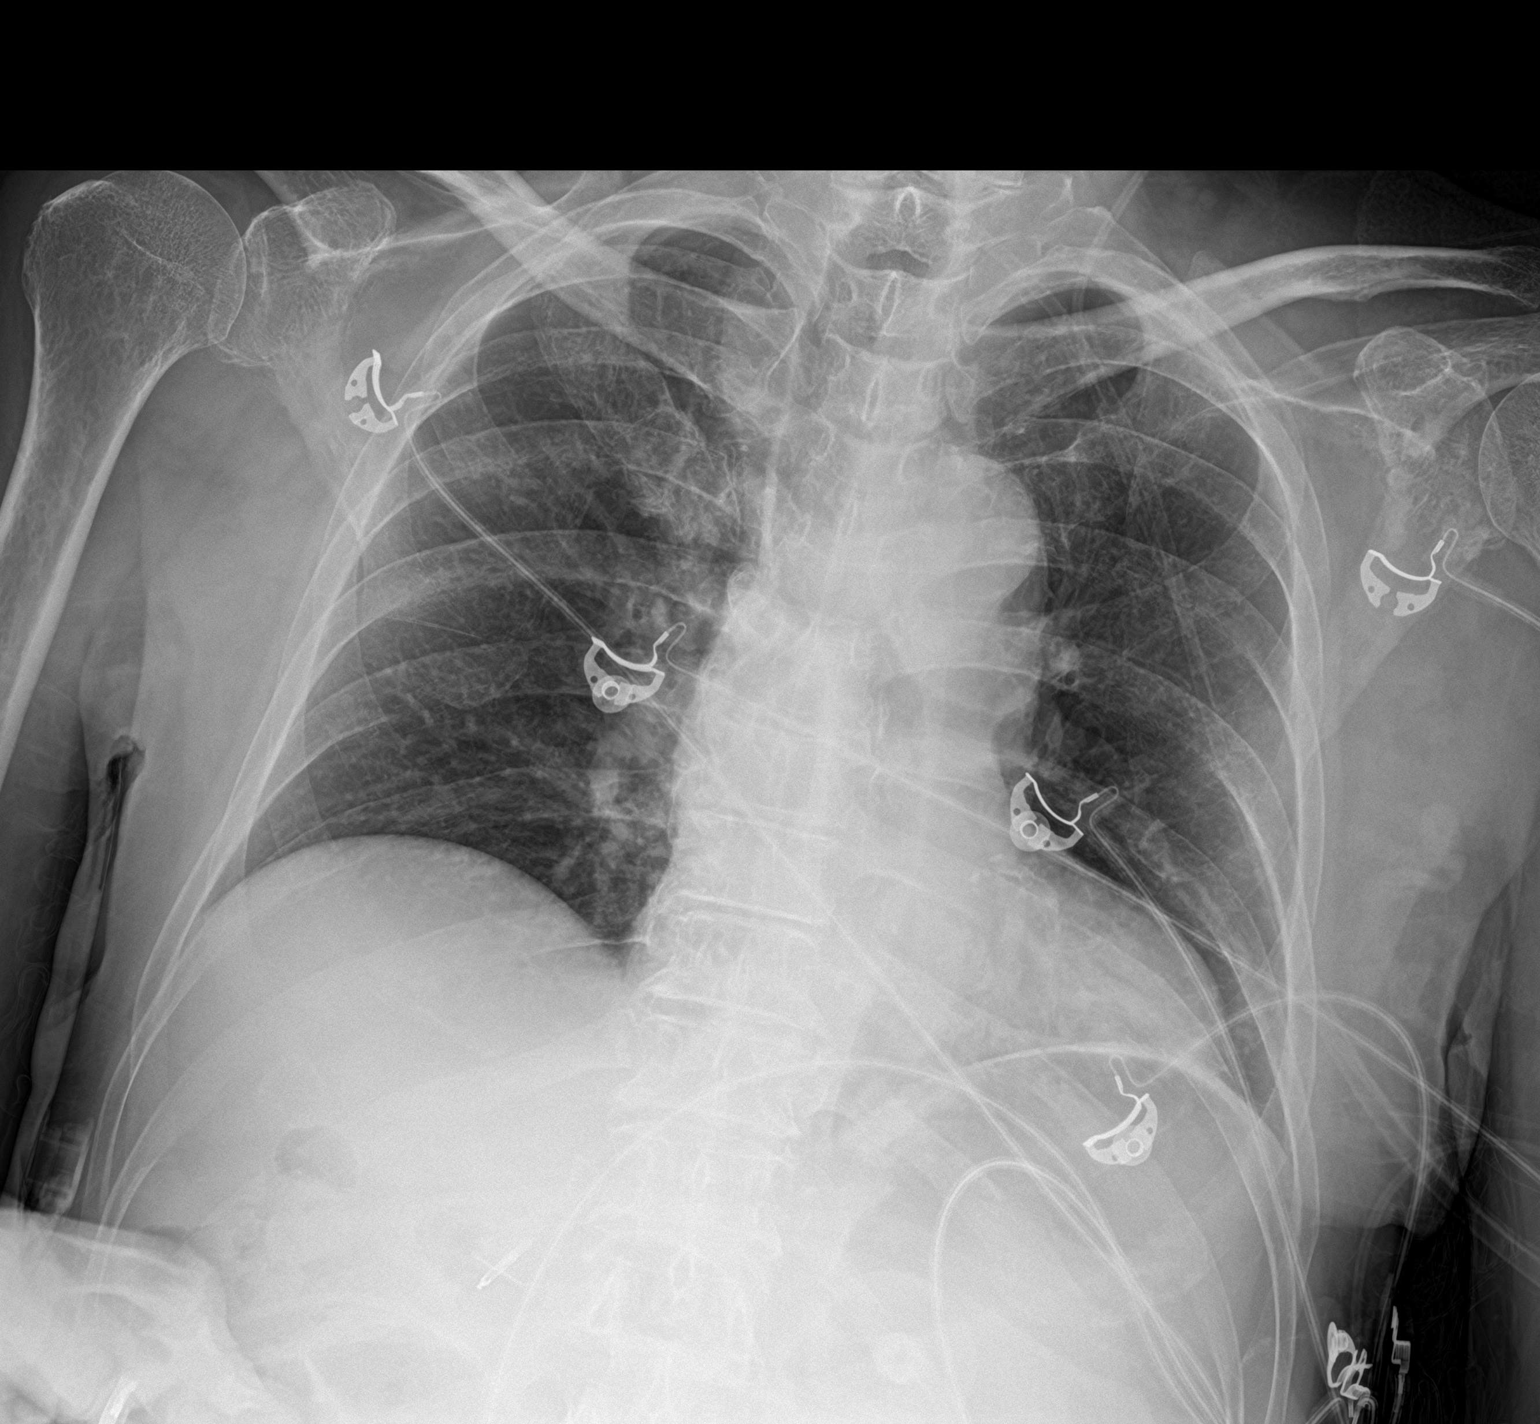

[2 of 2 positions shown; findings below may reference images not displayed]

FINDINGS: Low volume chest with interstitial crowding. Could not exclude mild
infiltrate or atelectasis in the retrocardiac lung. No edema,
effusion, or pneumothorax. Normal heart size.Apparent percutaneous
gastrostomy tube. The stomach is likely distended by gas and fluid.
Hemostatic clips versus cholecystectomy clips overlap the right
upper quadrant.
IMPRESSION: Mild atelectasis or infiltrate in the retrocardiac lung.
